# Patient Record
Sex: Male | Born: 1950 | Race: White | Hispanic: No | Marital: Married | Smoking: Never smoker
Health system: Southern US, Community
[De-identification: ages and names within clinical notes are randomized; demographics above are authoritative.]

## PROBLEM LIST (undated history)

## (undated) DIAGNOSIS — H04123 Dry eye syndrome of bilateral lacrimal glands: Secondary | ICD-10-CM

## (undated) DIAGNOSIS — I10 Essential (primary) hypertension: Secondary | ICD-10-CM

## (undated) DIAGNOSIS — K219 Gastro-esophageal reflux disease without esophagitis: Secondary | ICD-10-CM

## (undated) DIAGNOSIS — N189 Chronic kidney disease, unspecified: Secondary | ICD-10-CM

## (undated) DIAGNOSIS — E785 Hyperlipidemia, unspecified: Secondary | ICD-10-CM

## (undated) DIAGNOSIS — E559 Vitamin D deficiency, unspecified: Secondary | ICD-10-CM

## (undated) DIAGNOSIS — M1990 Unspecified osteoarthritis, unspecified site: Secondary | ICD-10-CM

## (undated) HISTORY — DX: Vitamin D deficiency, unspecified: E55.9

## (undated) HISTORY — PX: COLONOSCOPY W/ POLYPECTOMY: SHX1380

## (undated) HISTORY — DX: Hyperlipidemia, unspecified: E78.5

## (undated) HISTORY — DX: Essential (primary) hypertension: I10

## (undated) HISTORY — DX: Gastro-esophageal reflux disease without esophagitis: K21.9

---

## 1988-12-09 HISTORY — PX: ESOPHAGOGASTRODUODENOSCOPY: SHX1529

## 2003-01-05 ENCOUNTER — Ambulatory Visit (HOSPITAL_COMMUNITY): Admission: RE | Admit: 2003-01-05 | Discharge: 2003-01-05 | Payer: Self-pay | Admitting: Gastroenterology

## 2003-01-05 ENCOUNTER — Encounter (INDEPENDENT_AMBULATORY_CARE_PROVIDER_SITE_OTHER): Payer: Self-pay | Admitting: Specialist

## 2005-02-01 ENCOUNTER — Emergency Department (HOSPITAL_COMMUNITY): Admission: EM | Admit: 2005-02-01 | Discharge: 2005-02-01 | Payer: Self-pay | Admitting: Emergency Medicine

## 2005-03-08 ENCOUNTER — Ambulatory Visit (HOSPITAL_COMMUNITY): Admission: RE | Admit: 2005-03-08 | Discharge: 2005-03-08 | Payer: Self-pay | Admitting: Family Medicine

## 2008-02-24 ENCOUNTER — Ambulatory Visit (HOSPITAL_COMMUNITY): Admission: RE | Admit: 2008-02-24 | Discharge: 2008-02-24 | Payer: Self-pay | Admitting: Internal Medicine

## 2011-04-26 NOTE — Op Note (Signed)
   NAME:  [name] [name], [name] [name]                      ACCOUNT NO.:  [id number]   MEDICAL RECORD NO.:  [id number]                   PATIENT TYPE:  AMB   LOCATION:  ENDO                                 FACILITY:  [facility]   PHYSICIAN:  Danise Edge, M.D.                DATE OF BIRTH:  1951/09/28   DATE OF PROCEDURE:  01/05/2003  DATE OF DISCHARGE:                                 OPERATIVE REPORT   REFERRING PHYSICIAN:  Theressa Millard, M.D.   PROCEDURE:  Colonoscopy with polypectomy.   PROCEDURE INDICATION:  Mr. Jerrel Tiberio is a 60 year old male born  11/02/1951.  Mr. Portnoy is scheduled to undergo his first screening  colonoscopy with polypectomy to prevent colon cancer.  I discussed with Mr.  Fieldhouse the complications associated with colonoscopy and polypectomy,  including 15/1000 risk of bleeding and 1/000 risk of colon perforation.  Mr.  Landress has signed the operative permit.   ENDOSCOPIST:  Danise Edge, M.D.   PREMEDICATION:  Versed 7.5 mg, Demerol 50 mg.   ENDOSCOPE:  Olympus pediatric colonoscope.   DESCRIPTION OF PROCEDURE:  After obtaining informed consent, Mr. Renaldo  was placed in the left lateral decubitus position.  I administered  intravenous Demerol and intravenous Versed to achieve conscious sedation for  the procedure.  The patient's blood pressure, oxygen saturation, and cardiac  rhythm were monitored throughout the procedure and documented in the medical  record.   Anal inspection was normal, digital rectal exam revealed a slightly enlarged  but non-nodular prostate.  The Olympus pediatric video colonoscope was  introduced into the rectum and advanced to the cecum.  The colonic  preparation for the exam today was excellent.   Rectum normal.   Sigmoid colon and descending colon normal.   Splenic flexure normal.   Transverse colon normal.   Hepatic flexure:  From the hepatic flexure a 3-mm sessile polyp was lifted  by submucosal saline injection and  removed with both the electrocautery  snare and hot biopsy forceps.   Ascending colon normal.   Cecum and ileocecal valve normal.   ASSESSMENT:  A 3-mm sessile polyp was removed from the hepatic flexure;  otherwise normal proctocolonoscopy to the cecum.   RECOMMENDATION:  If polyp returns neoplastic, Mr. Cherne should undergo a  repeat colonoscopy in 2-5 years.                                                Danise Edge, M.D.    MJ/MEDQ  D:  01/05/2003  T:  01/05/2003  Job:  [id number]   cc:   Theressa Millard, M.D.  [address] [place]  [place] [address]  Fax: [phone]

## 2014-02-28 ENCOUNTER — Other Ambulatory Visit: Payer: Self-pay | Admitting: *Deleted

## 2014-02-28 MED ORDER — FINASTERIDE 5 MG PO TABS: 5.0000 mg | ORAL_TABLET | Freq: Every day | ORAL | Status: AC

## 2014-02-28 MED ORDER — ATENOLOL 100 MG PO TABS: 100.0000 mg | ORAL_TABLET | Freq: Every day | ORAL | Status: DC

## 2014-02-28 MED ORDER — LISINOPRIL-HYDROCHLOROTHIAZIDE 20-25 MG PO TABS: 1.0000 | ORAL_TABLET | Freq: Every day | ORAL | Status: DC

## 2014-03-22 ENCOUNTER — Encounter: Payer: Self-pay | Admitting: Physician Assistant

## 2014-03-22 ENCOUNTER — Ambulatory Visit (INDEPENDENT_AMBULATORY_CARE_PROVIDER_SITE_OTHER): Payer: BC Managed Care – PPO | Admitting: Physician Assistant

## 2014-03-22 VITALS — BP 120/80 | HR 64 | Temp 97.9°F | Resp 16 | Ht 67.0 in | Wt 159.0 lb

## 2014-03-22 DIAGNOSIS — L309 Dermatitis, unspecified: Secondary | ICD-10-CM

## 2014-03-22 DIAGNOSIS — E559 Vitamin D deficiency, unspecified: Secondary | ICD-10-CM | POA: Insufficient documentation

## 2014-03-22 DIAGNOSIS — L259 Unspecified contact dermatitis, unspecified cause: Secondary | ICD-10-CM

## 2014-03-22 DIAGNOSIS — I10 Essential (primary) hypertension: Secondary | ICD-10-CM | POA: Insufficient documentation

## 2014-03-22 DIAGNOSIS — E785 Hyperlipidemia, unspecified: Secondary | ICD-10-CM | POA: Insufficient documentation

## 2014-03-22 DIAGNOSIS — K219 Gastro-esophageal reflux disease without esophagitis: Secondary | ICD-10-CM | POA: Insufficient documentation

## 2014-03-22 MED ORDER — PREDNISONE 20 MG PO TABS
ORAL_TABLET | ORAL | Status: DC
Start: 2014-03-22 — End: 2014-03-22

## 2014-03-22 MED ORDER — TRIAMCINOLONE ACETONIDE 0.1 % EX CREA: 1.0000 "application " | TOPICAL_CREAM | Freq: Two times a day (BID) | CUTANEOUS | Status: DC

## 2014-03-22 MED ORDER — DEXAMETHASONE SODIUM PHOSPHATE 10 MG/ML IJ SOLN
10.0000 mg | Freq: Once | INTRAMUSCULAR | Status: AC
  Administered 2014-03-22: 10 mg via INTRAMUSCULAR

## 2014-03-22 MED ORDER — PREDNISONE 20 MG PO TABS: ORAL_TABLET | ORAL | Status: DC

## 2014-03-22 NOTE — Patient Instructions (Signed)
Eczema Eczema, also called atopic dermatitis, is a skin disorder that causes inflammation of the skin. It causes a red rash and dry, scaly skin. The skin becomes very itchy. Eczema is generally worse during the cooler winter months and often improves with the warmth of summer. Eczema usually starts showing signs in infancy. Some children outgrow eczema, but it may last through adulthood.  CAUSES  The exact cause of eczema is not known, but it appears to run in families. People with eczema often have a family history of eczema, allergies, asthma, or hay fever. Eczema is not contagious. Flare-ups of the condition may be caused by:   Contact with something you are sensitive or allergic to.   Stress. SIGNS AND SYMPTOMS  Dry, scaly skin.   Red, itchy rash.   Itchiness. This may occur before the skin rash and may be very intense.  DIAGNOSIS  The diagnosis of eczema is usually made based on symptoms and medical history. TREATMENT  Eczema cannot be cured, but symptoms usually can be controlled with treatment and other strategies. A treatment plan might include:  Controlling the itching and scratching.   Use over-the-counter antihistamines as directed for itching. This is especially useful at night when the itching tends to be worse.   Use over-the-counter steroid creams as directed for itching.   Avoid scratching. Scratching makes the rash and itching worse. It may also result in a skin infection (impetigo) due to a break in the skin caused by scratching.   Keeping the skin well moisturized with creams every day. This will seal in moisture and help prevent dryness. Lotions that contain alcohol and water should be avoided because they can dry the skin.   Limiting exposure to things that you are sensitive or allergic to (allergens).   Recognizing situations that cause stress.   Developing a plan to manage stress.  HOME CARE INSTRUCTIONS   Only take over-the-counter or  prescription medicines as directed by your health care provider.   Do not use anything on the skin without checking with your health care provider.   Keep baths or showers short (5 minutes) in warm (not hot) water. Use mild cleansers for bathing. These should be unscented. You may add nonperfumed bath oil to the bath water. It is best to avoid soap and bubble bath.   Immediately after a bath or shower, when the skin is still damp, apply a moisturizing ointment to the entire body. This ointment should be a petroleum ointment. This will seal in moisture and help prevent dryness. The thicker the ointment, the better. These should be unscented.   Keep fingernails cut short. Children with eczema may need to wear soft gloves or mittens at night after applying an ointment.   Dress in clothes made of cotton or cotton blends. Dress lightly, because heat increases itching.   A child with eczema should stay away from anyone with fever blisters or cold sores. The virus that causes fever blisters (herpes simplex) can cause a serious skin infection in children with eczema. SEEK MEDICAL CARE IF:   Your itching interferes with sleep.   Your rash gets worse or is not better within 1 week after starting treatment.   You see pus or soft yellow scabs in the rash area.   You have a fever.   You have a rash flare-up after contact with someone who has fever blisters.  Document Released: 11/22/2000 Document Revised: 09/15/2013 Document Reviewed: 06/28/2013 [employer] Patient Information 2014 [employer].  

## 2014-03-22 NOTE — Progress Notes (Signed)
   Subjective:    Patient ID: [name] [name], male    DOB: 10/14/1951, 63 y.o.   MRN: [id number]  Rash   63 y.o. normally gets dry skin in the winter but for the last 2-3 months he has had a rash. Started on bilateral elbows, right anterior shin and right posterior thigh. Very puritic, worse mild of the night.  History of joint pain. He has also has some fatigue recently due to allergies and some URI. Denies new soaps, being in the woods, history of rashes, new medications.    Review of Systems  Constitutional: Negative.   HENT: Negative.   Eyes: Negative.   Respiratory: Negative.   Cardiovascular: Negative.   Gastrointestinal: Negative.   Genitourinary: Negative.   Musculoskeletal: Positive for arthralgias. Negative for back pain, gait problem, joint swelling, myalgias, neck pain and neck stiffness.  Skin: Positive for rash.  Allergic/Immunologic: Positive for environmental allergies. Negative for food allergies and immunocompromised state.  Neurological: Negative.   Hematological: Negative.        Objective:   Physical Exam  Constitutional: He is oriented to person, place, and time. He appears well-developed and well-nourished.  HENT:  Head: Normocephalic and atraumatic.  Right Ear: External ear normal.  Left Ear: External ear normal.  Mouth/Throat: Oropharynx is clear and moist.  Eyes: Conjunctivae and EOM are normal. Pupils are equal, round, and reactive to light.  Neck: Normal range of motion. Neck supple.  Cardiovascular: Normal rate, regular rhythm and normal heart sounds.   Pulmonary/Chest: Effort normal and breath sounds normal.  Abdominal: Soft. Bowel sounds are normal.  Musculoskeletal: Normal range of motion.  Neurological: He is alert and oriented to person, place, and time. No cranial nerve deficit.  Skin: Skin is warm and dry.  On bilateral elbows, right anterior shin, and right posterior thigh erythematous plaques with silvery scales.   Psychiatric: He has  a normal mood and affect. His behavior is normal.       Assessment & Plan:  Dermatitis-? Eczema with recent allergies- prednisone shot and pills, triamcinolone topical, if it returns we will get a shave biopsy to evaluate for psoriasis and send to derm.

## 2014-03-27 ENCOUNTER — Encounter: Payer: Self-pay | Admitting: Internal Medicine

## 2014-03-27 DIAGNOSIS — R7309 Other abnormal glucose: Secondary | ICD-10-CM | POA: Insufficient documentation

## 2014-03-27 NOTE — Progress Notes (Signed)
Patient ID: [name] [name], male   DOB: 02/14/1951, 63 y.o.   MRN: [id number]    This very nice 63 y.o. DWM presents for 3 month follow up with Hypertension, Hyperlipidemia, Pre-Diabetes and Vitamin D Deficiency.    HTN predates since   . BP has been controlled at home. Today's BP: 106/72 mmHg . Patient denies any cardiac type chest pain, palpitations, dyspnea/orthopnea/PND, dizziness, claudication, or dependent edema.   Hyperlipidemia is controlled with diet. Last Cholesterol was 183, Triglycerides were 308, HDL 54 and LDL 67 in Oct 2014. Patient denies myalgias or other med SE's.    Also, the patient is screened for PreDiabetes/insulin resistance with last A1c of 5.1% and elevated insulin of 47 in Oct 2014 suggesting insulin resistance. Patient denies any symptoms of reactive hypoglycemia, diabetic polys, paresthesias or visual blurring.   Further, Patient has history of Vitamin D Deficiency of 28 in 2008 with last vitamin D of 81 in Oct 2014. Patient supplements vitamin D without any suspected side-effects.  Current Outpatient Prescriptions on File Prior to Visit  Medication Sig Dispense Refill  . atenolol (TENORMIN) 100 MG tablet Take 1 tablet (100 mg total) by mouth daily.  90 tablet  1  . BABY ASPIRIN PO Take 81 mg by mouth daily.      . celecoxib (CELEBREX) 200 MG capsule Take 200 mg by mouth daily.      . Cholecalciferol (VITAMIN D PO) Take by mouth daily.      . CycloSPORINE (RESTASIS OP) Apply to eye daily.      Marland Kitchen. esomeprazole (NEXIUM) 40 MG capsule Take 40 mg by mouth daily at 12 noon.      . finasteride (PROSCAR) 5 MG tablet Take 1 tablet (5 mg total) by mouth daily.  90 tablet  1  . lisinopril-hydrochlorothiazide (PRINZIDE,ZESTORETIC) 20-25 MG per tablet Take 1 tablet by mouth daily.  90 tablet  1  . Omega-3 Fatty Acids (FISH OIL PO) Take by mouth daily.      . pravastatin (PRAVACHOL) 40 MG tablet Take 40 mg by mouth daily.      . predniSONE (DELTASONE) 20 MG tablet Take one  pill two times daily for 3 days, take one pill daily for 4 days.  10 tablet  0  . triamcinolone cream (KENALOG) 0.1 % Apply 1 application topically 2 (two) times daily.  85.2 g  2  . valACYclovir (VALTREX) 500 MG tablet Take 500 mg by mouth 3 (three) times daily as needed.       No current facility-administered medications on file prior to visit.     No Known Allergies  PMHx:   Past Medical History  Diagnosis Date  . Hypertension   . Hyperlipidemia   . GERD (gastroesophageal reflux disease)   . Vitamin D deficiency     FHx:    Reviewed / unchanged  SHx:    Reviewed / unchanged   Systems Review: Constitutional: Denies fever, chills, wt changes, headaches, insomnia, fatigue, night sweats, change in appetite. Eyes: Denies redness, blurred vision, diplopia, discharge, itchy, watery eyes.  ENT: Denies discharge, congestion, post nasal drip, epistaxis, sore throat, earache, hearing loss, dental pain, tinnitus, vertigo, sinus pain, snoring.  CV: Denies chest pain, palpitations, irregular heartbeat, syncope, dyspnea, diaphoresis, orthopnea, PND, claudication, edema. Respiratory: denies cough, dyspnea, DOE, pleurisy, hoarseness, laryngitis, wheezing.  Gastrointestinal: Denies dysphagia, odynophagia, heartburn, reflux, water brash, abdominal pain or cramps, nausea, vomiting, bloating, diarrhea, constipation, hematemesis, melena, hematochezia,  or hemorrhoids. Genitourinary: Denies dysuria, frequency, urgency,  nocturia, hesitancy, discharge, hematuria, flank pain. Musculoskeletal: Denies arthralgias, myalgias, stiffness, jt. swelling, pain, limp, strain/sprain.  Skin: Denies pruritus, rash, hives, warts, acne, eczema, change in skin lesion(s). Neuro: No weakness, tremor, incoordination, spasms, paresthesia, or pain. Psychiatric: Denies confusion, memory loss, or sensory loss. Endo: Denies change in weight, skin, hair change.  Heme/Lymph: No excessive bleeding, bruising, orenlarged lymph  nodes.   Exam:  BP 106/72  Pulse 60  Temp(Src) 98.2 F (36.8 C) (Temporal)  Resp 16  Ht 5\' 7"  (1.702 m)  Wt 156 lb 6.4 oz (70.943 kg)  BMI 24.49 kg/m2  Appears well nourished - in no distress. Eyes: PERRLA, EOMs, conjunctiva no swelling or erythema. Sinuses: No frontal/maxillary tenderness ENT/Mouth: EAC's clear, TM's nl w/o erythema, bulging. Nares clear w/o erythema, swelling, exudates. Oropharynx clear without erythema or exudates. Oral hygiene is good. Tongue normal, non obstructing. Hearing intact.  Neck: Supple. Thyroid nl. Car 2+/2+ without bruits, nodes or JVD. Chest: Respirations nl with BS clear & equal w/o rales, rhonchi, wheezing or stridor.  Cor: Heart sounds normal w/ regular rate and rhythm without sig. murmurs, gallops, clicks, or rubs. Peripheral pulses normal and equal  without edema.  Abdomen: Soft & bowel sounds normal. Non-tender w/o guarding, rebound, hernias, masses, or organomegaly.  Lymphatics: Unremarkable.  Musculoskeletal: Full ROM all peripheral extremities, joint stability, 5/5 strength, and normal gait.  Skin: Warm, dry without exposed rashes, lesions, ecchymosis apparent.  Neuro: Cranial nerves intact, reflexes equal bilaterally. Sensory-motor testing grossly intact. Tendon reflexes grossly intact.  Pysch: Alert & oriented x 3. Insight and judgement nl & appropriate. No ideations.  Assessment and Plan:  1. Hypertension - Continue monitor blood pressure at home. Continue diet/meds same.  2. Hyperlipidemia - Continue diet/meds, exercise,& lifestyle modifications. Continue monitor periodic cholesterol/liver & renal functions   3. Pre-diabetes/Insulin Resistance - Continue diet, exercise, lifestyle modifications. Monitor appropriate labs.  4. Vitamin D Deficiency - Continue supplementation.  Recommended regular exercise, BP monitoring, weight control, and discussed med and SE's. Recommended labs to assess and monitor clinical status. Further  disposition pending results of labs.

## 2014-03-27 NOTE — Patient Instructions (Addendum)
Hypertension As your heart beats, it forces blood through your arteries. This force is your blood pressure. If the pressure is too high, it is called hypertension (HTN) or high blood pressure. HTN is dangerous because you may have it and not know it. High blood pressure may mean that your heart has to work harder to pump blood. Your arteries may be narrow or stiff. The extra work puts you at risk for heart disease, stroke, and other problems.  Blood pressure consists of two numbers, a higher number over a lower, 110/72, for example. It is stated as "110 over 72." The ideal is below 120 for the top number (systolic) and under 80 for the bottom (diastolic). Write down your blood pressure today. You should pay close attention to your blood pressure if you have certain conditions such as:  Heart failure.  Prior heart attack.  Diabetes  Chronic kidney disease.  Prior stroke.  Multiple risk factors for heart disease. To see if you have HTN, your blood pressure should be measured while you are seated with your arm held at the level of the heart. It should be measured at least twice. A one-time elevated blood pressure reading (especially in the Emergency Department) does not mean that you need treatment. There may be conditions in which the blood pressure is different between your right and left arms. It is important to see your caregiver soon for a recheck. Most people have essential hypertension which means that there is not a specific cause. This type of high blood pressure may be lowered by changing lifestyle factors such as:  Stress.  Smoking.  Lack of exercise.  Excessive weight.  Drug/tobacco/alcohol use.  Eating less salt. Most people do not have symptoms from high blood pressure until it has caused damage to the body. Effective treatment can often prevent, delay or reduce that damage. TREATMENT  When a cause has been identified, treatment for high blood pressure is directed at  the cause. There are a large number of medications to treat HTN. These fall into several categories, and your caregiver will help you select the medicines that are best for you. Medications may have side effects. You should review side effects with your caregiver. If your blood pressure stays high after you have made lifestyle changes or started on medicines,   Your medication(s) may need to be changed.  Other problems may need to be addressed.  Be certain you understand your prescriptions, and know how and when to take your medicine.  Be sure to follow up with your caregiver within the time frame advised (usually within two weeks) to have your blood pressure rechecked and to review your medications.  If you are taking more than one medicine to lower your blood pressure, make sure you know how and at what times they should be taken. Taking two medicines at the same time can result in blood pressure that is too low. SEEK IMMEDIATE MEDICAL CARE IF:  You develop a severe headache, blurred or changing vision, or confusion.  You have unusual weakness or numbness, or a faint feeling.  You have severe chest or abdominal pain, vomiting, or breathing problems. MAKE SURE YOU:   Understand these instructions.  Will watch your condition.  Will get help right away if you are not doing well or get worse.   Cholesterol Cholesterol is a white, waxy, fat-like protein needed by your body in small amounts. The liver makes all the cholesterol you need. It is carried from the  liver by the blood through the blood vessels. Deposits (plaque) may build up on blood vessel walls. This makes the arteries narrower and stiffer. Plaque increases the risk for heart attack and stroke. You cannot feel your cholesterol level even if it is very high. The only way to know is by a blood test to check your lipid (fats) levels. Once you know your cholesterol levels, you should keep a record of the test results. Work with your  caregiver to to keep your levels in the desired range. WHAT THE RESULTS MEAN:  Total cholesterol is a rough measure of all the cholesterol in your blood.  LDL is the so-called bad cholesterol. This is the type that deposits cholesterol in the walls of the arteries. You want this level to be low.  HDL is the good cholesterol because it cleans the arteries and carries the LDL away. You want this level to be high.  Triglycerides are fat that the body can either burn for energy or store. High levels are closely linked to heart disease. DESIRED LEVELS:  Total cholesterol below 200.  LDL below 100 for people at risk, below 70 for very high risk.  HDL above 50 is good, above 60 is best.  Triglycerides below 150. HOW TO LOWER YOUR CHOLESTEROL:  Diet.  Choose fish or white meat chicken and [place], roasted or baked. Limit fatty cuts of red meat, fried foods, and processed meats, such as sausage and lunch meat.  Eat lots of fresh fruits and vegetables. Choose whole grains, beans, pasta, potatoes and cereals.  Use only small amounts of olive, corn or canola oils. Avoid butter, mayonnaise, shortening or palm kernel oils. Avoid foods with trans-fats.  Use skim/nonfat milk and low-fat/nonfat yogurt and cheeses. Avoid whole milk, cream, ice cream, egg yolks and cheeses. Healthy desserts include angel food cake, ginger snaps, animal crackers, hard candy, popsicles, and low-fat/nonfat frozen yogurt. Avoid pastries, cakes, pies and cookies.  Exercise.  A regular program helps decrease LDL and raises HDL.  Helps with weight control.  Do things that increase your activity level like gardening, walking, or taking the stairs.  Medication.  May be prescribed by your caregiver to help lowering cholesterol and the risk for heart disease.  You may need medicine even if your levels are normal if you have several risk factors. HOME CARE INSTRUCTIONS   Follow your diet and exercise programs as  suggested by your caregiver.  Take medications as directed.  Have blood work done when your caregiver feels it is necessary. MAKE SURE YOU:   Understand these instructions.  Will watch your condition.  Will get help right away if you are not doing well or get worse.      Vitamin D Deficiency Vitamin D is an important vitamin that your body needs. Having too little of it in your body is called a deficiency. A very bad deficiency can make your bones soft and can cause a condition called rickets.  Vitamin D is important to your body for different reasons, such as:   It helps your body absorb 2 minerals called calcium and phosphorus.  It helps make your bones healthy.  It may prevent some diseases, such as diabetes and multiple sclerosis.  It helps your muscles and heart. You can get vitamin D in several ways. It is a natural part of some foods. The vitamin is also added to some dairy products and cereals. Some people take vitamin D supplements. Also, your body makes vitamin D when  you are in the sun. It changes the sun's rays into a form of the vitamin that your body can use. CAUSES   Not eating enough foods that contain vitamin D.  Not getting enough sunlight.  Having certain digestive system diseases that make it hard to absorb vitamin D. These diseases include Crohn's disease, chronic pancreatitis, and cystic fibrosis.  Having a surgery in which part of the stomach or small intestine is removed.  Being obese. Fat cells pull vitamin D out of your blood. That means that obese people may not have enough vitamin D left in their blood and in other body tissues.  Having chronic kidney or liver disease. RISK FACTORS Risk factors are things that make you more likely to develop a vitamin D deficiency. They include:  Being older.  Not being able to get outside very much.  Living in a nursing home.  Having had broken bones.  Having weak or thin bones  (osteoporosis).  Having a disease or condition that changes how your body absorbs vitamin D.  Having dark skin.  Some medicines such as seizure medicines or steroids.  Being overweight or obese. SYMPTOMS Mild cases of vitamin D deficiency may not have any symptoms. If you have a very bad case, symptoms may include:  Bone pain.  Muscle pain.  Falling often.  Broken bones caused by a minor injury, due to osteoporosis. DIAGNOSIS A blood test is the best way to tell if you have a vitamin D deficiency. TREATMENT Vitamin D deficiency can be treated in different ways. Treatment for vitamin D deficiency depends on what is causing it. Options include:  Taking vitamin D supplements.  Taking a calcium supplement. Your caregiver will suggest what dose is best for you. HOME CARE INSTRUCTIONS  Take any supplements that your caregiver prescribes. Follow the directions carefully. Take only the suggested amount.  Have your blood tested 2 months after you start taking supplements.  Eat foods that contain vitamin D. Healthy choices include:  Fortified dairy products, cereals, or juices. Fortified means vitamin D has been added to the food. Check the label on the package to be sure.  Fatty fish like salmon or trout.  Eggs.  Oysters.  Do not use a tanning bed.  Keep your weight at a healthy level. Lose weight if you need to.  Keep all follow-up appointments. Your caregiver will need to perform blood tests to make sure your vitamin D deficiency is going away. SEEK MEDICAL CARE IF:  You have any questions about your treatment.  You continue to have symptoms of vitamin D deficiency.  You have nausea or vomiting.  You are constipated.  You feel confused.  You have severe abdominal or back pain. MAKE SURE YOU:  Understand these instructions.  Will watch your condition.  Will get help right away if you are not doing well or get worse.     Insulin Resistance Blood sugar  (glucose) levels are controlled by a hormone called insulin. Insulin is made by your pancreas. When your blood glucose goes up, insulin is released into your blood. Insulin is required for your body to function normally. However, your body can become resistant to your own insulin or to insulin given to treat diabetes. In either case, insulin resistance can lead to serious problems. These problems include:  Type 2 diabetes.  Heart disease.  High blood pressure.  Stroke.  Polycystic ovary syndrome.  Fatty liver. CAUSES  Insulin resistance can develop for many different reasons. It is  more likely to happen in people with these conditions or characteristics:  Obesity.  Inactivity.  Pregnancy.  High blood pressure.  Stress.  Steroid use.  Infection or severe illness.  Increased levels of cholesterol and triglycerides. SYMPTOMS  There are no symptoms. You may have symptoms related to the various complications of insulin resistance.  DIAGNOSIS  Several different things can make your caregiver suspect you have insulin resistance. These include:  High blood glucose (hyperglycemia).  Abnormal cholesterol levels.  High uric acid levels.  Changes related to blood pressure.  Changes related to inflammation. Insulin resistance can be determined with blood tests. An elevated insulin level when you have not eaten might suggest resistance. Other more complicated tests are sometimes necessary. TREATMENT  Lifestyle changes are the most important treatment for insulin resistance.   If you are overweight and you have insulin resistance, you can improve your insulin sensitivity by losing weight.  Moderate exercise for 30 40 minutes, 4 days a week, can improve insulin sensitivity. Some medicines can also help improve your insulin sensitivity. Your caregiver can discuss these with you if they are appropriate.  HOME CARE INSTRUCTIONS   Do not smoke.  Keep your weight at a healthy  level.  Get exercise.  If you have diabetes, follow your caregiver's directions.  If you have high blood pressure, follow your caregiver's directions.  Only take prescription medicines for pain, fever, or discomfort as directed by your caregiver. SEEK MEDICAL CARE IF:   You are diabetic and you are having problems keeping your blood glucose levels at target range.  You are having episodes of low blood glucose (hypoglycemia).  You feel you might be having side effects from your medicines.  You have symptoms of an illness that is not improving after 3 4 days.  You have a sore or wound that is not healing.  You notice a change in vision or a new problem with your vision. SEEK IMMEDIATE MEDICAL CARE IF:   Your blood glucose goes below 70, especially if you have confusion, lightheadedness, or other symptoms with it.  Your blood glucose is very high (as advised by your caregiver) twice in a row.  You pass out.  You have chest pain or trouble breathing.  You have a sudden, severe headache.  You have sudden weakness in one arm or one leg.  You have sudden difficulty speaking or swallowing.  You develop vomiting or diarrhea that is getting worse or not improving after 1 day. Document Released: 01/14/2006 Document Revised: 05/26/2012 Document Reviewed: 05/06/2013 [facility]Exit[facility] Patient Information 2014 [place]Care, [place].

## 2014-03-28 ENCOUNTER — Other Ambulatory Visit: Payer: Self-pay | Admitting: Internal Medicine

## 2014-03-28 ENCOUNTER — Ambulatory Visit (INDEPENDENT_AMBULATORY_CARE_PROVIDER_SITE_OTHER): Payer: BC Managed Care – PPO | Admitting: Internal Medicine

## 2014-03-28 ENCOUNTER — Encounter: Payer: Self-pay | Admitting: Internal Medicine

## 2014-03-28 VITALS — BP 106/72 | HR 60 | Temp 98.2°F | Resp 16 | Ht 67.0 in | Wt 156.4 lb

## 2014-03-28 DIAGNOSIS — R7309 Other abnormal glucose: Secondary | ICD-10-CM

## 2014-03-28 DIAGNOSIS — E559 Vitamin D deficiency, unspecified: Secondary | ICD-10-CM

## 2014-03-28 DIAGNOSIS — I10 Essential (primary) hypertension: Secondary | ICD-10-CM

## 2014-03-28 DIAGNOSIS — E782 Mixed hyperlipidemia: Secondary | ICD-10-CM

## 2014-03-28 DIAGNOSIS — Z79899 Other long term (current) drug therapy: Secondary | ICD-10-CM

## 2014-03-28 DIAGNOSIS — E785 Hyperlipidemia, unspecified: Secondary | ICD-10-CM

## 2014-04-01 ENCOUNTER — Other Ambulatory Visit: Payer: BC Managed Care – PPO

## 2014-04-01 DIAGNOSIS — Z79899 Other long term (current) drug therapy: Secondary | ICD-10-CM

## 2014-04-01 DIAGNOSIS — E782 Mixed hyperlipidemia: Secondary | ICD-10-CM

## 2014-04-01 DIAGNOSIS — R7309 Other abnormal glucose: Secondary | ICD-10-CM

## 2014-04-01 DIAGNOSIS — E559 Vitamin D deficiency, unspecified: Secondary | ICD-10-CM

## 2014-04-01 DIAGNOSIS — I10 Essential (primary) hypertension: Secondary | ICD-10-CM

## 2014-04-01 LAB — HEPATIC FUNCTION PANEL
ALT: 31 U/L (ref 0–53)
AST: 22 U/L (ref 0–37)
Albumin: 4.1 g/dL (ref 3.5–5.2)
Alkaline Phosphatase: 45 U/L (ref 39–117)
Bilirubin, Direct: 0.1 mg/dL (ref 0.0–0.3)
Indirect Bilirubin: 0.6 mg/dL (ref 0.2–1.2)
Total Bilirubin: 0.7 mg/dL (ref 0.2–1.2)
Total Protein: 6.6 g/dL (ref 6.0–8.3)

## 2014-04-01 LAB — CBC WITH DIFFERENTIAL/PLATELET
Basophils Absolute: 0.1 10*3/uL (ref 0.0–0.1)
Basophils Relative: 1 % (ref 0–1)
Eosinophils Absolute: 0.2 10*3/uL (ref 0.0–0.7)
Eosinophils Relative: 2 % (ref 0–5)
HCT: 46.8 % (ref 39.0–52.0)
Hemoglobin: 16.7 g/dL (ref 13.0–17.0)
Lymphocytes Relative: 26 % (ref 12–46)
Lymphs Abs: 2.8 10*3/uL (ref 0.7–4.0)
MCH: 33.3 pg (ref 26.0–34.0)
MCHC: 35.7 g/dL (ref 30.0–36.0)
MCV: 93.2 fL (ref 78.0–100.0)
Monocytes Absolute: 1.3 10*3/uL — ABNORMAL HIGH (ref 0.1–1.0)
Monocytes Relative: 12 % (ref 3–12)
Neutro Abs: 6.3 10*3/uL (ref 1.7–7.7)
Neutrophils Relative %: 59 % (ref 43–77)
Platelets: 279 10*3/uL (ref 150–400)
RBC: 5.02 MIL/uL (ref 4.22–5.81)
RDW: 12.7 % (ref 11.5–15.5)
WBC: 10.6 10*3/uL — ABNORMAL HIGH (ref 4.0–10.5)

## 2014-04-01 LAB — LIPID PANEL
Cholesterol: 190 mg/dL (ref 0–200)
HDL: 49 mg/dL (ref >39)
LDL Cholesterol: 88 mg/dL (ref 0–99)
Total CHOL/HDL Ratio: 3.9 Ratio
Triglycerides: 267 mg/dL — ABNORMAL HIGH (ref <150)
VLDL: 53 mg/dL — ABNORMAL HIGH (ref 0–40)

## 2014-04-01 LAB — TSH: TSH: 1.871 u[IU]/mL (ref 0.350–4.500)

## 2014-04-01 LAB — BASIC METABOLIC PANEL WITH GFR
BUN: 27 mg/dL — ABNORMAL HIGH (ref 6–23)
CO2: 30 mEq/L (ref 19–32)
Calcium: 9.5 mg/dL (ref 8.4–10.5)
Chloride: 96 mEq/L (ref 96–112)
Creat: 1.22 mg/dL (ref 0.50–1.35)
GFR, Est African American: 74 mL/min
GFR, Est Non African American: 64 mL/min
Glucose, Bld: 82 mg/dL (ref 70–99)
Potassium: 4.4 mEq/L (ref 3.5–5.3)
Sodium: 135 mEq/L (ref 135–145)

## 2014-04-01 LAB — MAGNESIUM: Magnesium: 2.2 mg/dL (ref 1.5–2.5)

## 2014-04-02 LAB — VITAMIN D 25 HYDROXY (VIT D DEFICIENCY, FRACTURES): Vit D, 25-Hydroxy: 79 ng/mL (ref 30–89)

## 2014-04-02 LAB — HEMOGLOBIN A1C
Hgb A1c MFr Bld: 5.4 % (ref <5.7)
Mean Plasma Glucose: 108 mg/dL (ref <117)

## 2014-04-02 LAB — INSULIN, FASTING: Insulin fasting, serum: 9 u[IU]/mL (ref 3–28)

## 2014-04-04 ENCOUNTER — Other Ambulatory Visit: Payer: Self-pay | Admitting: *Deleted

## 2014-04-04 MED ORDER — ESOMEPRAZOLE MAGNESIUM 40 MG PO CPDR: 40.0000 mg | DELAYED_RELEASE_CAPSULE | Freq: Every day | ORAL | Status: DC

## 2014-05-19 ENCOUNTER — Ambulatory Visit (INDEPENDENT_AMBULATORY_CARE_PROVIDER_SITE_OTHER): Payer: BC Managed Care – PPO | Admitting: Internal Medicine

## 2014-05-19 ENCOUNTER — Encounter: Payer: Self-pay | Admitting: Internal Medicine

## 2014-05-19 VITALS — BP 106/68 | HR 60 | Temp 97.9°F | Resp 16 | Ht 67.0 in | Wt 159.4 lb

## 2014-05-19 DIAGNOSIS — B354 Tinea corporis: Secondary | ICD-10-CM

## 2014-05-19 MED ORDER — KETOCONAZOLE 2 % EX CREA: TOPICAL_CREAM | CUTANEOUS | Status: DC

## 2014-05-19 NOTE — Patient Instructions (Signed)
Body Ringworm °Ringworm (tinea corporis) is a fungal infection of the skin on the body. This infection is not caused by worms, but is actually caused by a fungus. Fungus normally lives on the top of your skin and can be useful. However, in the case of ringworms, the fungus grows out of control and causes a skin infection. It can involve any area of skin on the body and can spread easily from one person to another (contagious). Ringworm is a common problem for children, but it can affect adults as well. Ringworm is also often found in athletes, especially wrestlers who share equipment and mats.  °CAUSES  °Ringworm of the body is caused by a fungus called dermatophyte. It can spread by: °· Touching other people who are infected. °· Touching infected pets. °· Touching or sharing objects that have been in contact with the infected person or pet (hats, combs, towels, clothing, sports equipment). °SYMPTOMS  °· Itchy, raised red spots and bumps on the skin. °· Ring-shaped rash. °· Redness near the border of the rash with a clear center. °· Dry and scaly skin on or around the rash. °Not every person develops a ring-shaped rash. Some develop only the red, scaly patches. °DIAGNOSIS  °Most often, ringworm can be diagnosed by performing a skin exam. Your caregiver may choose to take a skin scraping from the affected area. The sample will be examined under the microscope to see if the fungus is present.  °TREATMENT  °Body ringworm may be treated with a topical antifungal cream or ointment. Sometimes, an antifungal shampoo that can be used on your body is prescribed. You may be prescribed antifungal medicines to take by mouth if your ringworm is severe, keeps coming back, or lasts a long time.  °HOME CARE INSTRUCTIONS  °· Only take over-the-counter or prescription medicines as directed by your caregiver. °· Wash the infected area and dry it completely before applying your cream or ointment. °· When using antifungal shampoo to  treat the ringworm, leave the shampoo on the body for 3 5 minutes before rinsing.    °· Wear loose clothing to stop clothes from rubbing and irritating the rash. °· Wash or change your bed sheets every night while you have the rash. °· Have your pet treated by your veterinarian if it has the same infection. °To prevent ringworm:  °· Practice good hygiene. °· Wear sandals or shoes in public places and showers. °· Do not share personal items with others. °· Avoid touching red patches of skin on other people. °· Avoid touching pets that have bald spots or wash your hands after doing so. °SEEK MEDICAL CARE IF:  °· Your rash continues to spread after 7 days of treatment. °· Your rash is not gone in 4 weeks. °· The area around your rash becomes red, warm, tender, and swollen. °Document Released: 11/22/2000 Document Revised: 08/19/2012 Document Reviewed: 06/08/2012 °[employer]® Patient Information ©2014 [employer]. ° °

## 2014-05-19 NOTE — Progress Notes (Signed)
Subjective:     Patient ID: [name] [name], male   DOB: [birth year], 63 y.o.   MRN: [id number]  HPI Presents with c/o rash on elbows which improved with a steroid cream, but persists with a circumscribed pruritic erythematous rash of the upper Rt chest.    Medication List   atenolol 100 MG tablet  Commonly known as:  TENORMIN  Take 1 tablet (100 mg total) by mouth daily.     BABY ASPIRIN PO  Take 81 mg by mouth daily.     celecoxib 200 MG capsule  Commonly known as:  CELEBREX  Take 200 mg by mouth daily.     esomeprazole 40 MG capsule  Commonly known as:  NEXIUM  Take 1 capsule (40 mg total) by mouth daily at 12 noon.     finasteride 5 MG tablet  Commonly known as:  PROSCAR  Take 1 tablet (5 mg total) by mouth daily.     FISH OIL PO  Take 1,200 mg by mouth daily.     ketoconazole 2 % cream  Commonly known as:  NIZORAL  Apply sparingly to rash 2-3 x daily     lisinopril-hydrochlorothiazide 20-25 MG per tablet  Commonly known as:  PRINZIDE,ZESTORETIC  Take 1 tablet by mouth daily.     pravastatin 40 MG tablet  Commonly known as:  PRAVACHOL  Take 40 mg by mouth daily.     RESTASIS OP  Apply to eye daily.     triamcinolone cream 0.1 %  Commonly known as:  KENALOG  Apply 1 application topically 2 (two) times daily.     valACYclovir 500 MG tablet  Commonly known as:  VALTREX  Take 500 mg by mouth 3 (three) times daily as needed.     VITAMIN D PO  Take 5,000 Units by mouth daily.       No Known Allergies Past Medical History  Diagnosis Date  . Hypertension   . Hyperlipidemia   . GERD (gastroesophageal reflux disease)   . Vitamin D deficiency    Review of Systems neg to above  Objective:   Physical Exam  Exam focused on the skin finds minimal dr rash of elbows. There is an erythematous 1.5 x 2.0 " rash of the upper Rt anterior chest suspect for a tinea type rash. Assessment:     1. Dermatophytosis of the body  Plan:     Rx: Nizoral 2% Crm x 30 gm x  3 rf - apply sparingly 2-3 x day

## 2014-05-19 NOTE — Progress Notes (Deleted)
Patient ID: [name] [name], male   DOB: 01-30-1951, 63 y.o.   MRN: [id number]

## 2014-05-28 ENCOUNTER — Other Ambulatory Visit: Payer: Self-pay | Admitting: Internal Medicine

## 2014-05-30 ENCOUNTER — Other Ambulatory Visit: Payer: Self-pay

## 2014-05-30 MED ORDER — CELECOXIB 200 MG PO CAPS: 200.0000 mg | ORAL_CAPSULE | Freq: Every day | ORAL | Status: DC

## 2014-06-21 ENCOUNTER — Other Ambulatory Visit: Payer: Self-pay | Admitting: *Deleted

## 2014-06-21 MED ORDER — ZOLPIDEM TARTRATE 5 MG PO TABS: 5.0000 mg | ORAL_TABLET | Freq: Every evening | ORAL | Status: DC | PRN

## 2014-08-02 ENCOUNTER — Other Ambulatory Visit: Payer: Self-pay | Admitting: Internal Medicine

## 2014-08-02 ENCOUNTER — Other Ambulatory Visit: Payer: Self-pay | Admitting: *Deleted

## 2014-08-02 ENCOUNTER — Other Ambulatory Visit: Payer: Self-pay | Admitting: Physician Assistant

## 2014-08-02 MED ORDER — PREDNISONE 20 MG PO TABS
ORAL_TABLET | ORAL | Status: DC
Start: 2014-08-02 — End: 2014-08-17

## 2014-08-17 ENCOUNTER — Ambulatory Visit (HOSPITAL_COMMUNITY)
Admission: RE | Admit: 2014-08-17 | Discharge: 2014-08-17 | Disposition: A | Discharge status: Home / Self Care | Payer: BC Managed Care – PPO | Source: Ambulatory Visit | Attending: Internal Medicine | Admitting: Internal Medicine

## 2014-08-17 ENCOUNTER — Encounter: Payer: Self-pay | Admitting: Internal Medicine

## 2014-08-17 ENCOUNTER — Ambulatory Visit (INDEPENDENT_AMBULATORY_CARE_PROVIDER_SITE_OTHER): Payer: BC Managed Care – PPO | Admitting: Internal Medicine

## 2014-08-17 VITALS — BP 116/84 | HR 60 | Temp 97.7°F | Resp 16 | Ht 67.0 in | Wt 157.0 lb

## 2014-08-17 DIAGNOSIS — M25559 Pain in unspecified hip: Secondary | ICD-10-CM | POA: Diagnosis present

## 2014-08-17 DIAGNOSIS — M545 Low back pain, unspecified: Secondary | ICD-10-CM

## 2014-08-17 DIAGNOSIS — M5432 Sciatica, left side: Secondary | ICD-10-CM

## 2014-08-17 DIAGNOSIS — M5430 Sciatica, unspecified side: Secondary | ICD-10-CM

## 2014-08-17 MED ORDER — HYDROCODONE-ACETAMINOPHEN 5-325 MG PO TABS: ORAL_TABLET | ORAL | Status: DC

## 2014-08-17 MED ORDER — PREDNISONE 20 MG PO TABS: ORAL_TABLET | ORAL | Status: DC

## 2014-08-17 NOTE — Patient Instructions (Signed)
Radicular Pain  Radicular pain in either the arm or leg is usually from a bulging or herniated disk in the spine. A piece of the herniated disk may press against the nerves as the nerves exit the spine. This causes pain which is felt at the tips of the nerves down the arm or leg. Other causes of radicular pain may include:   Fractures.   Heart disease.   Cancer.   An abnormal and usually degenerative state of the nervous system or nerves (neuropathy).  Diagnosis may require CT or MRI scanning to determine the primary cause.   Nerves that start at the neck (nerve roots) may cause radicular pain in the outer shoulder and arm. It can spread down to the thumb and fingers. The symptoms vary depending on which nerve root has been affected. In most cases radicular pain improves with conservative treatment. Neck problems may require physical therapy, a neck collar, or cervical traction. Treatment may take many weeks, and surgery may be considered if the symptoms do not improve.   Conservative treatment is also recommended for sciatica. Sciatica causes pain to radiate from the lower back or buttock area down the leg into the foot. Often there is a history of back problems. Most patients with sciatica are better after 2 to 4 weeks of rest and other supportive care. Short term bed rest can reduce the disk pressure considerably. Sitting, however, is not a good position since this increases the pressure on the disk. You should avoid bending, lifting, and all other activities which make the problem worse. Traction can be used in severe cases. Surgery is usually reserved for patients who do not improve within the first months of treatment.  Only take over-the-counter or prescription medicines for pain, discomfort, or fever as directed by your caregiver. Narcotics and muscle relaxants may help by relieving more severe pain and spasm and by providing mild sedation. Cold or massage can give significant relief. Spinal manipulation  is not recommended. It can increase the degree of disc protrusion. Epidural steroid injections are often effective treatment for radicular pain. These injections deliver medicine to the spinal nerve in the space between the protective covering of the spinal cord and back bones (vertebrae). Your caregiver can give you more information about steroid injections. These injections are most effective when given within two weeks of the onset of pain.   You should see your caregiver for follow up care as recommended. A program for neck and back injury rehabilitation with stretching and strengthening exercises is an important part of management.   SEEK IMMEDIATE MEDICAL CARE IF:   You develop increased pain, weakness, or numbness in your arm or leg.   You develop difficulty with bladder or bowel control.   You develop abdominal pain.  Document Released: 01/02/2005 Document Revised: 02/17/2012 Document Reviewed: 03/20/2009  [employer] Patient Information 2015 [employer]. This information is not intended to replace advice given to you by your health care provider. Make sure you discuss any questions you have with your health care provider.    Sciatica  Sciatica is pain, weakness, numbness, or tingling along the path of the sciatic nerve. The nerve starts in the lower back and runs down the back of each leg. The nerve controls the muscles in the lower leg and in the back of the knee, while also providing sensation to the back of the thigh, lower leg, and the sole of your foot. Sciatica is a symptom of another medical condition. For instance, nerve   damage or certain conditions, such as a herniated disk or bone spur on the spine, pinch or put pressure on the sciatic nerve. This causes the pain, weakness, or other sensations normally associated with sciatica. Generally, sciatica only affects one side of the body.  CAUSES    Herniated or slipped disc.   Degenerative disk disease.   A pain disorder involving the narrow  muscle in the buttocks (piriformis syndrome).   Pelvic injury or fracture.   Pregnancy.   Tumor (rare).  SYMPTOMS   Symptoms can vary from mild to very severe. The symptoms usually travel from the low back to the buttocks and down the back of the leg. Symptoms can include:   Mild tingling or dull aches in the lower back, leg, or hip.   Numbness in the back of the calf or sole of the foot.   Burning sensations in the lower back, leg, or hip.   Sharp pains in the lower back, leg, or hip.   Leg weakness.   Severe back pain inhibiting movement.  These symptoms may get worse with coughing, sneezing, laughing, or prolonged sitting or standing. Also, being overweight may worsen symptoms.  DIAGNOSIS   Your caregiver will perform a physical exam to look for common symptoms of sciatica. He or she may ask you to do certain movements or activities that would trigger sciatic nerve pain. Other tests may be performed to find the cause of the sciatica. These may include:   Blood tests.   X-rays.   Imaging tests, such as an MRI or CT scan.  TREATMENT   Treatment is directed at the cause of the sciatic pain. Sometimes, treatment is not necessary and the pain and discomfort goes away on its own. If treatment is needed, your caregiver may suggest:   Over-the-counter medicines to relieve pain.   Prescription medicines, such as anti-inflammatory medicine, muscle relaxants, or narcotics.   Applying heat or ice to the painful area.   Steroid injections to lessen pain, irritation, and inflammation around the nerve.   Reducing activity during periods of pain.   Exercising and stretching to strengthen your abdomen and improve flexibility of your spine. Your caregiver may suggest losing weight if the extra weight makes the back pain worse.   Physical therapy.   Surgery to eliminate what is pressing or pinching the nerve, such as a bone spur or part of a herniated disk.  HOME CARE INSTRUCTIONS    Only take over-the-counter  or prescription medicines for pain or discomfort as directed by your caregiver.   Apply ice to the affected area for 20 minutes, 3-4 times a day for the first 48-72 hours. Then try heat in the same way.   Exercise, stretch, or perform your usual activities if these do not aggravate your pain.   Attend physical therapy sessions as directed by your caregiver.   Keep all follow-up appointments as directed by your caregiver.   Do not wear high heels or shoes that do not provide proper support.   Check your mattress to see if it is too soft. A firm mattress may lessen your pain and discomfort.  SEEK IMMEDIATE MEDICAL CARE IF:    You lose control of your bowel or bladder (incontinence).   You have increasing weakness in the lower back, pelvis, buttocks, or legs.   You have redness or swelling of your back.   You have a burning sensation when you urinate.   You have pain that gets worse when you   lie down or awakens you at night.   Your pain is worse than you have experienced in the past.   Your pain is lasting longer than 4 weeks.   You are suddenly losing weight without reason.  MAKE SURE YOU:   Understand these instructions.   Will watch your condition.   Will get help right away if you are not doing well or get worse.  Document Released: 11/19/2001 Document Revised: 05/26/2012 Document Reviewed: 04/05/2012  [employer] Patient Information 2015 [employer]. This information is not intended to replace advice given to you by your health care provider. Make sure you discuss any questions you have with your health care provider.

## 2014-08-17 NOTE — Progress Notes (Signed)
Subjective:    Patient ID: [name] [name], male    DOB: 1951-07-02, 63 y.o.   MRN: [id number]  HPI Patient completed an 11 day taper of prednisone yesterday for LBB with Lt sciatica which he describes circumferentially around the proximal to mid thigh and in consistently worsening with activity. He feels significantly improved, but not resolved.  He is scheduled for a 1 week business trip to [place] in about a week and a 1 week vacation in [place] to follow. He is concerned about an exacerbation of Sx's during his trip.    Medication List   ALREX 0.2 % Susp  Generic drug:  loteprednol     atenolol 100 MG tablet  Commonly known as:  TENORMIN  Take 1 tablet (100 mg total) by mouth daily.     augmented betamethasone dipropionate 0.05 % cream  Commonly known as:  DIPROLENE-AF  PRN     BABY ASPIRIN PO  Take 81 mg by mouth daily.     celecoxib 200 MG capsule  Commonly known as:  CELEBREX  Take 1 capsule (200 mg total) by mouth daily.     esomeprazole 40 MG capsule  Commonly known as:  NEXIUM  Take 1 capsule (40 mg total) by mouth daily at 12 noon.     finasteride 5 MG tablet  Commonly known as:  PROSCAR  Take 1 tablet (5 mg total) by mouth daily.     FISH OIL PO  Take 1,200 mg by mouth daily.     ketoconazole 2 % cream  Commonly known as:  NIZORAL  daily as needed. Apply sparingly to rash 2-3 x daily     lisinopril-hydrochlorothiazide 20-25 MG per tablet  Commonly known as:  PRINZIDE,ZESTORETIC  Take 1 tablet by mouth daily.     pravastatin 40 MG tablet  Commonly known as:  PRAVACHOL  TAKE 1 BY MOUTH AT BEDTIME FOR CHOLESTEROL     RESTASIS OP  Apply to eye daily.     triamcinolone cream 0.1 %  Commonly known as:  KENALOG  Apply 1 application topically 2 (two) times daily. prn     valACYclovir 500 MG tablet  Commonly known as:  VALTREX  Take 500 mg by mouth 3 (three) times daily as needed.     VITAMIN D PO  Take 5,000 Units by mouth daily.     zolpidem 5 MG  tablet  Commonly known as:  AMBIEN  Take 1 tablet (5 mg total) by mouth at bedtime as needed for sleep.     No Known Allergies  Past Medical History  Diagnosis Date  . Hypertension   . Hyperlipidemia   . GERD (gastroesophageal reflux disease)   . Vitamin D deficiency    Review of Systems Non contributory to above.     Objective:   Physical Exam  BP 116/84  Pulse 60  Temp(Src) 97.7 F (36.5 C) (Temporal)  Resp 16  Ht  (1.702 m)  Wt 157 lb (71.215 kg)  BMI 24.58 kg/m2  Exam shows normal gait. Bilat hip ROM is nl w/o pain. SLR is negative bilat. No appreciable Paralumbar spasm at present.   Assessment & Plan:   1. Left low back pain, with sciatica  - DG Hip Complete Right; Standing - DG Hip Complete Right - DG Hip Complete Left; Future  2. Sciatica, left   Patient given Rx asbelow to take on his trip and discussed proper biomechanic precautions for LBP.   - predniSONE (DELTASONE) 20  MG tablet; 1 tab 3 x day for 3 days, then 1 tab 2 x day for 3 days, then 1 tab 1 x day for 5 days  Dispense: 20 tablet; Refill: 0 - HYDROcodone-acetaminophen (NORCO) 5-325 MG per tablet; Take 1/2 to 1 or 2 tabs every 4 hours if needed  for pain  Dispense: 50 tablet; Refill: 0 - Discussed Med effect/SEs.

## 2014-09-08 ENCOUNTER — Other Ambulatory Visit: Payer: Self-pay

## 2014-09-08 ENCOUNTER — Ambulatory Visit: Payer: Self-pay | Admitting: Internal Medicine

## 2014-09-08 DIAGNOSIS — M5442 Lumbago with sciatica, left side: Secondary | ICD-10-CM

## 2014-09-08 DIAGNOSIS — M5126 Other intervertebral disc displacement, lumbar region: Secondary | ICD-10-CM

## 2014-09-12 ENCOUNTER — Ambulatory Visit (INDEPENDENT_AMBULATORY_CARE_PROVIDER_SITE_OTHER): Payer: BC Managed Care – PPO | Admitting: Internal Medicine

## 2014-09-12 ENCOUNTER — Encounter: Payer: Self-pay | Admitting: Internal Medicine

## 2014-09-12 ENCOUNTER — Ambulatory Visit
Admission: RE | Admit: 2014-09-12 | Discharge: 2014-09-12 | Disposition: A | Discharge status: Home / Self Care | Payer: BC Managed Care – PPO | Source: Ambulatory Visit | Attending: Internal Medicine | Admitting: Internal Medicine

## 2014-09-12 VITALS — BP 92/60 | HR 72 | Temp 97.3°F | Resp 16 | Ht 67.0 in | Wt 153.6 lb

## 2014-09-12 DIAGNOSIS — Z79899 Other long term (current) drug therapy: Secondary | ICD-10-CM | POA: Insufficient documentation

## 2014-09-12 DIAGNOSIS — K529 Noninfective gastroenteritis and colitis, unspecified: Secondary | ICD-10-CM

## 2014-09-12 DIAGNOSIS — M5432 Sciatica, left side: Secondary | ICD-10-CM

## 2014-09-12 DIAGNOSIS — M5442 Lumbago with sciatica, left side: Secondary | ICD-10-CM

## 2014-09-12 DIAGNOSIS — M5126 Other intervertebral disc displacement, lumbar region: Secondary | ICD-10-CM

## 2014-09-12 MED ORDER — CIPROFLOXACIN HCL 500 MG PO TABS: 500.0000 mg | ORAL_TABLET | Freq: Two times a day (BID) | ORAL | Status: DC

## 2014-09-12 MED ORDER — METRONIDAZOLE 500 MG PO TABS: 500.0000 mg | ORAL_TABLET | Freq: Three times a day (TID) | ORAL | Status: DC

## 2014-09-12 MED ORDER — HYOSCYAMINE SULFATE 0.125 MG PO TABS: ORAL_TABLET | ORAL | Status: DC

## 2014-09-12 NOTE — Patient Instructions (Signed)
Gastroenteritis  Gastroenteritis occurs when certain bacteria infect the intestines. People usually begin to feel ill within 72 hours after the infection occurs. The illness can last from 2 days to 2 weeks. Elderly and immunocompromised people are at the greatest risk of this infection. Most people recover completely. However, bacteria can spread from the intestines to the blood and other parts of the body. In rare cases, a person may develop reactive arthritis with pain in the joints, irritation of the eyes, and painful urination. CAUSES  Gastroenteritis usually occurs after eating food or drinking liquids that are contaminated with salmonella bacteria. Common causes of this contamination include:  Poor personal hygiene.  Poor kitchen hygiene.  Drinking polluted, standing water.  Contact with carriers of the bacteria. Reptiles are strongly associated with the bacteria, but other animals may carry the bacteria as well. SIGNS AND SYMPTOMS   Nausea.  Vomiting.  Abdominal pain or cramps.  Diarrhea, which may be bloody.  Fever.  Headache. DIAGNOSIS  Your health care provider will take your medical history and perform a physical exam. A blood or stool sample may also be taken and tested for the presence of salmonella bacteria. TREATMENT  Often, no treatment is needed. However, you will need to drink plenty of fluids to prevent dehydration. In severe cases, antibiotic medicines may be given to help shorten the illness. HOME CARE INSTRUCTIONS  Drink enough fluids to keep your urine clear or pale yellow. Until your diarrhea, nausea, or vomiting is under control, you should only drink clear liquids. Clear liquids are anything you can see through, such as water, broth, or non-caffeinated tea. Avoid:  Milk.  Fruit juice.  Alcohol.  Extremely hot or cold fluids.  If you do not have an appetite, do not force yourself to eat. However, you must continue to drink fluids.  If you have an  appetite, eat a normal diet unless your health care provider tells you differently.  Eat a variety of complex carbohydrates (rice, wheat, potatoes, bread), lean meats, yogurt, fruits, and vegetables.  Avoid high-fat foods because they are more difficult to digest.  If you are dehydrated, ask your health care provider for specific rehydration instructions. Signs of dehydration may include:  Severe thirst.  Dry lips and mouth.  Dizziness.  Dark urine.  Decreasing urine frequency and amount.  Confusion.  Rapid breathing or pulse.  If you were prescribed an antibiotic medicine, finish it all even if you start to feel better.  Take medicines only as directed by your health care provider. Antidiarrheal medicines are not recommended.  Keep all follow-up visits as directed by your health care provider. PREVENTION  To prevent future salmonella infections:  Handle meat, eggs, seafood, and poultry properly.  Wash your hands and counters thoroughly after handling or preparing meat, eggs, seafood, and poultry.  Always cook meat, eggs, seafood, and poultry thoroughly.  Wash your hands thoroughly after handling animals. SEEK IMMEDIATE MEDICAL CARE IF:   You are unable to keep fluids down.  You have persistent vomiting or diarrhea.  You have abdominal pain that increases or is concentrated in one small area (localized).  Your diarrhea contains increased blood or mucus.  You feel very weak, dizzy, thirsty, or you faint.  You lose a significant amount of weight. Your health care provider can tell you how much weight loss should concern you.  You have a fever. MAKE SURE YOU:   Understand these instructions.  Will watch your condition.  Will get help right away if  you are not doing well or get worse.

## 2014-09-12 NOTE — Progress Notes (Signed)
Subjective:    Patient ID: [name] [name], male    DOB: 1951/04/02, 63 y.o.   MRN: [id number]  HPI Patient presents with a 4-5 da hx of Eg discomfort and diarrhea. Patient returned from a trip to [place] ~ 2 weeks ago. He denies rashes, fever chill, nite sweat, blood in BM's. Has tried Imodium Liq. w/o success.   Also, patient recently treated x 2 with Steroid pulse and altho improved still apparently has considerable pain and Lt sciatica. Patient completed Lumbar MRI this am which showed bulging disks and possible nerve impingement.    Medication List   atenolol 100 MG tablet  Commonly known as:  TENORMIN  Take 1 tablet (100 mg total) by mouth daily.     augmented betamethasone dipropionate 0.05 % cream  Commonly known as:  DIPROLENE-AF  PRN     BABY ASPIRIN PO  Take 81 mg by mouth daily.     celecoxib 200 MG capsule  Commonly known as:  CELEBREX  Take 1 capsule (200 mg total) by mouth daily.     esomeprazole 40 MG capsule  Commonly known as:  NEXIUM  Take 1 capsule (40 mg total) by mouth daily at 12 noon.     finasteride 5 MG tablet  Commonly known as:  PROSCAR  Take 1 tablet (5 mg total) by mouth daily.     FISH OIL PO  Take 1,200 mg by mouth daily.     HYDROcodone-acetaminophen 5-325 MG per tablet  Commonly known as:  NORCO  Take 1/2 to 1 or 2 tabs every 4 hours if needed for pain     ketoconazole 2 % cream  Commonly known as:  NIZORAL  daily as needed. Apply sparingly to rash 2-3 x daily     lisinopril-hydrochlorothiazide 20-25 MG per tablet  Commonly known as:  PRINZIDE,ZESTORETIC  Take 1 tablet by mouth daily.     pravastatin 40 MG tablet  Commonly known as:  PRAVACHOL  TAKE 1 BY MOUTH AT BEDTIME FOR CHOLESTEROL     RESTASIS OP  Apply to eye daily.     triamcinolone cream 0.1 %  Commonly known as:  KENALOG  Apply 1 application topically 2 (two) times daily. prn     valACYclovir 500 MG tablet  Commonly known as:  VALTREX  Take 500 mg by mouth 3  (three) times daily as needed.     VITAMIN D PO  Take 5,000 Units by mouth daily.     zolpidem 5 MG tablet  Commonly known as:  AMBIEN  Take 1 tablet (5 mg total) by mouth at bedtime as needed for sleep.     No Known Allergies  Past Medical History  Diagnosis Date  . Hypertension   . Hyperlipidemia   . GERD (gastroesophageal reflux disease)   . Vitamin D deficiency    Review of Systems Non contributory to above  Objective:   Physical Exam  BP 92/60  Pulse 72  Temp(Src) 97.3 F (36.3 C) (Temporal)  Resp 16  Ht 5\' 7"  (1.702 m)  Wt 153 lb 9.6 oz (69.673 kg)  BMI 24.05 kg/m2  HEENT - Eac's patent. TM's Nl. EOM's full. PERRLA. NasoOroPharynx clear. Neck - supple. Nl Thyroid. Carotids 2+ & No bruits, nodes, JVD Chest - Clear equal BS w/o Rales, rhonchi, wheezes. Cor - Nl HS. RRR w/o sig MGR. PP 1(+). No edema. Abd - No palpable organomegaly, masses or tenderness. BS nl. MS- FROM w/o deformities. Muscle power, tone and bulk  Nl. Gait Nl. Neuro - No obvious Cr N abnormalities. Sensory, motor and Cerebellar functions appear Nl w/o focal abnormalities.  Assessment & Plan:   1. Gastroenteritis presumed infectious  - Rx Flagyl, Cipro & Hyoscyamine  2. Left sided sciatica  - Ambulatory referral to Orthopedic Surgery  3. Medication management

## 2014-09-13 ENCOUNTER — Ambulatory Visit (INDEPENDENT_AMBULATORY_CARE_PROVIDER_SITE_OTHER): Payer: BC Managed Care – PPO | Admitting: Internal Medicine

## 2014-09-13 ENCOUNTER — Encounter: Payer: Self-pay | Admitting: Internal Medicine

## 2014-09-13 VITALS — BP 90/56 | HR 76 | Temp 97.7°F | Resp 16 | Ht 67.75 in | Wt 153.6 lb

## 2014-09-13 DIAGNOSIS — Z111 Encounter for screening for respiratory tuberculosis: Secondary | ICD-10-CM

## 2014-09-13 DIAGNOSIS — R7309 Other abnormal glucose: Secondary | ICD-10-CM

## 2014-09-13 DIAGNOSIS — E88819 Insulin resistance, unspecified: Secondary | ICD-10-CM

## 2014-09-13 DIAGNOSIS — E8881 Metabolic syndrome: Secondary | ICD-10-CM

## 2014-09-13 DIAGNOSIS — E559 Vitamin D deficiency, unspecified: Secondary | ICD-10-CM

## 2014-09-13 DIAGNOSIS — Z1212 Encounter for screening for malignant neoplasm of rectum: Secondary | ICD-10-CM

## 2014-09-13 DIAGNOSIS — I10 Essential (primary) hypertension: Secondary | ICD-10-CM

## 2014-09-13 DIAGNOSIS — K219 Gastro-esophageal reflux disease without esophagitis: Secondary | ICD-10-CM

## 2014-09-13 DIAGNOSIS — E785 Hyperlipidemia, unspecified: Secondary | ICD-10-CM

## 2014-09-13 DIAGNOSIS — Z79899 Other long term (current) drug therapy: Secondary | ICD-10-CM

## 2014-09-13 DIAGNOSIS — Z125 Encounter for screening for malignant neoplasm of prostate: Secondary | ICD-10-CM

## 2014-09-13 DIAGNOSIS — Z0001 Encounter for general adult medical examination with abnormal findings: Secondary | ICD-10-CM

## 2014-09-13 DIAGNOSIS — R6889 Other general symptoms and signs: Secondary | ICD-10-CM

## 2014-09-13 NOTE — Patient Instructions (Signed)
Recommend the book "The END of DIETING" by Dr Baker Janus   and the book "The END of DIABETES " by Dr Excell Seltzer  At [email or web address] - get book & Audio CD's      Being diabetic has a  300% increased risk for heart attack, stroke, cancer, and alzheimer- type vascular dementia. It is very important that you work harder with diet by avoiding all foods that are white except chicken & fish. Avoid white rice (brown & wild rice is OK), white potatoes (sweetpotatoes in moderation is OK), White bread or wheat bread or anything made out of white flour like bagels, donuts, rolls, buns, biscuits, cakes, pastries, cookies, pizza crust, and pasta (made from white flour & egg whites) - vegetarian pasta or spinach or wheat pasta is OK. Multigrain breads like Arnold's or [employer], or multigrain sandwich thins or flatbreads.  Diet, exercise and weight loss can reverse and cure diabetes in the early stages.  Diet, exercise and weight loss is very important in the control and prevention of complications of diabetes which affects every system in your body, ie. Brain - dementia/stroke, eyes - glaucoma/blindness, heart - heart attack/heart failure, kidneys - dialysis, stomach - gastric paralysis, intestines - malabsorption, nerves - severe painful neuritis, circulation - gangrene & loss of a leg(s), and finally cancer and Alzheimers.    I recommend avoid fried & greasy foods,  sweets/candy, white rice (brown or wild rice or Quinoa is OK), white potatoes (sweet potatoes are OK) - anything made from white flour - bagels, doughnuts, rolls, buns, biscuits,white and wheat breads, pizza crust and traditional pasta made of white flour & egg white(vegetarian pasta or spinach or wheat pasta is OK).  Multi-grain bread is OK - like multi-grain flat bread or sandwich thins. Avoid alcohol in excess. Exercise is also important.    Eat all the vegetables you want - avoid meat, especially red meat and dairy - especially cheese.  Cheese  is the most concentrated form of trans-fats which is the worst thing to clog up our arteries. Veggie cheese is OK which can be found in the fresh produce section at [employer] or [employer] or [employer]  Preventive Care for Adults A healthy lifestyle and preventive care can promote health and wellness. Preventive health guidelines for men include the following key practices:  A routine yearly physical is a good way to check with your health care provider about your health and preventative screening. It is a chance to share any concerns and updates on your health and to receive a thorough exam.  Visit your dentist for a routine exam and preventative care every 6 months. Brush your teeth twice a day and floss once a day. Good oral hygiene prevents tooth decay and gum disease.  The frequency of eye exams is based on your age, health, family medical history, use of contact lenses, and other factors. Follow your health care provider's recommendations for frequency of eye exams.  Eat a healthy diet. Foods such as vegetables, fruits, whole grains, low-fat dairy products, and lean protein foods contain the nutrients you need without too many calories. Decrease your intake of foods high in solid fats, added sugars, and salt. Eat the right amount of calories for you.Get information about a proper diet from your health care provider, if necessary.  Regular physical exercise is one of the most important things you can do for your health. Most adults should get at least 150 minutes of moderate-intensity exercise (any activity that  increases your heart rate and causes you to sweat) each week. In addition, most adults need muscle-strengthening exercises on 2 or more days a week.  Maintain a healthy weight. The body mass index (BMI) is a screening tool to identify possible weight problems. It provides an estimate of body fat based on height and weight. Your health care provider can find your BMI and can help you  achieve or maintain a healthy weight.For adults 20 years and older:  A BMI below 18.5 is considered underweight.  A BMI of 18.5 to 24.9 is normal.  A BMI of 25 to 29.9 is considered overweight.  A BMI of 30 and above is considered obese.  Maintain normal blood lipids and cholesterol levels by exercising and minimizing your intake of saturated fat. Eat a balanced diet with plenty of fruit and vegetables. Blood tests for lipids and cholesterol should begin at age 20 and be repeated every 5 years. If your lipid or cholesterol levels are high, you are over 50, or you are at high risk for heart disease, you may need your cholesterol levels checked more frequently.Ongoing high lipid and cholesterol levels should be treated with medicines if diet and exercise are not working.  If you smoke, find out from your health care provider how to quit. If you do not use tobacco, do not start.  Lung cancer screening is recommended for adults aged 72-80 years who are at high risk for developing lung cancer because of a history of smoking. A yearly low-dose CT scan of the lungs is recommended for people who have at least a 30-pack-year history of smoking and are a current smoker or have quit within the past 15 years. A pack year of smoking is smoking an average of 1 pack of cigarettes a day for 1 year (for example: 1 pack a day for 30 years or 2 packs a day for 15 years). Yearly screening should continue until the smoker has stopped smoking for at least 15 years. Yearly screening should be stopped for people who develop a health problem that would prevent them from having lung cancer treatment.  If you choose to drink alcohol, do not have more than 2 drinks per day. One drink is considered to be 12 ounces (355 mL) of beer, 5 ounces (148 mL) of wine, or 1.5 ounces (44 mL) of liquor.  Avoid use of street drugs. Do not share needles with anyone. Ask for help if you need support or instructions about stopping the use of  drugs.  High blood pressure causes heart disease and increases the risk of stroke. Your blood pressure should be checked at least every 1-2 years. Ongoing high blood pressure should be treated with medicines, if weight loss and exercise are not effective.  If you are 28-64 years old, ask your health care provider if you should take aspirin to prevent heart disease.  Diabetes screening involves taking a blood sample to check your fasting blood sugar level. This should be done once every 3 years, after age 13, if you are within normal weight and without risk factors for diabetes. Testing should be considered at a younger age or be carried out more frequently if you are overweight and have at least 1 risk factor for diabetes.  Colorectal cancer can be detected and often prevented. Most routine colorectal cancer screening begins at the age of 78 and continues through age 56. However, your health care provider may recommend screening at an earlier age if you have risk  factors for colon cancer. On a yearly basis, your health care provider may provide home test kits to check for hidden blood in the stool. Use of a small camera at the end of a tube to directly examine the colon (sigmoidoscopy or colonoscopy) can detect the earliest forms of colorectal cancer. Talk to your health care provider about this at age 57, when routine screening begins. Direct exam of the colon should be repeated every 5-10 years through age 74, unless early forms of precancerous polyps or small growths are found.  PHepatitis C blood testing is recommended for all people born from [birth year] through 1965 and any individual with known risks for hepatitis C.  Practice safe sex. Use condoms and avoid high-risk sexual practices to reduce the spread of sexually transmitted infections (STIs). STIs include gonorrhea, chlamydia, syphilis, trichomonas, herpes, HPV, and human immunodeficiency virus (HIV). Herpes, HIV, and HPV are viral illnesses that  have no cure. They can result in disability, cancer, and death.  A  screening for abdominal aortic aneurysm (AAA) and surgical repair of large AAAs by ultrasound are recommended for men with history of elevated  BP or  who are current or former smokers.  Talk with your health care provider about prostate cancer screening.  Testicular cancer screening includes self-exam, a health care provider exam, and other screening tests. Consult with your health care provider about any symptoms you have or any concerns you have about testicular cancer.  Use sunscreen. Apply sunscreen liberally and repeatedly throughout the day. You should seek shade when your shadow is shorter than you. Protect yourself by wearing long sleeves, pants, a wide-brimmed hat, and sunglasses year round, whenever you are outdoors.  Once a month, do a whole-body skin exam, using a mirror to look at the skin on your back. Tell your health care provider about new moles, moles that have irregular borders, moles that are larger than a pencil eraser, or moles that have changed in shape or color.  Stay current with required vaccines (immunizations).  Influenza vaccine. All adults should be immunized every year.  Tetanus, diphtheria, and acellular pertussis (Td, Tdap) vaccine. An adult who has not previously received Tdap or who does not know his vaccine status should receive 1 dose of Tdap. This initial dose should be followed by tetanus and diphtheria toxoids (Td) booster doses every 10 years. Adults with an unknown or incomplete history of completing a 3-dose immunization series with Td-containing vaccines should begin or complete a primary immunization series including a Tdap dose. Adults should receive a Td booster every 10 years.    Zoster vaccine. One dose is recommended for adults aged 29 years or older unless certain conditions are present.    Pneumococcal 13-valent conjugate (PCV13) vaccine. When indicated, a person who is  uncertain of his immunization history and has no record of immunization should receive the PCV13 vaccine. An adult aged 83 years or older who has certain medical conditions and has not been previously immunized should receive 1 dose of PCV13 vaccine. This PCV13 should be followed with a dose of pneumococcal polysaccharide (PPSV23) vaccine.     The PPSV23 vaccine dose should be obtained at least 8 weeks after the dose of PCV13 vaccine. An adult aged 74 years or older who has certain medical conditions and previously received 1 or more doses of PPSV23 vaccine should receive 1 dose of PCV13. The PCV13 vaccine dose should be obtained 1 or more years after the last PPSV23 vaccine dose.  Pneumococcal polysaccharide (  PPSV23) vaccine. When PCV13 is also indicated, PCV13 should be obtained first. All adults aged 22 years and older should be immunized. An adult younger than age 65 years who has certain medical conditions should be immunized. Any person who resides in a nursing home or long-term care facility should be immunized. An adult smoker should be immunized. People with an immunocompromised condition and certain other conditions should receive both PCV13 and PPSV23 vaccines. People with human immunodeficiency virus (HIV) infection should be immunized as soon as possible after diagnosis. Immunization during chemotherapy or radiation therapy should be avoided. Routine use of PPSV23 vaccine is not recommended for American Indians, [place] Natives, or people younger than 65 years unless there are medical conditions that require PPSV23 vaccine. When indicated, people who have unknown immunization and have no record of immunization should receive PPSV23 vaccine. One-time revaccination 5 years after the first dose of PPSV23 is recommended for people aged 19-64 years who have chronic kidney failure, nephrotic syndrome, asplenia, or immunocompromised conditions. People who received 1-2 doses of PPSV23 before age 66 years  should receive another dose of PPSV23 vaccine at age 63 years or later if at least 5 years have passed since the previous dose. Doses of PPSV23 are not needed for people immunized with PPSV23 at or after age 59 years.    Hepatitis A vaccine. Adults who wish to be protected from this disease, have certain high-risk conditions, work with hepatitis A-infected animals, work in hepatitis A research labs, or travel to or work in countries with a high rate of hepatitis A should be immunized. Adults who were previously unvaccinated and who anticipate close contact with an international adoptee during the first 60 days after arrival in the [place] from a country with a high rate of hepatitis A should be immunized.  Hepatitis B vaccine. Adults should be immunized if they wish to be protected from this disease, have certain high-risk conditions, may be exposed to blood or other infectious body fluids,   are household contacts or sex partners of hepatitis B positive people, are clients or workers in certain care facilities, or travel to or work in countries with a high rate of hepatitis B.                               Preventive Service / Frequency   Blood pressure check.\  Lipid and cholesterol check.\  Lung cancer screening. / Every year if you are aged 84-80 years and have a 30-pack-year history of smoking and currently smoke or have quit within the past 15 years. Yearly screening is stopped once you have quit smoking for at least 15 years or develop a health problem that would prevent you from having lung cancer treatment.  Fecal occult blood test (FOBT) of stool. / Every year beginning at age 49 and continuing until age 61. You may not have to do this test if you get a colonoscopy every 10 years.  Flexible sigmoidoscopy** or colonoscopy.** / Every 5 years for a flexible sigmoidoscopy or every 10 years for a colonoscopy beginning at age 63 and continuing until age 57.  Hepatitis C blood test.**  / For all people born from [birth year] through 1965 and any individual with known risks for hepatitis C.  Skin self-exam. / Monthly.  Influenza vaccine. / Every year.  Tetanus, diphtheria, and acellular pertussis (Tdap/Td) vaccine.** / Consult your health care provider. 1 dose of Td every 10 years.  Varicella vaccine.** / Consult your health care provider.  Zoster vaccine.** / 1 dose for adults aged 51 years or older.  Measles, mumps, rubella (MMR) vaccine.** / You need at least 1 dose of MMR if you were born in 1957 or later. You may also need a second dose.  Pneumococcal 13-valent conjugate (PCV13) vaccine.** / Consult your health care provider.  Pneumococcal polysaccharide (PPSV23) vaccine.** / 1 to 2 doses if you smoke cigarettes or if you have certain conditions.  Meningococcal vaccine.** / Consult your health care provider.  Hepatitis A vaccine.** / Consult your health care provider.  Hepatitis B vaccine.** / Consult your health care provider.  Haemophilus influenzae type b (Hib) vaccine.** / Consult your health care provider.

## 2014-09-13 NOTE — Progress Notes (Signed)
Patient ID: [name] [name], male   DOB: [birth year], 63 y.o.   MRN: [id number]  Annual Screening Comprehensive Examination  This very nice 63 y.o.male presents for complete physical.  Patient has been followed for HTN, T2_NIDDM  Prediabetes, Hyperlipidemia, and Vitamin D Deficiency. Patient was seen yesterday for an intercurrent Gastroentgeritisof 3-4 days duration & started on Cipro/Flagyl.   Patient has hx/o HTN  since 1988. Patient's BP has been controlled at home.Today's BP was  90/56 - rechecked at 114/60. Patient denies any cardiac symptoms as chest pain, palpitations, shortness of breath, dizziness or ankle swelling.   Patient's hyperlipidemia is controlled with diet and medications. Patient denies myalgias or other medication SE's. Last lipids were 04/01/2014: Cholesterol, Total 190; HDL Cholesterol by NMR 49; LDL (calc) 88; Triglycerides 267*   Patient has is monitored for prediabetes and patient denies reactive hypoglycemic symptoms, visual blurring, diabetic polys or paresthesias. Last A1c was 5.4% on 04/01/2014.   Finally, patient has history of Vitamin D Deficiency of 28 in 2008  and last vitamin D was  79 on 04/01/2014.  Medication Sig  . atenolol  100 MG tablet Take 1 tablet (100 mg total) by mouth daily.  Marland Kitchen DIPROLENE-AF) 0.05 % crm PRN  . BABY ASPIRIN PO Take 81 mg by mouth daily.  . celecoxib 200 MG capsule Take 1 capsule (200 mg total) by mouth daily.  Marland Kitchen VITAMIN D  Take 5,000 Units by mouth daily.   . Ciprofloxacin 500 MG tablet Take 1 tablet (500 mg total) by mouth 2 (two) times daily.  . RESTASIS  Apply to eye daily.  Marland Kitchen esomeprazole  40 MG capsule Take 1 capsule (40 mg total) by mouth daily at 12 noon.  . finasteride  5 MG tablet Take 1 tablet (5 mg total) by mouth daily.  .  NORCO 5-325 MG per tablet Take 1/2 to 1 or 2 tabs every 4 hours if needed for pain  . hyoscyamine 0.125 MG tablet Take 1-2 tablets 4 x day or every 4 hours if needed   . ketoconazole 2 % cream daily as  needed. Apply sparingly to rash 2-3 x daily  . lisinopril-hctz 20-25 MG per tablet Take 1 tablet by mouth daily.  . metroNIDAZOLE  500 MG tablet Take 1 tablet (500 mg total) by mouth 3 (three) times daily.  Marland Kitchen FISH OIL Take 1,200 mg by mouth daily.   . pravastatin  40 MG tablet TAKE 1 BY MOUTH AT BEDTIME FOR CHOLESTEROL  . triamcinolone cream  0.1 % Apply 1 application topically 2 (two) times daily. prn  . valACYclovir  500 MG tablet Take 500 mg by mouth 3 (three) times daily as needed.  . zolpidem  5 MG tablet Take 1 tablet (5 mg total) by mouth at bedtime as needed for sleep.   No current facility-administered medications on file prior to visit.   No Known Allergies  Past Medical History  Diagnosis Date  . Hypertension   . Hyperlipidemia   . GERD (gastroesophageal reflux disease)   . Vitamin D deficiency    Past Surgical History  Procedure Laterality Date  . Esophagogastroduodenoscopy  1990   Family History  Problem Relation Age of Onset  . Osteoporosis Mother   . Schizophrenia Mother   . Heart disease Father   . COPD Father   . Diabetes Brother     History   Social History  . Marital Status: Divorced    Spouse Name: N/A    Number of Children:  N/A  . Years of Education: N/A   Occupational History  . Not on file.   Social History Main Topics  . Smoking status: Never Smoker   . Smokeless tobacco: Not on file  . Alcohol Use: Yes     Comment: social  . Drug Use: Not on file  . Sexual Activity: Not on file   Other Topics Concern  . Not on file   Social History Narrative  . No narrative on file    ROS Constitutional: Denies fever, chills, weight loss/gain, headaches, insomnia, fatigue, night sweats or change in appetite. Eyes: Denies redness, blurred vision, diplopia, discharge, itchy or watery eyes.  ENT: Denies discharge, congestion, post nasal drip, epistaxis, sore throat, earache, hearing loss, dental pain, Tinnitus, Vertigo, Sinus pain or snoring.   Cardio: Denies chest pain, palpitations, irregular heartbeat, syncope, dyspnea, diaphoresis, orthopnea, PND, claudication or edema Respiratory: denies cough, dyspnea, DOE, pleurisy, hoarseness, laryngitis or wheezing.  Gastrointestinal: Denies dysphagia, heartburn, reflux, water brash, pain, cramps, nausea, vomiting, bloating, diarrhea, constipation, hematemesis, melena, hematochezia, jaundice or hemorrhoids Genitourinary: Denies dysuria, frequency, urgency, nocturia, hesitancy, discharge, hematuria or flank pain Musculoskeletal: Denies arthralgia, myalgia, stiffness, Jt. Swelling, pain, limp or strain/sprain. Denies Falls. Skin: Denies puritis, rash, hives, warts, acne, eczema or change in skin lesion Neuro: No weakness, tremor, incoordination, spasms, paresthesia or pain Psychiatric: Denies confusion, memory loss or sensory loss. Denies Depression. Endocrine: Denies change in weight, skin, hair change, nocturia, and paresthesia, diabetic polys, visual blurring or hyper / hypo glycemic episodes.  Heme/Lymph: No excessive bleeding, bruising or enlarged lymph nodes.  Physical Exam  BP 90/56  Pulse 76  Temp(Src) 97.7 F (36.5 C) (Temporal)  Resp 16  Ht 5' 7.75" (1.721 m)  Wt 153 lb 9.6 oz (69.673 kg)  BMI 23.52 kg/m2  General Appearance: Well nourished, in no apparent distress. Eyes: PERRLA, EOMs, conjunctiva no swelling or erythema, normal fundi and vessels. Sinuses: No frontal/maxillary tenderness ENT/Mouth: EACs patent / TMs  nl. Nares clear without erythema, swelling, mucoid exudates. Oral hygiene is good. No erythema, swelling, or exudate. Tongue normal, non-obstructing. Tonsils not swollen or erythematous. Hearing normal.  Neck: Supple, thyroid normal. No bruits, nodes or JVD. Respiratory: Respiratory effort normal.  BS equal and clear bilateral without rales, rhonci, wheezing or stridor. Cardio: Heart sounds are normal with regular rate and rhythm and no murmurs, rubs or gallops.  Peripheral pulses are normal and equal bilaterally without edema. No aortic or femoral bruits. Chest: symmetric with normal excursions and percussion.  Abdomen: Flat, soft, with bowl sounds. Nontender, no guarding, rebound, hernias, masses, or organomegaly.  Lymphatics: Non tender without lymphadenopathy.  Genitourinary: Deferred due to diarrhea. Musculoskeletal: Full ROM all peripheral extremities, joint stability, 5/5 strength, and normal gait. Skin: Warm and dry without rashes, lesions, cyanosis, clubbing or  ecchymosis.  Neuro: Cranial nerves intact, reflexes equal bilaterally. Normal muscle tone, no cerebellar symptoms. Sensation intact.  Pysch: Awake and oriented X 3with normal affect, insight and judgment appropriate.   Assessment and Plan  1. Annual Screening Examination 2. Hypertension  3. Hyperlipidemia 4. Pre Diabetes, screening 5. Vitamin D Deficiency   Continue prudent diet as discussed, weight control, BP monitoring, regular exercise, and medications as discussed.  Discussed med effects and SE's. Routine screening labs and tests as requested with regular follow-up as recommended.

## 2014-09-14 LAB — CBC WITH DIFFERENTIAL/PLATELET
Basophils Absolute: 0.1 10*3/uL (ref 0.0–0.1)
Basophils Relative: 1 % (ref 0–1)
Eosinophils Absolute: 0.1 10*3/uL (ref 0.0–0.7)
Eosinophils Relative: 1 % (ref 0–5)
HCT: 42.6 % (ref 39.0–52.0)
Hemoglobin: 15 g/dL (ref 13.0–17.0)
Lymphocytes Relative: 11 % — ABNORMAL LOW (ref 12–46)
Lymphs Abs: 0.9 10*3/uL (ref 0.7–4.0)
MCH: 33.2 pg (ref 26.0–34.0)
MCHC: 35.2 g/dL (ref 30.0–36.0)
MCV: 94.2 fL (ref 78.0–100.0)
Monocytes Absolute: 1.3 10*3/uL — ABNORMAL HIGH (ref 0.1–1.0)
Monocytes Relative: 16 % — ABNORMAL HIGH (ref 3–12)
Neutro Abs: 6 10*3/uL (ref 1.7–7.7)
Neutrophils Relative %: 71 % (ref 43–77)
Platelets: 264 10*3/uL (ref 150–400)
RBC: 4.52 MIL/uL (ref 4.22–5.81)
RDW: 13 % (ref 11.5–15.5)
WBC: 8.4 10*3/uL (ref 4.0–10.5)

## 2014-09-14 LAB — LIPID PANEL
Cholesterol: 138 mg/dL (ref 0–200)
HDL: 39 mg/dL — ABNORMAL LOW (ref >39)
LDL Cholesterol: 72 mg/dL (ref 0–99)
Total CHOL/HDL Ratio: 3.5 Ratio
Triglycerides: 135 mg/dL (ref <150)
VLDL: 27 mg/dL (ref 0–40)

## 2014-09-14 LAB — TSH: TSH: 1.409 u[IU]/mL (ref 0.350–4.500)

## 2014-09-14 LAB — BASIC METABOLIC PANEL WITH GFR
BUN: 23 mg/dL (ref 6–23)
CO2: 27 mEq/L (ref 19–32)
Calcium: 9.1 mg/dL (ref 8.4–10.5)
Chloride: 92 mEq/L — ABNORMAL LOW (ref 96–112)
Creat: 2.08 mg/dL — ABNORMAL HIGH (ref 0.50–1.35)
GFR, Est African American: 38 mL/min — ABNORMAL LOW
GFR, Est Non African American: 33 mL/min — ABNORMAL LOW
Glucose, Bld: 83 mg/dL (ref 70–99)
Potassium: 4.2 mEq/L (ref 3.5–5.3)
Sodium: 131 mEq/L — ABNORMAL LOW (ref 135–145)

## 2014-09-14 LAB — MAGNESIUM: Magnesium: 1.9 mg/dL (ref 1.5–2.5)

## 2014-09-14 LAB — VITAMIN B12: Vitamin B-12: 1187 pg/mL — ABNORMAL HIGH (ref 211–911)

## 2014-09-14 LAB — MICROALBUMIN / CREATININE URINE RATIO
Creatinine, Urine: 158.2 mg/dL
Microalb Creat Ratio: 63.2 mg/g — ABNORMAL HIGH (ref 0.0–30.0)
Microalb, Ur: 10 mg/dL — ABNORMAL HIGH (ref <2.0)

## 2014-09-14 LAB — IRON AND TIBC
%SAT: 18 % — ABNORMAL LOW (ref 20–55)
Iron: 41 ug/dL — ABNORMAL LOW (ref 42–165)
TIBC: 230 ug/dL (ref 215–435)
UIBC: 189 ug/dL (ref 125–400)

## 2014-09-14 LAB — HEPATIC FUNCTION PANEL
ALT: 32 U/L (ref 0–53)
AST: 36 U/L (ref 0–37)
Albumin: 3.4 g/dL — ABNORMAL LOW (ref 3.5–5.2)
Alkaline Phosphatase: 55 U/L (ref 39–117)
Bilirubin, Direct: 0.1 mg/dL (ref 0.0–0.3)
Indirect Bilirubin: 0.4 mg/dL (ref 0.2–1.2)
Total Bilirubin: 0.5 mg/dL (ref 0.2–1.2)
Total Protein: 6 g/dL (ref 6.0–8.3)

## 2014-09-14 LAB — URINALYSIS, MICROSCOPIC ONLY
Bacteria, UA: NONE SEEN
Casts: NONE SEEN
Crystals: NONE SEEN
Squamous Epithelial / LPF: NONE SEEN

## 2014-09-14 LAB — TESTOSTERONE: Testosterone: 307 ng/dL (ref 300–890)

## 2014-09-14 LAB — PSA: PSA: 1.16 ng/mL (ref <=4.00)

## 2014-09-14 LAB — INSULIN, FASTING: Insulin fasting, serum: 14.4 u[IU]/mL (ref 2.0–19.6)

## 2014-09-14 LAB — HEMOGLOBIN A1C
Hgb A1c MFr Bld: 5.3 % (ref <5.7)
Mean Plasma Glucose: 105 mg/dL (ref <117)

## 2014-09-14 LAB — VITAMIN D 25 HYDROXY (VIT D DEFICIENCY, FRACTURES): Vit D, 25-Hydroxy: 92 ng/mL — ABNORMAL HIGH (ref 30–89)

## 2014-09-16 LAB — TB SKIN TEST
Induration: 0 mm
TB Skin Test: NEGATIVE

## 2014-09-23 ENCOUNTER — Other Ambulatory Visit: Payer: Self-pay

## 2014-09-27 ENCOUNTER — Other Ambulatory Visit: Payer: Self-pay | Admitting: Internal Medicine

## 2014-09-27 ENCOUNTER — Telehealth: Payer: Self-pay | Admitting: *Deleted

## 2014-09-27 MED ORDER — HYDROCODONE-ACETAMINOPHEN 5-325 MG PO TABS: ORAL_TABLET | ORAL | Status: DC

## 2014-09-27 NOTE — Telephone Encounter (Signed)
Patient advised to pick up Hydrocodone RX.

## 2014-09-28 ENCOUNTER — Other Ambulatory Visit: Payer: Self-pay | Admitting: Internal Medicine

## 2014-10-10 ENCOUNTER — Ambulatory Visit: Payer: BC Managed Care – PPO | Admitting: Family Medicine

## 2014-10-11 ENCOUNTER — Encounter: Payer: Self-pay | Admitting: Family Medicine

## 2014-10-11 ENCOUNTER — Ambulatory Visit (INDEPENDENT_AMBULATORY_CARE_PROVIDER_SITE_OTHER): Payer: BC Managed Care – PPO | Admitting: Family Medicine

## 2014-10-11 VITALS — BP 120/72 | HR 53 | Ht 68.0 in | Wt 152.0 lb

## 2014-10-11 DIAGNOSIS — M25552 Pain in left hip: Secondary | ICD-10-CM

## 2014-10-11 NOTE — Patient Instructions (Addendum)
Nice to meet you Exercises 3 times a week.  Ice 20 minutes 2 times daily. Usually after activity and before bed. Turmeric 500mg  twice daily.  Tylenol 650 mg three times a day Ibuprofen 800mg  3 times a day for 3 days.  Fish oil 2 grams daily.  Get the MRI, I would look into the hip You know where I am if you need me. I am willing to help in anyway

## 2014-10-11 NOTE — Assessment & Plan Note (Signed)
I do believe the patient's symptomatology is an intra-articular hip followed G at this time. We discussed different treatment options as well as the different pathology that could be contributing. Patient has had pain for approximately 2 months now and if anything is worsening and patient is now ambulating with the aid of crutches. I do feel that further imaging is necessary. I would elect to have an MRI of the left hip as well as femur to rule out any occult fracture. Patient also had a flare of diverticulitis it appears and I do think that abscess formation could be possible especially of the hip flexor. Patient is also had a 10 pound weight loss. Patient does not have any fever or systemic findings other than the weight loss at this time. Patient states that he does have a good relationship with the orthopedic surgeon and will call their office to have the MRI scheduled. If he has any difficulty he will call me. We discussed over-the-counter medications or could be beneficial and help as well. Differential also includes an avascular necrosis and we discussed the possibility of repeat x-rays but MRI would show any other advance findings. Discussed icing protocol. Patient then can follow-up with me on an as-needed basis.

## 2014-10-11 NOTE — Progress Notes (Signed)
Tawana ScaleZach Smith D.O. [facility] [address] [place], [place] [address] Phone: [phone] Subjective:    I'm seeing this patient by the request  of: MCKEOWN,WILLIAM DAVID, MD    CC: left sided leg and back pain.   IHK:[id number]:Subjective [name] GuyRandell Z [name] is a 63 y.o. male coming in with complaint of left-sided back and hip pain.patient was seen previously by primary care provider back in the early part of September. Patient was diagnosed with a left low back pain with sciatica. Patient was given a prednisone taper without any significant improvement. Patient attempted to have another prednisone taper without any significant improvement as well. Patient did have to go out of the [place] on vacationand when he returned he did have a bout of diverticulitis. Patient states during this time he not did not make any significant improvement in the pain and if anything it seems to be getting worse. Patient states that the pain is localized mostly over the left side of the back and radiated down his leg. Patient states that there is an associated groin pain with it. Patient was seen by an orthopedic surgeon who did get an MRI of his low back pain. Lumbar MRI was reviewed by me and does show an L3 nerve root impingement. Patient did have an epidural steroid injection of the L3 nerve root with no significant improvement. Patient continued to have pain that is now radiating down his leg and describes it as a dull nagging pain that has made it difficult to even weight bear. Patient was seen yesterday by the orthopedic surgeon and was placed on crutches. Patient is also taking hydrocodone fairly regularly. The pain does wake him up at night. Has noticed weakness and has had a 10 pound weight loss.patient rates the pain as 10 out of 10 and seems to be worsening daily.  Patient did have x-rays of his hips bilaterally that showed mild symmetric degenerative changes but no severe breakdown. These were greater than  2 months ago.  Patient was also given an intra-articular injection in the hip that did give him minutes of release and unfortunately seemed to come back.     Past medical history, social, surgical and family history all reviewed in electronic medical record.   Review of Systems: No headache, visual changes, nausea, vomiting, diarrhea, constipation, dizziness, abdominal pain, skin rash, fevers, chills, night sweats, weight loss, swollen lymph nodes, body aches, joint swelling, muscle aches, chest pain, shortness of breath, mood changes.   Objective Blood pressure 120/72, pulse 53, height 5\' 8"  (1.727 m), weight 152 lb (68.947 kg), SpO2 97 %.  General: No apparent distress alert and oriented x3 mood and affect normal, dressed appropriately.  HEENT: Pupils equal, extraocular movements intact  Respiratory: Patient's speak in full sentences and does not appear short of breath  Cardiovascular: No lower extremity edema, non tender, no erythema  Skin: Warm dry intact with no signs of infection or rash on extremities or on axial skeleton.  Abdomen: Soft nontender  Neuro: Cranial nerves II through XII are intact, neurovascularly intact in all extremities with 2+ DTRs and 2+ pulses.  Lymph: No lymphadenopathy of posterior or anterior cervical chain or axillae bilaterally.  Gait significant antalgic gait with patient not willing to weight-bear on left leg MSK:  Non tender with full range of motion and good stability and symmetric strength and tone of shoulders, elbows, wrist, hip, knee and ankles bilaterally.  Back Exam:  Inspection: Unremarkable  Motion: Flexion 45 deg, Extension  45 deg, Side Bending to 45 deg bilaterally,  Rotation to 45 deg bilaterally  SLR laying: Negative  XSLR laying: Negative  Palpable tenderness: minimal over the paraspinal musculature of the lumbar spine FABER: negative. Severe pain with FADIR Sensory change: Gross sensation intact to all lumbar and sacral dermatomes.    Reflexes: 2+ at both patellar tendons, 2+ at achilles tendons, Babinski's downgoing.  Strength at foot  Plantar-flexion: 5/5 Dorsi-flexion: 5/5 Eversion: 5/5 Inversion: 5/5  Leg strength  Patient does have significant weakness of the left hip flexor compared to the contralateral side with 3 out of 5 strength. Looking at patient's legs patient has lost 2 cm in diameter of the quadriceps and hamstring compared to the contralateral leg. Patient is also having weakness of the calf itself. Range of motion the hip on the left hip shows patient has internal range of motion of 0 compared to 20 on the contralateral side. Internal right rotation gives him severe pain.   Impression and Recommendations:     This case required medical decision making of moderate complexity.

## 2014-10-13 ENCOUNTER — Other Ambulatory Visit: Payer: Self-pay | Admitting: Orthopaedic Surgery

## 2014-10-13 DIAGNOSIS — M25552 Pain in left hip: Secondary | ICD-10-CM

## 2014-10-17 ENCOUNTER — Ambulatory Visit
Admission: RE | Admit: 2014-10-17 | Discharge: 2014-10-17 | Disposition: A | Discharge status: Home / Self Care | Payer: BC Managed Care – PPO | Source: Ambulatory Visit | Attending: Orthopaedic Surgery | Admitting: Orthopaedic Surgery

## 2014-10-17 DIAGNOSIS — M25552 Pain in left hip: Secondary | ICD-10-CM

## 2014-10-19 ENCOUNTER — Other Ambulatory Visit (HOSPITAL_COMMUNITY): Payer: Self-pay | Admitting: Orthopaedic Surgery

## 2014-10-21 ENCOUNTER — Encounter (HOSPITAL_COMMUNITY): Payer: Self-pay

## 2014-10-21 ENCOUNTER — Encounter (HOSPITAL_COMMUNITY)
Admission: RE | Admit: 2014-10-21 | Discharge: 2014-10-21 | Disposition: A | Discharge status: Home / Self Care | Payer: BC Managed Care – PPO | Source: Ambulatory Visit | Attending: Orthopaedic Surgery | Admitting: Orthopaedic Surgery

## 2014-10-21 DIAGNOSIS — Z01818 Encounter for other preprocedural examination: Secondary | ICD-10-CM | POA: Diagnosis present

## 2014-10-21 DIAGNOSIS — M87052 Idiopathic aseptic necrosis of left femur: Secondary | ICD-10-CM

## 2014-10-21 HISTORY — DX: Unspecified osteoarthritis, unspecified site: M19.90

## 2014-10-21 HISTORY — DX: Dry eye syndrome of bilateral lacrimal glands: H04.123

## 2014-10-21 LAB — SURGICAL PCR SCREEN
MRSA, PCR: NEGATIVE
Staphylococcus aureus: NEGATIVE

## 2014-10-21 LAB — COMPREHENSIVE METABOLIC PANEL
ALT: 27 U/L (ref 0–53)
AST: 26 U/L (ref 0–37)
Albumin: 3.8 g/dL (ref 3.5–5.2)
Alkaline Phosphatase: 60 U/L (ref 39–117)
Anion gap: 14 (ref 5–15)
BUN: 27 mg/dL — ABNORMAL HIGH (ref 6–23)
CO2: 28 mEq/L (ref 19–32)
Calcium: 9.7 mg/dL (ref 8.4–10.5)
Chloride: 97 mEq/L (ref 96–112)
Creatinine, Ser: 1.32 mg/dL (ref 0.50–1.35)
GFR calc Af Amer: 65 mL/min — ABNORMAL LOW (ref >90)
GFR calc non Af Amer: 56 mL/min — ABNORMAL LOW (ref >90)
Glucose, Bld: 93 mg/dL (ref 70–99)
Potassium: 4.2 mEq/L (ref 3.7–5.3)
Sodium: 139 mEq/L (ref 137–147)
Total Bilirubin: 0.5 mg/dL (ref 0.3–1.2)
Total Protein: 7.1 g/dL (ref 6.0–8.3)

## 2014-10-21 LAB — URINALYSIS, ROUTINE W REFLEX MICROSCOPIC
Bilirubin Urine: NEGATIVE
Glucose, UA: NEGATIVE mg/dL
Hgb urine dipstick: NEGATIVE
Ketones, ur: NEGATIVE mg/dL
Leukocytes, UA: NEGATIVE
Nitrite: NEGATIVE
Protein, ur: NEGATIVE mg/dL
Specific Gravity, Urine: 1.022 (ref 1.005–1.030)
Urobilinogen, UA: 1 mg/dL (ref 0.0–1.0)
pH: 5.5 (ref 5.0–8.0)

## 2014-10-21 LAB — PROTIME-INR
INR: 1.04 (ref 0.00–1.49)
Prothrombin Time: 13.8 seconds (ref 11.6–15.2)

## 2014-10-21 LAB — CBC
HCT: 41.9 % (ref 39.0–52.0)
Hemoglobin: 14.7 g/dL (ref 13.0–17.0)
MCH: 33.6 pg (ref 26.0–34.0)
MCHC: 35.1 g/dL (ref 30.0–36.0)
MCV: 95.9 fL (ref 78.0–100.0)
Platelets: 210 10*3/uL (ref 150–400)
RBC: 4.37 MIL/uL (ref 4.22–5.81)
RDW: 12.1 % (ref 11.5–15.5)
WBC: 11.3 10*3/uL — ABNORMAL HIGH (ref 4.0–10.5)

## 2014-10-21 NOTE — Pre-Procedure Instructions (Signed)
[name] [name]  10/21/2014   Your procedure is scheduled on:  Friday October 28, 2014 at 10:25 AM.  [facility] at 8:25 AM.  Call this number if you have problems the morning of surgery: [phone]   Call this number if you have any questions prior to surgery: [phone]   Remember:   Do not eat food or drink liquids after midnight.   Take these medicines the morning of surgery with A SIP OF WATER:   Esomeprazole (Nexium), Finasteride (Proscar), Hydrocodone if needed, and Restasis eye drops,    Please stop taking any herbals 7 days before your surgery (Ex. Fish oil, Tumeric, Celebrex)   Do not wear jewelry.  Do not wear lotions, powders, or cologne.   Men may shave face and neck.  Do not bring valuables to the hospital.  [facility] is not responsible for any belongings or valuables.               Contacts, dentures or bridgework may not be worn into surgery.  Leave suitcase in the car. After surgery it may be brought to your room.  For patients admitted to the hospital, discharge time is determined by your treatment team.               Patients discharged the day of surgery will not be allowed to drive home.  Name and phone number of your driver:   Special Instructions: Shower using CHG soap the night before and the morning of your surgery   Please read over the following fact sheets that you were given: Pain Booklet, Coughing and Deep Breathing, Total Joint Packet, MRSA Information and Surgical Site Infection Prevention

## 2014-10-21 NOTE — Progress Notes (Signed)
Patient denied having any cardiac or pulmonary issues. Patient voiced concerned about consent and stated that Dr. Ophelia CharterYates explained to him that the "ball would be replaced and that he was not having a total hip replacement." Nurse then called Elnita Maxwellheryl at Dr. Ophelia CharterYates office and patient requested to speak to her. Elnita MaxwellCheryl stated she would have Dr. Ophelia CharterYates call patient. Therefore consent form will be signed DOS.

## 2014-10-24 ENCOUNTER — Other Ambulatory Visit (HOSPITAL_COMMUNITY): Payer: BC Managed Care – PPO

## 2014-10-26 ENCOUNTER — Encounter: Payer: Self-pay | Admitting: Internal Medicine

## 2014-10-26 NOTE — H&P (Signed)
TOTAL HIP ADMISSION H&P  Patient is admitted for left total hip arthroplasty.  Subjective:  Chief Complaint: left hip pain  HPI: [name] [name], 63 y.o. male, has a history of pain and functional disability in the left hip(s) due to arthritis and patient has failed non-surgical conservative treatments for greater than 12 weeks to include NSAID's and/or analgesics, use of assistive devices and activity modification.  Onset of symptoms was gradual starting 2 years ago with gradually worsening course since that time.The patient noted no past surgery on the left hip(s).  Patient currently rates pain in the left hip at 9 out of 10 with activity. Patient has night pain, worsening of pain with activity and weight bearing, trendelenberg gait and pain that interfers with activities of daily living.    Plain radiographs showed some joint space narrowing previously.  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  His opposite right hip is normal.   This condition presents safety issues increasing the risk of falls.   There is no current active infection.  Patient Active Problem List   Diagnosis Date Noted  . Left hip pain 10/11/2014  . Insulin resistance 09/13/2014  . Medication management 09/12/2014  . Encounter for long-term (current) use of other medications 03/27/2014  . Blood glucose abnormal 03/27/2014  . Hypertension   . Hyperlipidemia   . GERD (gastroesophageal reflux disease)   . Vitamin D deficiency    Past Medical History  Diagnosis Date  . Hypertension   . Hyperlipidemia   . GERD (gastroesophageal reflux disease)   . Vitamin D deficiency   . Arthritis     right thumb  . Dry eyes     Takes Restasis    Past Surgical History  Procedure Laterality Date  . Esophagogastroduodenoscopy  1990  . Colonoscopy w/ polypectomy      No prescriptions  prior to admission   No Known Allergies  History  Substance Use Topics  . Smoking status: Never Smoker   . Smokeless tobacco: Not on file  . Alcohol Use: Yes     Comment: social    Family History  Problem Relation Age of Onset  . Osteoporosis Mother   . Schizophrenia Mother   . Heart disease Father   . COPD Father   . Diabetes Brother      Review of Systems  Musculoskeletal: Positive for joint pain.       Left  Hip pain.  All other systems reviewed and are negative.   Objective:  Physical Exam  Constitutional: He is oriented to person, place, and time. He appears well-developed and well-nourished.  HENT:  Head: Normocephalic and atraumatic.  Eyes: EOM are normal. Pupils are equal, round, and reactive to light.  Neck: Normal range of motion.  Cardiovascular: Normal rate.   Respiratory: Effort normal.  GI: Soft.  Musculoskeletal:  severe pain with 20 degrees of internal rotation left hip, pain with figure of four.  Distal pulses are 2+.  Negative popliteal compression test.  Reflexes are 2+ and symmetrical.  Neurological: He is alert and oriented to person, place, and time.  Skin: Skin is warm and dry.  Psychiatric: He has a normal mood and affect.    Vital signs in last 24 hours:    Labs:   Estimated body mass index is 23.57 kg/(m^2) as calculated from the following:   Height  as of 10/11/14: 5\' 8"  (1.727 m).   Weight as of 10/17/14: 70.308 kg (155 lb).   Imaging Review Plain radiographs demonstrate severe degenerative joint disease of the left hip(s). The bone quality appears to be adequate for age and reported activity level.  Assessment/Plan:  End stage arthritis, left hip(s)  The patient history, physical examination, clinical judgement of the provider and imaging studies are consistent with end stage degenerative joint disease of the left hip(s) and total hip arthroplasty is deemed medically necessary. The treatment options including medical management,  injection therapy, arthroscopy and arthroplasty were discussed at length. The risks and benefits of total hip arthroplasty were presented and reviewed. The risks due to aseptic loosening, infection, stiffness, dislocation/subluxation,  thromboembolic complications and other imponderables were discussed.  The patient acknowledged the explanation, agreed to proceed with the plan and consent was signed. Patient is being admitted for inpatient treatment for surgery, pain control, PT, OT, prophylactic antibiotics, VTE prophylaxis, progressive ambulation and ADL's and discharge planning.The patient is planning to be discharged home with home health services

## 2014-10-27 MED ORDER — CEFAZOLIN SODIUM-DEXTROSE 2-3 GM-% IV SOLR
2.0000 g | INTRAVENOUS | Status: AC
Start: 2014-10-28 — End: 2014-10-28
  Administered 2014-10-28: 2 g via INTRAVENOUS
  Filled 2014-10-27: qty 50

## 2014-10-27 MED ORDER — CHLORHEXIDINE GLUCONATE 4 % EX LIQD
60.0000 mL | Freq: Once | CUTANEOUS | Status: DC
  Filled 2014-10-27: qty 60

## 2014-10-28 ENCOUNTER — Inpatient Hospital Stay (HOSPITAL_COMMUNITY): Payer: BC Managed Care – PPO | Admitting: Anesthesiology

## 2014-10-28 ENCOUNTER — Inpatient Hospital Stay (HOSPITAL_COMMUNITY)
Admission: RE | Admit: 2014-10-28 | Discharge: 2014-10-29 | DRG: 470 | Disposition: A | Discharge status: Home Health | Payer: BC Managed Care – PPO | Source: Ambulatory Visit | Attending: Orthopaedic Surgery | Admitting: Orthopaedic Surgery

## 2014-10-28 ENCOUNTER — Encounter (HOSPITAL_COMMUNITY): Payer: Self-pay | Admitting: *Deleted

## 2014-10-28 ENCOUNTER — Inpatient Hospital Stay (HOSPITAL_COMMUNITY): Payer: BC Managed Care – PPO

## 2014-10-28 ENCOUNTER — Encounter (HOSPITAL_COMMUNITY)
Admission: RE | Disposition: A | Discharge status: Home Health | Payer: Self-pay | Source: Ambulatory Visit | Attending: Orthopaedic Surgery

## 2014-10-28 DIAGNOSIS — M1612 Unilateral primary osteoarthritis, left hip: Secondary | ICD-10-CM | POA: Diagnosis present

## 2014-10-28 DIAGNOSIS — K219 Gastro-esophageal reflux disease without esophagitis: Secondary | ICD-10-CM | POA: Diagnosis present

## 2014-10-28 DIAGNOSIS — Z7982 Long term (current) use of aspirin: Secondary | ICD-10-CM

## 2014-10-28 DIAGNOSIS — I10 Essential (primary) hypertension: Secondary | ICD-10-CM | POA: Diagnosis present

## 2014-10-28 DIAGNOSIS — M87852 Other osteonecrosis, left femur: Secondary | ICD-10-CM | POA: Diagnosis present

## 2014-10-28 DIAGNOSIS — D62 Acute posthemorrhagic anemia: Secondary | ICD-10-CM | POA: Diagnosis not present

## 2014-10-28 DIAGNOSIS — M87052 Idiopathic aseptic necrosis of left femur: Secondary | ICD-10-CM

## 2014-10-28 DIAGNOSIS — Z79899 Other long term (current) drug therapy: Secondary | ICD-10-CM

## 2014-10-28 DIAGNOSIS — Z419 Encounter for procedure for purposes other than remedying health state, unspecified: Secondary | ICD-10-CM

## 2014-10-28 DIAGNOSIS — E785 Hyperlipidemia, unspecified: Secondary | ICD-10-CM | POA: Diagnosis present

## 2014-10-28 DIAGNOSIS — M25552 Pain in left hip: Secondary | ICD-10-CM | POA: Diagnosis present

## 2014-10-28 HISTORY — PX: TOTAL HIP ARTHROPLASTY: SHX124

## 2014-10-28 HISTORY — DX: Idiopathic aseptic necrosis of left femur: M87.052

## 2014-10-28 SURGERY — ARTHROPLASTY, HIP, TOTAL, ANTERIOR APPROACH
Anesthesia: General | Site: Hip | Laterality: Left

## 2014-10-28 MED ORDER — OXYCODONE HCL 5 MG PO TABS
5.0000 mg | ORAL_TABLET | Freq: Once | ORAL | Status: AC | PRN
  Administered 2014-10-28: 5 mg via ORAL

## 2014-10-28 MED ORDER — FINASTERIDE 5 MG PO TABS
5.0000 mg | ORAL_TABLET | Freq: Every day | ORAL | Status: DC
  Filled 2014-10-28: qty 1

## 2014-10-28 MED ORDER — ATENOLOL 100 MG PO TABS
100.0000 mg | ORAL_TABLET | ORAL | Status: DC
  Administered 2014-10-28: 100 mg via ORAL
  Filled 2014-10-28 (×2): qty 1

## 2014-10-28 MED ORDER — SENNOSIDES-DOCUSATE SODIUM 8.6-50 MG PO TABS: 1.0000 | ORAL_TABLET | Freq: Every evening | ORAL | Status: DC | PRN

## 2014-10-28 MED ORDER — ZOLPIDEM TARTRATE 5 MG PO TABS
5.0000 mg | ORAL_TABLET | Freq: Every evening | ORAL | Status: DC | PRN
  Administered 2014-10-28: 5 mg via ORAL
  Filled 2014-10-28: qty 1

## 2014-10-28 MED ORDER — KCL IN DEXTROSE-NACL 20-5-0.45 MEQ/L-%-% IV SOLN
INTRAVENOUS | Status: DC
  Administered 2014-10-28 – 2014-10-29 (×2): via INTRAVENOUS
  Filled 2014-10-28 (×3): qty 1000

## 2014-10-28 MED ORDER — FLEET ENEMA 7-19 GM/118ML RE ENEM: 1.0000 | ENEMA | Freq: Once | RECTAL | Status: AC | PRN

## 2014-10-28 MED ORDER — LISINOPRIL 20 MG PO TABS
20.0000 mg | ORAL_TABLET | Freq: Every day | ORAL | Status: DC
  Filled 2014-10-28: qty 1

## 2014-10-28 MED ORDER — PROPOFOL 10 MG/ML IV BOLUS
INTRAVENOUS | Status: DC | PRN
  Administered 2014-10-28: 150 mg via INTRAVENOUS

## 2014-10-28 MED ORDER — ASPIRIN EC 325 MG PO TBEC
325.0000 mg | DELAYED_RELEASE_TABLET | Freq: Every day | ORAL | Status: DC
  Administered 2014-10-29: 325 mg via ORAL
  Filled 2014-10-28 (×2): qty 1

## 2014-10-28 MED ORDER — CELECOXIB 200 MG PO CAPS
200.0000 mg | ORAL_CAPSULE | Freq: Every day | ORAL | Status: DC
Start: 2014-10-28 — End: 2014-10-29
  Administered 2014-10-28 – 2014-10-29 (×2): 200 mg via ORAL
  Filled 2014-10-28 (×2): qty 1

## 2014-10-28 MED ORDER — LIDOCAINE HCL (CARDIAC) 20 MG/ML IV SOLN
INTRAVENOUS | Status: DC | PRN
  Administered 2014-10-28: 70 mg via INTRAVENOUS

## 2014-10-28 MED ORDER — 0.9 % SODIUM CHLORIDE (POUR BTL) OPTIME
TOPICAL | Status: DC | PRN
  Administered 2014-10-28: 1000 mL

## 2014-10-28 MED ORDER — PROPOFOL 10 MG/ML IV BOLUS
INTRAVENOUS | Status: AC
  Filled 2014-10-28: qty 20

## 2014-10-28 MED ORDER — METHOCARBAMOL 1000 MG/10ML IJ SOLN
500.0000 mg | INTRAVENOUS | Status: AC
  Administered 2014-10-28: 500 mg via INTRAVENOUS
  Filled 2014-10-28: qty 5

## 2014-10-28 MED ORDER — ACETAMINOPHEN 325 MG PO TABS: 650.0000 mg | ORAL_TABLET | Freq: Four times a day (QID) | ORAL | Status: DC | PRN

## 2014-10-28 MED ORDER — NEOSTIGMINE METHYLSULFATE 10 MG/10ML IV SOLN
INTRAVENOUS | Status: AC
  Filled 2014-10-28: qty 1

## 2014-10-28 MED ORDER — CEFAZOLIN SODIUM-DEXTROSE 2-3 GM-% IV SOLR
INTRAVENOUS | Status: AC
  Filled 2014-10-28: qty 50

## 2014-10-28 MED ORDER — ROCURONIUM BROMIDE 100 MG/10ML IV SOLN
INTRAVENOUS | Status: DC | PRN
  Administered 2014-10-28: 50 mg via INTRAVENOUS
  Administered 2014-10-28: 10 mg via INTRAVENOUS
  Administered 2014-10-28: 5 mg via INTRAVENOUS

## 2014-10-28 MED ORDER — PHENYLEPHRINE HCL 10 MG/ML IJ SOLN
INTRAMUSCULAR | Status: DC | PRN
  Administered 2014-10-28 (×6): 80 ug via INTRAVENOUS

## 2014-10-28 MED ORDER — HYDROMORPHONE HCL 1 MG/ML IJ SOLN
INTRAMUSCULAR | Status: AC
  Filled 2014-10-28: qty 1

## 2014-10-28 MED ORDER — PHENYLEPHRINE 40 MCG/ML (10ML) SYRINGE FOR IV PUSH (FOR BLOOD PRESSURE SUPPORT)
PREFILLED_SYRINGE | INTRAVENOUS | Status: AC
  Filled 2014-10-28: qty 10

## 2014-10-28 MED ORDER — OXYCODONE HCL 5 MG/5ML PO SOLN: 5.0000 mg | Freq: Once | ORAL | Status: AC | PRN

## 2014-10-28 MED ORDER — METOCLOPRAMIDE HCL 10 MG PO TABS: 5.0000 mg | ORAL_TABLET | Freq: Three times a day (TID) | ORAL | Status: DC | PRN

## 2014-10-28 MED ORDER — LISINOPRIL-HYDROCHLOROTHIAZIDE 20-25 MG PO TABS: 1.0000 | ORAL_TABLET | Freq: Every day | ORAL | Status: DC

## 2014-10-28 MED ORDER — NEOSTIGMINE METHYLSULFATE 10 MG/10ML IV SOLN
INTRAVENOUS | Status: DC | PRN
  Administered 2014-10-28: 3 mg via INTRAVENOUS

## 2014-10-28 MED ORDER — LACTATED RINGERS IV SOLN
INTRAVENOUS | Status: DC | PRN
  Administered 2014-10-28 (×2): via INTRAVENOUS

## 2014-10-28 MED ORDER — HYDROMORPHONE HCL 1 MG/ML IJ SOLN
INTRAMUSCULAR | Status: AC
  Administered 2014-10-28: 0.5 mg via INTRAVENOUS
  Filled 2014-10-28: qty 1

## 2014-10-28 MED ORDER — ONDANSETRON HCL 4 MG/2ML IJ SOLN
INTRAMUSCULAR | Status: AC
  Filled 2014-10-28: qty 2

## 2014-10-28 MED ORDER — FENTANYL CITRATE 0.05 MG/ML IJ SOLN
INTRAMUSCULAR | Status: DC | PRN
  Administered 2014-10-28 (×2): 50 ug via INTRAVENOUS
  Administered 2014-10-28: 100 ug via INTRAVENOUS
  Administered 2014-10-28: 50 ug via INTRAVENOUS

## 2014-10-28 MED ORDER — ACETAMINOPHEN 650 MG RE SUPP: 650.0000 mg | Freq: Four times a day (QID) | RECTAL | Status: DC | PRN

## 2014-10-28 MED ORDER — ASPIRIN EC 325 MG PO TBEC: 325.0000 mg | DELAYED_RELEASE_TABLET | Freq: Every day | ORAL | Status: DC

## 2014-10-28 MED ORDER — ALBUMIN HUMAN 5 % IV SOLN
INTRAVENOUS | Status: DC | PRN
  Administered 2014-10-28: 10:00:00 via INTRAVENOUS

## 2014-10-28 MED ORDER — MIDAZOLAM HCL 2 MG/2ML IJ SOLN
INTRAMUSCULAR | Status: AC
  Filled 2014-10-28: qty 2

## 2014-10-28 MED ORDER — FENTANYL CITRATE 0.05 MG/ML IJ SOLN
INTRAMUSCULAR | Status: AC
  Filled 2014-10-28: qty 5

## 2014-10-28 MED ORDER — DEXAMETHASONE SODIUM PHOSPHATE 4 MG/ML IJ SOLN
INTRAMUSCULAR | Status: DC | PRN
  Administered 2014-10-28: 4 mg via INTRAVENOUS

## 2014-10-28 MED ORDER — GLYCOPYRROLATE 0.2 MG/ML IJ SOLN
INTRAMUSCULAR | Status: AC
  Filled 2014-10-28: qty 2

## 2014-10-28 MED ORDER — KETOROLAC TROMETHAMINE 30 MG/ML IJ SOLN
30.0000 mg | Freq: Once | INTRAMUSCULAR | Status: AC
  Administered 2014-10-28: 30 mg via INTRAVENOUS

## 2014-10-28 MED ORDER — MORPHINE SULFATE 2 MG/ML IJ SOLN
2.0000 mg | INTRAMUSCULAR | Status: DC | PRN
  Administered 2014-10-28 – 2014-10-29 (×2): 2 mg via INTRAVENOUS
  Filled 2014-10-28 (×2): qty 1

## 2014-10-28 MED ORDER — DEXTROSE 5 % IV SOLN
500.0000 mg | Freq: Four times a day (QID) | INTRAVENOUS | Status: DC | PRN
  Filled 2014-10-28: qty 5

## 2014-10-28 MED ORDER — METHOCARBAMOL 500 MG PO TABS
500.0000 mg | ORAL_TABLET | Freq: Four times a day (QID) | ORAL | Status: DC | PRN
  Administered 2014-10-28 – 2014-10-29 (×3): 500 mg via ORAL
  Filled 2014-10-28 (×3): qty 1

## 2014-10-28 MED ORDER — DOCUSATE SODIUM 100 MG PO CAPS
100.0000 mg | ORAL_CAPSULE | Freq: Two times a day (BID) | ORAL | Status: DC
  Administered 2014-10-28 (×2): 100 mg via ORAL
  Filled 2014-10-28 (×4): qty 1

## 2014-10-28 MED ORDER — OXYCODONE HCL 5 MG PO TABS
ORAL_TABLET | ORAL | Status: AC
  Administered 2014-10-28: 10 mg
  Filled 2014-10-28: qty 1

## 2014-10-28 MED ORDER — OXYCODONE-ACETAMINOPHEN 5-325 MG PO TABS: 1.0000 | ORAL_TABLET | ORAL | Status: DC | PRN

## 2014-10-28 MED ORDER — VALACYCLOVIR HCL 500 MG PO TABS
500.0000 mg | ORAL_TABLET | Freq: Two times a day (BID) | ORAL | Status: DC
  Administered 2014-10-28 – 2014-10-29 (×2): 500 mg via ORAL
  Filled 2014-10-28 (×3): qty 1

## 2014-10-28 MED ORDER — METHOCARBAMOL 500 MG PO TABS: 500.0000 mg | ORAL_TABLET | Freq: Four times a day (QID) | ORAL | Status: DC | PRN

## 2014-10-28 MED ORDER — PHENOL 1.4 % MT LIQD: 1.0000 | OROMUCOSAL | Status: DC | PRN

## 2014-10-28 MED ORDER — HYDROMORPHONE HCL 1 MG/ML IJ SOLN
0.2500 mg | INTRAMUSCULAR | Status: DC | PRN
  Administered 2014-10-28 (×7): 0.5 mg via INTRAVENOUS

## 2014-10-28 MED ORDER — LACTATED RINGERS IV SOLN
INTRAVENOUS | Status: DC
  Administered 2014-10-28: 08:00:00 via INTRAVENOUS

## 2014-10-28 MED ORDER — PANTOPRAZOLE SODIUM 40 MG PO TBEC
80.0000 mg | DELAYED_RELEASE_TABLET | Freq: Every day | ORAL | Status: DC
  Administered 2014-10-28 – 2014-10-29 (×2): 80 mg via ORAL
  Filled 2014-10-28 (×2): qty 2

## 2014-10-28 MED ORDER — DIPHENHYDRAMINE HCL 12.5 MG/5ML PO ELIX: 12.5000 mg | ORAL_SOLUTION | ORAL | Status: DC | PRN

## 2014-10-28 MED ORDER — BISACODYL 10 MG RE SUPP: 10.0000 mg | Freq: Every day | RECTAL | Status: DC | PRN

## 2014-10-28 MED ORDER — ONDANSETRON HCL 4 MG/2ML IJ SOLN: 4.0000 mg | Freq: Four times a day (QID) | INTRAMUSCULAR | Status: DC | PRN

## 2014-10-28 MED ORDER — GLYCOPYRROLATE 0.2 MG/ML IJ SOLN
INTRAMUSCULAR | Status: DC | PRN
  Administered 2014-10-28: 0.4 mg via INTRAVENOUS
  Administered 2014-10-28: 0.2 mg via INTRAVENOUS

## 2014-10-28 MED ORDER — MENTHOL 3 MG MT LOZG: 1.0000 | LOZENGE | OROMUCOSAL | Status: DC | PRN

## 2014-10-28 MED ORDER — MIDAZOLAM HCL 2 MG/2ML IJ SOLN
2.0000 mg | Freq: Once | INTRAMUSCULAR | Status: AC
  Administered 2014-10-28: 2 mg via INTRAVENOUS

## 2014-10-28 MED ORDER — ARTIFICIAL TEARS OP OINT
TOPICAL_OINTMENT | OPHTHALMIC | Status: AC
  Filled 2014-10-28: qty 3.5

## 2014-10-28 MED ORDER — METOCLOPRAMIDE HCL 5 MG/ML IJ SOLN: 5.0000 mg | Freq: Three times a day (TID) | INTRAMUSCULAR | Status: DC | PRN

## 2014-10-28 MED ORDER — ROCURONIUM BROMIDE 50 MG/5ML IV SOLN
INTRAVENOUS | Status: AC
  Filled 2014-10-28: qty 1

## 2014-10-28 MED ORDER — OXYCODONE HCL 5 MG PO TABS
5.0000 mg | ORAL_TABLET | ORAL | Status: DC | PRN
  Administered 2014-10-28 – 2014-10-29 (×5): 10 mg via ORAL
  Filled 2014-10-28 (×6): qty 2

## 2014-10-28 MED ORDER — PRAVASTATIN SODIUM 40 MG PO TABS
40.0000 mg | ORAL_TABLET | Freq: Every day | ORAL | Status: DC
  Administered 2014-10-28: 40 mg via ORAL
  Filled 2014-10-28 (×2): qty 1

## 2014-10-28 MED ORDER — MIDAZOLAM HCL 5 MG/5ML IJ SOLN
INTRAMUSCULAR | Status: DC | PRN
  Administered 2014-10-28: 1 mg via INTRAVENOUS

## 2014-10-28 MED ORDER — CYCLOSPORINE 0.05 % OP EMUL
1.0000 [drp] | Freq: Two times a day (BID) | OPHTHALMIC | Status: DC
  Filled 2014-10-28 (×3): qty 1

## 2014-10-28 MED ORDER — ONDANSETRON HCL 4 MG PO TABS: 4.0000 mg | ORAL_TABLET | Freq: Four times a day (QID) | ORAL | Status: DC | PRN

## 2014-10-28 MED ORDER — TRANEXAMIC ACID 100 MG/ML IV SOLN
1000.0000 mg | INTRAVENOUS | Status: AC
  Administered 2014-10-28: 1000 mg via INTRAVENOUS
  Filled 2014-10-28: qty 10

## 2014-10-28 MED ORDER — KETOROLAC TROMETHAMINE 30 MG/ML IJ SOLN
INTRAMUSCULAR | Status: AC
  Filled 2014-10-28: qty 1

## 2014-10-28 MED ORDER — HYDROCHLOROTHIAZIDE 25 MG PO TABS
25.0000 mg | ORAL_TABLET | Freq: Every day | ORAL | Status: DC
  Filled 2014-10-28: qty 1

## 2014-10-28 MED ORDER — VITAMIN D3 25 MCG (1000 UNIT) PO TABS
5000.0000 [IU] | ORAL_TABLET | Freq: Every day | ORAL | Status: DC
  Administered 2014-10-29: 5000 [IU] via ORAL
  Filled 2014-10-28: qty 5

## 2014-10-28 MED ORDER — ONDANSETRON HCL 4 MG/2ML IJ SOLN
INTRAMUSCULAR | Status: DC | PRN
  Administered 2014-10-28: 4 mg via INTRAVENOUS

## 2014-10-28 SURGICAL SUPPLY — 52 items
ADH SKN CLS APL DERMABOND .7 (GAUZE/BANDAGES/DRESSINGS) ×1
BLADE SAW SGTL 18X1.27X75 (BLADE) ×2 IMPLANT
BLADE SURG ROTATE 9660 (MISCELLANEOUS) IMPLANT
CANISTER SUCTION WELLS/JOHNSON (MISCELLANEOUS) ×1 IMPLANT
CAPT HIP PF COP ×1 IMPLANT
CELLS DAT CNTRL 66122 CELL SVR (MISCELLANEOUS) ×1 IMPLANT
COVER SURGICAL LIGHT HANDLE (MISCELLANEOUS) ×2 IMPLANT
DERMABOND ADVANCED (GAUZE/BANDAGES/DRESSINGS) ×1
DERMABOND ADVANCED .7 DNX12 (GAUZE/BANDAGES/DRESSINGS) ×1 IMPLANT
DRAPE C-ARM 42X72 X-RAY (DRAPES) ×2 IMPLANT
DRAPE IMP U-DRAPE 54X76 (DRAPES) ×2 IMPLANT
DRAPE STERI IOBAN 125X83 (DRAPES) ×2 IMPLANT
DRAPE U-SHAPE 47X51 STRL (DRAPES) ×6 IMPLANT
DRSG MEPILEX BORDER 4X8 (GAUZE/BANDAGES/DRESSINGS) ×2 IMPLANT
DURAPREP 26ML APPLICATOR (WOUND CARE) ×2 IMPLANT
ELECT BLADE 4.0 EZ CLEAN MEGAD (MISCELLANEOUS) ×2
ELECT CAUTERY BLADE 6.4 (BLADE) ×1 IMPLANT
ELECT REM PT RETURN 9FT ADLT (ELECTROSURGICAL) ×2
ELECTRODE BLDE 4.0 EZ CLN MEGD (MISCELLANEOUS) IMPLANT
ELECTRODE REM PT RTRN 9FT ADLT (ELECTROSURGICAL) ×1 IMPLANT
FACESHIELD WRAPAROUND (MASK) IMPLANT
FACESHIELD WRAPAROUND OR TEAM (MASK) ×2 IMPLANT
GLOVE BIOGEL PI IND STRL 7.5 (GLOVE) ×1 IMPLANT
GLOVE BIOGEL PI IND STRL 8 (GLOVE) ×1 IMPLANT
GLOVE BIOGEL PI INDICATOR 7.5 (GLOVE) ×1
GLOVE BIOGEL PI INDICATOR 8 (GLOVE) ×1
GLOVE ECLIPSE 7.0 STRL STRAW (GLOVE) ×3 IMPLANT
GLOVE ORTHO TXT STRL SZ7.5 (GLOVE) ×4 IMPLANT
GOWN STRL REUS W/ TWL LRG LVL3 (GOWN DISPOSABLE) ×2 IMPLANT
GOWN STRL REUS W/ TWL XL LVL3 (GOWN DISPOSABLE) ×1 IMPLANT
GOWN STRL REUS W/TWL LRG LVL3 (GOWN DISPOSABLE) ×6
GOWN STRL REUS W/TWL XL LVL3 (GOWN DISPOSABLE) ×2
KIT BASIN OR (CUSTOM PROCEDURE TRAY) ×2 IMPLANT
KIT ROOM TURNOVER OR (KITS) ×2 IMPLANT
MANIFOLD NEPTUNE II (INSTRUMENTS) ×1 IMPLANT
NS IRRIG 1000ML POUR BTL (IV SOLUTION) ×2 IMPLANT
PACK TOTAL JOINT (CUSTOM PROCEDURE TRAY) ×2 IMPLANT
PACK UNIVERSAL I (CUSTOM PROCEDURE TRAY) ×2 IMPLANT
PAD ARMBOARD 7.5X6 YLW CONV (MISCELLANEOUS) ×4 IMPLANT
RETRACTOR WND ALEXIS 18 MED (MISCELLANEOUS) ×1 IMPLANT
RTRCTR WOUND ALEXIS 18CM MED (MISCELLANEOUS) ×2
SPECIMEN JAR MEDIUM (MISCELLANEOUS) ×1 IMPLANT
SUT ETHIBOND NAB CT1 #1 30IN (SUTURE) IMPLANT
SUT VIC AB 0 CT1 27 (SUTURE) ×2
SUT VIC AB 0 CT1 27XBRD ANBCTR (SUTURE) ×1 IMPLANT
SUT VIC AB 2-0 CT1 27 (SUTURE) ×2
SUT VIC AB 2-0 CT1 TAPERPNT 27 (SUTURE) ×1 IMPLANT
SUT VICRYL 4-0 PS2 18IN ABS (SUTURE) ×2 IMPLANT
SUT VLOC 180 0 24IN GS25 (SUTURE) ×2 IMPLANT
TOWEL OR 17X24 6PK STRL BLUE (TOWEL DISPOSABLE) ×2 IMPLANT
TOWEL OR 17X26 10 PK STRL BLUE (TOWEL DISPOSABLE) ×4 IMPLANT
TRAY FOLEY CATH 16FRSI W/METER (SET/KITS/TRAYS/PACK) IMPLANT

## 2014-10-28 NOTE — Anesthesia Postprocedure Evaluation (Signed)
Anesthesia Post Note  Patient: [name] [name]  Procedure(s) Performed: Procedure(s) (LRB): TOTAL HIP ARTHROPLASTY ANTERIOR APPROACH (Left)  Anesthesia type: General  Patient location: PACU  Post pain: Pain level controlled and Adequate analgesia  Post assessment: Post-op Vital signs reviewed, Patient's Cardiovascular Status Stable, Respiratory Function Stable, Patent Airway and Pain level controlled  Last Vitals:  Filed Vitals:   10/28/14 1420  BP: 90/52  Pulse: 73  Temp: 36.2 C  Resp: 14    Post vital signs: Reviewed and stable  Level of consciousness: awake, alert  and oriented  Complications: No apparent anesthesia complications

## 2014-10-28 NOTE — Op Note (Signed)
[name] [name]:  [name], [name] [name]            ACCOUNT NO.:  [id number]  MEDICAL RECORD NO.:  [id number]  LOCATION:  [facility]                         FACILITY:  [facility]  PHYSICIAN:  [name] [name], M.D.    DATE OF BIRTH:  1951-01-16  DATE OF PROCEDURE:  10/28/2014 DATE OF DISCHARGE:                              OPERATIVE REPORT   PREOPERATIVE DIAGNOSES:  Left hip osteoarthritis with avascular necrosis, femoral head.  POSTOPERATIVE DIAGNOSES:  Left hip osteoarthritis with avascular necrosis, femoral head.  PROCEDURE:  Left total hip arthroplasty.  SURGEON:  [name] [name], M.D.  ASSISTANT:  [name] DeedSheila Vernon, PA-C, medically necessary and present for the entire procedure.  EBL:  350 mL.  ANESTHESIA:  General plus Marcaine skin local.  COMPONENTS:  DePuy size 12 KA standard offset Corail system, 52-mm cup, +4 neutral poly liner, 36 mm ceramic ball, +1.5 mm neck length.  DESCRIPTION OF PROCEDURE:  After induction of general anesthesia, the patient was placed on the Hana table, supine position, standard prepping and draping was performed after C-arm was brought and confirmed good visualization and leg-length measurements.  The patient was measured and was short on the involved side.  Prepping and draping with DuraPrep. Preoperative Ancef prophylaxis.  Time-out procedure.  Large shower curtain Betadine, Steri-Drape was applied as well as the standard straight drapes.  Sterile skin marker on the outside of the Steri-Drape and incision was made after outlining trochanter and iliac crest for direct anterior approach starting 2 cm lateral, 2 cm distal to the ASIS extending down obliquely over the trochanter.  Subcu was elevated off the fascia.  Fascia was split and then the skin protector ring was inserted with typical difficulty.  Once it was in, blunt dissection underneath the fascia medially to the appropriate interval down to soft tissue overlying the femoral neck.  Coagulation of transverse  bleeders, opening of the next typical clear liquid fluid out of the hip joint. Dolphin __________ covers were placed inside the capsule around the neck and the saw was placed on the neck and checked under C-arm visualization and then cut appropriately.  It was finished laterally and proximal with the osteotome to protect the greater trochanter.  Leg __________ was removed, excess remaining pieces of bone removed.  Sequential reaming up to 51, 52 permanent press-fit cup was inserted.  Central __________ was filled once it was confirmed under C-arm as it was finally tapped down that was completely down, lateralized appropriately, good coverage, good cut flexion and abduction.  Posterior capsule was excised with the Bovie and then leg after placing the hydraulic curve was taken down and under __________ the femur prepared.  Sequential progression up to 11, it was difficult to get the 11 broach out, I was not sure I could get the 12 broach out since it was hanging up laterally in the trochanter, a 12 stem was selected, neck cut was 11 mm above the lesser trochanter appropriate and the 12 stem was impacted down, checked under C-arm, good position slightly, 2 mm proud, trials were tried, still little bit long, good stability and then the stem was packed down with difficulty until the collar was on the neck.  Final 2 mm switch took  the large mallet and considerable force to force the stem down tightly.  Calcar was intact. Stem was down, +1.5 ball on the 12 KA stem gave restoration of leg length, good stability, extension to 45, external rotation to 90 degrees, good stability __________.  Permanent ceramic +1.5 ball was then inserted after irrigation and cleaning of the trunnion.  Identical findings of stability, final spot pictures were taken for documentation and then standard closure with V-Loc in the fascia, 2-0 Vicryl subcutaneous tissue, subcuticular skin closure, Dermabond,  postop dressing, and transferred to the recovery room.  Instrument count and needle count was correct.     [name] [name], M.D.     MCY/MEDQ  D:  10/28/2014  T:  10/28/2014  Job:  [id number]

## 2014-10-28 NOTE — Brief Op Note (Cosign Needed)
10/28/2014  12:13 PM  PATIENT:  [name] [name]  63 y.o. male  PRE-OPERATIVE DIAGNOSIS:  Left Hip Avascular Necrosis  POST-OPERATIVE DIAGNOSIS:  Left Hip Avascular Necrosis  PROCEDURE:  Procedure(s): TOTAL HIP ARTHROPLASTY ANTERIOR APPROACH (Left)  SURGEON:  Surgeon(s) and Role:    * Eldred MangesMark C Yates, MD - Primary  PHYSICIAN ASSISTANT: Maud DeedSheila Elley Harp [facility]  ASSISTANTS: none   ANESTHESIA:   general  EBL:  Total I/O In: 1650 [I.V.:1400; IV Piggyback:250] Out: 350 [Blood:350]  BLOOD ADMINISTERED:none  DRAINS: none   LOCAL MEDICATIONS USED:  NONE  SPECIMEN:  No Specimen and Source of Specimen:  femoral head  DISPOSITION OF SPECIMEN:  PATHOLOGY  COUNTS:  YES  TOURNIQUET:  * No tourniquets in log *  DICTATION: .Note written in EPIC  PLAN OF CARE: Admit to inpatient   PATIENT DISPOSITION:  PACU - hemodynamically stable.   Delay start of Pharmacological VTE agent (>24hrs) due to surgical blood loss or risk of bleeding: no

## 2014-10-28 NOTE — Discharge Instructions (Signed)
Keep hip incision dry for 5 days post op then may wet while bathing. °Therapy daily . °Call if fever or chills or increased drainage. °Go to ER if acutely short of breath or call for ambulance. °Return for follow up in 2 weeks. °May full weight bear on the surgical leg unless told otherwise. °In house walking for first 2 weeks.  ° °

## 2014-10-28 NOTE — Transfer of Care (Signed)
Immediate Anesthesia Transfer of Care Note  Patient: [name] [name]  Procedure(s) Performed: Procedure(s): TOTAL HIP ARTHROPLASTY ANTERIOR APPROACH (Left)  Patient Location: PACU  Anesthesia Type:General  Level of Consciousness: awake, alert  and oriented  Airway & Oxygen Therapy: Patient Spontanous Breathing and Patient connected to face mask oxygen  Post-op Assessment: Report given to PACU RN  Post vital signs: Reviewed and stable  Complications: No apparent anesthesia complications

## 2014-10-28 NOTE — Plan of Care (Signed)
Problem: Consults Goal: Diagnosis- Total Joint Replacement Primary Total Hip Left     

## 2014-10-28 NOTE — Anesthesia Preprocedure Evaluation (Signed)
Anesthesia Evaluation  Patient identified by MRN, date of birth, ID band Patient awake    Reviewed: Allergy & Precautions, H&P , NPO status , Patient's Chart, lab work & pertinent test results  Airway Mallampati: II   Neck ROM: full    Dental   Pulmonary neg pulmonary ROS,          Cardiovascular hypertension,     Neuro/Psych    GI/Hepatic GERD-  ,  Endo/Other    Renal/GU      Musculoskeletal  (+) Arthritis -,   Abdominal   Peds  Hematology   Anesthesia Other Findings   Reproductive/Obstetrics                             Anesthesia Physical Anesthesia Plan  ASA: II  Anesthesia Plan: General   Post-op Pain Management:    Induction: Intravenous  Airway Management Planned: Oral ETT  Additional Equipment:   Intra-op Plan:   Post-operative Plan: Extubation in OR  Informed Consent: I have reviewed the patients History and Physical, chart, labs and discussed the procedure including the risks, benefits and alternatives for the proposed anesthesia with the patient or authorized representative who has indicated his/her understanding and acceptance.     Plan Discussed with: CRNA, Anesthesiologist and Surgeon  Anesthesia Plan Comments:         Anesthesia Quick Evaluation  

## 2014-10-28 NOTE — Interval H&P Note (Signed)
History and Physical Interval Note:  10/28/2014 7:24 AM  [name] [name]  has presented today for surgery, with the diagnosis of Left Hip Avascular Necrosis  The various methods of treatment have been discussed with the patient and family. After consideration of risks, benefits and other options for treatment, the patient has consented to  Procedure(s): TOTAL HIP ARTHROPLASTY ANTERIOR APPROACH (Left) as a surgical intervention .  The patient's history has been reviewed, patient examined, no change in status, stable for surgery.  I have reviewed the patient's chart and labs.  Questions were answered to the patient's satisfaction.     Ramal Eckhardt C

## 2014-10-28 NOTE — Progress Notes (Signed)
Pt continuing to feel intense 10/10, has rec'd 2 mg IV Dilaudid and reports no relief, Dr.Hodierne notified, orders rec'd to continue with another 2mg  IV Dialudid, 0.5mg  at a time, IV Robaxin initiated, will cont to assess pain closely.

## 2014-10-29 LAB — BASIC METABOLIC PANEL
Anion gap: 11 (ref 5–15)
BUN: 19 mg/dL (ref 6–23)
CO2: 28 mEq/L (ref 19–32)
Calcium: 8.5 mg/dL (ref 8.4–10.5)
Chloride: 99 mEq/L (ref 96–112)
Creatinine, Ser: 1.05 mg/dL (ref 0.50–1.35)
GFR calc Af Amer: 86 mL/min — ABNORMAL LOW (ref >90)
GFR calc non Af Amer: 74 mL/min — ABNORMAL LOW (ref >90)
Glucose, Bld: 112 mg/dL — ABNORMAL HIGH (ref 70–99)
Potassium: 4.1 mEq/L (ref 3.7–5.3)
Sodium: 138 mEq/L (ref 137–147)

## 2014-10-29 LAB — CBC
HCT: 29.8 % — ABNORMAL LOW (ref 39.0–52.0)
Hemoglobin: 10.4 g/dL — ABNORMAL LOW (ref 13.0–17.0)
MCH: 32.4 pg (ref 26.0–34.0)
MCHC: 34.9 g/dL (ref 30.0–36.0)
MCV: 92.8 fL (ref 78.0–100.0)
Platelets: 188 10*3/uL (ref 150–400)
RBC: 3.21 MIL/uL — ABNORMAL LOW (ref 4.22–5.81)
RDW: 12.1 % (ref 11.5–15.5)
WBC: 6 10*3/uL (ref 4.0–10.5)

## 2014-10-29 NOTE — Progress Notes (Signed)
   Subjective:  Patient reports pain as moderate.  No events.  Objective:   VITALS:   Filed Vitals:   10/28/14 1654 10/28/14 2034 10/29/14 0206 10/29/14 0623  BP: 82/50 101/49 90/58 106/41  Pulse: 70 71 57 56  Temp: 97.6 F (36.4 C) 98.1 F (36.7 C) 98.6 F (37 C) 98.4 F (36.9 C)  TempSrc: Oral Axillary Oral Oral  Resp: 16     Height:      Weight:      SpO2: 98% 98% 97% 96%    Neurologically intact Neurovascular intact Sensation intact distally Intact pulses distally Dorsiflexion/Plantar flexion intact Incision: dressing C/D/I and no drainage No cellulitis present Compartment soft   Lab Results  Component Value Date   WBC 6.0 10/29/2014   HGB 10.4* 10/29/2014   HCT 29.8* 10/29/2014   MCV 92.8 10/29/2014   PLT 188 10/29/2014     Assessment/Plan:  1 Day Post-Op   - Expected postop acute blood loss anemia - will monitor for symptoms - Up with PT/OT - DVT ppx - SCDs, ambulation, asa - WBAT left lower extremity - Pain control - Discharge planning  Cheral AlmasXu, Naiping Michael 10/29/2014, 8:15 AM [phone]

## 2014-10-29 NOTE — Care Management Note (Signed)
CARE MANAGEMENT NOTE 10/29/2014  Patient:  [name] [name],[name] [name]   Account Number:  [id number]  Date Initiated:  10/29/2014  Documentation initiated by:  Georgianne FickADEKUNLE,Arlee Bossard  Subjective/Objective Assessment:   A 63 yo male, admitted with Left Hip Avascular Necrosis, s/p Left Total Hip Arthroplasty.     Action/Plan:   Case Manager spoke with patient about choices of Home Health Needs.  [facility] [place].  Patient says he already has walker and support at home.   Anticipated DC Date:  10/30/2014   Anticipated DC Plan:  HOME W HOME HEALTH SERVICES  In-house referral  NA      DC Planning Services  CM consult      [facility]  [facility]   Choice offered to / List presented to:  C-1 Patient   DME arranged  NA        HH arranged  HH-2 PT      HH agency  [facility]   Status of service:  Completed, signed off Medicare Important Message given?   (If response is "NO", the following Medicare IM given date fields will be blank) Date Medicare IM given:   Medicare IM given by:   Date Additional Medicare IM given:   Additional Medicare IM given by:    Discharge Disposition:  HOME W HOME HEALTH SERVICES  Per UR Regulation:    If discussed at Long Length of Stay Meetings, dates discussed:    Comments:  10/29/14 1100 Georgianne Fickola Iram Lundberg, RN, BSN, CCM Someone from [facility] will contact you regarding your home health PT needs.

## 2014-10-29 NOTE — Progress Notes (Signed)
Physical Therapy Evaluation Patient Details Name: [name] [name] MRN: [id number] DOB: 1951/01/18 Today's Date: 10/29/2014   History of Present Illness  63 yo male post direct anterior L total hip replacement due to AVN and collapse of femoral head. Surgery 10/28/14.    Clinical Impression  Patient with acute pain and weakness resulting from surgical procedure on 10/28/14.  Education provided for Anterior hip precautions and WBAT.  Mild physical assist for bed exercises and bed mobility only, otherwise SBA for transfers and mobility including stairs (cues for stair technique with crutches).  Able to ambulate limited community distances with crutches or 2 wheeled walker (150 feet). Patient will remain on skilled PT services while in hospital and recommend Home Health follow up for transition back home when medically appropriate for discharge.    Follow Up Recommendations Home health PT    Equipment Recommendations  None recommended by PT (Has equipment already.)    Recommendations for Other Services       Precautions / Restrictions Precautions Precautions: Anterior Hip (Calling physician for clarification 10/29/14) Precaution Booklet Issued: Yes (comment) Restrictions Weight Bearing Restrictions: No      Mobility  Bed Mobility Overal bed mobility: Needs Assistance Bed Mobility: Supine to Sit;Sit to Supine     Supine to sit: Supervision Sit to supine: Min guard   General bed mobility comments: limited by pain  Transfers   Equipment used: Crutches;Rolling walker (2 wheeled) Transfers: Sit to/from Stand Sit to Stand: Supervision         General transfer comment: Decreased due to pain.  Ambulation/Gait Ambulation/Gait assistance: Supervision Ambulation Distance (Feet): 125 Feet Assistive device: Rolling walker (2 wheeled);Crutches Gait Pattern/deviations: Decreased stride length;Antalgic   Gait velocity interpretation: Below normal speed for age/gender     Stairs Stairs: Yes Stairs assistance: Min guard Stair Management: No rails;One rail Right;Step to pattern;With crutches Number of Stairs: 4 General stair comments: Verbal cues and demonstration for technique.  Wheelchair Mobility    Modified Rankin (Stroke Patients Only)       Balance Overall balance assessment: Modified Independent                                           Pertinent Vitals/Pain Pain Assessment: 0-10 Pain Score: 8  Pain Location: L hip Pain Descriptors / Indicators: Aching;Dull (With activity) Pain Intervention(s): Limited activity within patient's tolerance;Premedicated before session;Relaxation    Home Living Family/patient expects to be discharged to:: Private residence Living Arrangements: Alone Available Help at Discharge: Family;Friend(s);Available PRN/intermittently Type of Home: House Home Access: Stairs to enter Entrance Stairs-Rails: Right Entrance Stairs-Number of Steps: 1 Home Layout: One level Home Equipment: Walker - 2 wheels;Crutches      Prior Function Level of Independence: Independent         Comments: has been using walker and crutches x 2 months due to pain.     Hand Dominance        Extremity/Trunk Assessment   Upper Extremity Assessment: Overall WFL for tasks assessed           Lower Extremity Assessment: Overall WFL for tasks assessed      Cervical / Trunk Assessment: Normal  Communication   Communication: No difficulties  Cognition Arousal/Alertness: Awake/alert Behavior During Therapy: WFL for tasks assessed/performed Overall Cognitive Status: Within Functional Limits for tasks assessed  General Comments      Exercises Total Joint Exercises Ankle Circles/Pumps: AROM;Supine Heel Slides: AAROM;Both;10 reps;Supine Hip ABduction/ADduction: AAROM;Both;10 reps;Supine      Assessment/Plan    PT Assessment Patient needs continued PT services;All  further PT needs can be met in the next venue of care  PT Diagnosis Difficulty walking;Acute pain   PT Problem List Decreased strength;Decreased mobility;Pain  PT Treatment Interventions DME instruction;Gait training;Stair training;Functional mobility training;Therapeutic activities;Therapeutic exercise;Patient/family education   PT Goals (Current goals can be found in the Care Plan section) Acute Rehab PT Goals Patient Stated Goal: To go home, get back to work next week. PT Goal Formulation: With patient Time For Goal Achievement: 11/08/14 Potential to Achieve Goals: Good    Frequency 7X/week   Barriers to discharge Decreased caregiver support Lives alone, son to assist first few days.    Co-evaluation               End of Session Equipment Utilized During Treatment: Gait belt Activity Tolerance: Patient tolerated treatment well Patient left: in bed;with call bell/phone within reach Nurse Communication: Mobility status;Precautions         Time: 0910-1000 PT Time Calculation (min) (ACUTE ONLY): 50 min   Charges:   PT Evaluation $Initial PT Evaluation Tier I: 1 Procedure PT Treatments $Gait Training: 8-22 mins $Therapeutic Activity: 8-22 mins   PT G Codes:          Chauntay Paszkiewicz L 2014-11-28, 10:23 AM

## 2014-10-29 NOTE — Progress Notes (Signed)
PT addendum note:  Verbal clarification received from Dr. Roda ShuttersXu.  NO hip precautions. 10/29/2014

## 2014-10-30 NOTE — Discharge Summary (Signed)
Physician Discharge Summary      Patient ID: [name] [name] MRN: [id number] DOB/AGE: 62/03/1951 62 y.o.  Admit date: 10/28/2014 Discharge date: 10/30/2014  Admission Diagnoses:  <principal problem not specified>  Discharge Diagnoses:  Active Problems:   Avascular necrosis of left femoral head   Past Medical History  Diagnosis Date  . Hypertension   . Hyperlipidemia   . GERD (gastroesophageal reflux disease)   . Vitamin D deficiency   . Arthritis     right thumb  . Dry eyes     Takes Restasis    Surgeries: Procedure(s): TOTAL HIP ARTHROPLASTY ANTERIOR APPROACH on 10/28/2014   Consultants (if any):    Discharged Condition: Improved  Hospital Course: [name] [name] is an 63 y.o. male who was admitted 10/28/2014 with a diagnosis of <principal problem not specified> and went to the operating room on 10/28/2014 and underwent the above named procedures.    He was given perioperative antibiotics:      Anti-infectives    Start     Dose/Rate Route Frequency Ordered Stop   10/28/14 2200  valACYclovir (VALTREX) tablet 500 mg  Status:  Discontinued     500 mg Oral 2 times daily 10/28/14 1456 10/29/14 1625   10/28/14 0719  ceFAZolin (ANCEF) 2-3 GM-% IVPB SOLR    Comments:  Marrianne Moodltman, Deborah   : cabinet override      10/28/14 0719 10/28/14 1929   10/28/14 0600  ceFAZolin (ANCEF) IVPB 2 g/50 mL premix     2 g100 mL/hr over 30 Minutes Intravenous On call to O.R. 10/27/14 1417 10/28/14 0935    .  He was given sequential compression devices, early ambulation, and aspirin for DVT prophylaxis.  He benefited maximally from the hospital stay and there were no complications.    Recent vital signs:  Filed Vitals:   10/29/14 1100  BP: 109/70  Pulse: 63  Temp:   Resp:     Recent laboratory studies:  Lab Results  Component Value Date   HGB 10.4* 10/29/2014   HGB 14.7 10/21/2014   HGB 15.0 09/13/2014   Lab Results  Component Value Date   WBC 6.0 10/29/2014   PLT  188 10/29/2014   Lab Results  Component Value Date   INR 1.04 10/21/2014   Lab Results  Component Value Date   NA 138 10/29/2014   K 4.1 10/29/2014   CL 99 10/29/2014   CO2 28 10/29/2014   BUN 19 10/29/2014   CREATININE 1.05 10/29/2014   GLUCOSE 112* 10/29/2014    Discharge Medications:     Medication List    TAKE these medications        aspirin EC 325 MG tablet  Take 1 tablet (325 mg total) by mouth daily.     atenolol 100 MG tablet  Commonly known as:  TENORMIN  Take 100 mg by mouth daily.     atenolol 100 MG tablet  Commonly known as:  TENORMIN  TAKE 1 BY MOUTH DAILY     celecoxib 200 MG capsule  Commonly known as:  CELEBREX  Take 1 capsule (200 mg total) by mouth daily.     ciprofloxacin 500 MG tablet  Commonly known as:  CIPRO  Take 1 tablet (500 mg total) by mouth 2 (two) times daily.     esomeprazole 40 MG capsule  Commonly known as:  NEXIUM  Take 1 capsule (40 mg total) by mouth daily at 12 noon.     finasteride 5 MG tablet  Commonly known as:  PROSCAR  Take 1 tablet (5 mg total) by mouth daily.     FISH OIL PO  Take 1,200 mg by mouth daily.     HYDROcodone-acetaminophen 5-325 MG per tablet  Commonly known as:  NORCO  Take 1/2 to 1 or 2 tabs every 4 hours if needed for pain     HYDROcodone-acetaminophen 5-325 MG per tablet  Commonly known as:  NORCO  1/2 to 1 tablet every 3 to 4 hours as needed for pain     hyoscyamine 0.125 MG tablet  Commonly known as:  LEVSIN  Take 1-2 tablets 4 x day or every 4 hours if needed for nausea, cramping or diarrhea     lisinopril-hydrochlorothiazide 20-25 MG per tablet  Commonly known as:  PRINZIDE,ZESTORETIC  Take 1 tablet by mouth daily.     lisinopril-hydrochlorothiazide 20-25 MG per tablet  Commonly known as:  PRINZIDE,ZESTORETIC  TAKE 1 BY MOUTH DAILY     methocarbamol 500 MG tablet  Commonly known as:  ROBAXIN  Take 1 tablet (500 mg total) by mouth every 6 (six) hours as needed for muscle spasms  (spasm).     metroNIDAZOLE 500 MG tablet  Commonly known as:  FLAGYL  Take 1 tablet (500 mg total) by mouth 3 (three) times daily.     oxyCODONE-acetaminophen 5-325 MG per tablet  Commonly known as:  ROXICET  Take 1-2 tablets by mouth every 4 (four) hours as needed.     pravastatin 40 MG tablet  Commonly known as:  PRAVACHOL  TAKE 1 BY MOUTH AT BEDTIME FOR CHOLESTEROL     RESTASIS OP  Place 0.5 drops into both eyes 2 (two) times daily.     TURMERIC PO  Take 1,500 mg by mouth daily.     valACYclovir 500 MG tablet  Commonly known as:  VALTREX  Take 500 mg by mouth 2 (two) times daily.     VITAMIN D PO  Take 5,000 Units by mouth daily.     zolpidem 5 MG tablet  Commonly known as:  AMBIEN  Take 1 tablet (5 mg total) by mouth at bedtime as needed for sleep.        Diagnostic Studies: Dg Chest 2 View  10/21/2014   CLINICAL DATA:  Preoperative for total hip arthroplasty  EXAM: CHEST  2 VIEW  COMPARISON:  PA and lateral chest of February 24, 2008  FINDINGS: The lungs are adequately inflated and clear. The heart and pulmonary vascularity are normal. The mediastinum is normal in width. There is no pleural effusion. The bony thorax is unremarkable.  IMPRESSION: There is no active cardiopulmonary disease.   Electronically Signed   By: David  Swazil[place]   On: 10/21/2014 11:12   Dg Hip Operative Left  10/28/2014   CLINICAL DATA:  Left hip arthroplasty.  EXAM: OPERATIVE LEFT HIP  COMPARISON:  August 17, 2014.  FINDINGS: Two intraoperative fluoroscopic images of the left hip demonstrate the femoral and acetabular components to be well situated. No dislocation is noted.  IMPRESSION: Status post left total hip arthroplasty.   Electronically Signed   By: Roque LiasJames  Green M.D.   On: 10/28/2014 12:00   Mr Hip Left Wo Contrast  10/17/2014   CLINICAL DATA:  Progressive severe left hip pain for 2 months with limited weight-bearing. No acute injury or prior relevant surgery. Initial encounter.  EXAM: MR OF  THE LEFT HIP WITHOUT CONTRAST  TECHNIQUE: Multiplanar, multisequence MR imaging was performed. No intravenous contrast was administered.  COMPARISON:  Radiographs 08/17/2014.  FINDINGS: Bones: There is left femoral head avascular necrosis with mild subchondral collapse and marrow edema throughout the femoral head and neck. The right femoral head appears normal. There is no evidence of acute fracture. The visualized bony pelvis appears normal. The sacroiliac joints and symphysis pubis appear normal. Disc degeneration and facet hypertrophy are noted within the lower lumbar spine.  Articular cartilage and labrum  Articular cartilage: There are degenerative changes of both hips, worse on the left. Subchondral cyst formation is present in both acetabula.  Labrum: Both acetabular labra are moderately degenerated.  Joint or bursal effusion  Joint effusion: There is a moderate size left hip joint effusion. No significant joint effusion is present on the right.  Bursae: No focal periarticular fluid collection.  Muscles and tendons  Muscles and tendons: The gluteus, hamstring and iliopsoas tendons appear normal. There is minimal cyst formation within the left ischial tuberosity adjacent to the hamstring origin. The piriformis muscles appear symmetric.  Other findings  Miscellaneous: The visualized internal pelvic contents appear unremarkable.  IMPRESSION: 1. Left femoral head avascular necrosis with early subchondral collapse and asymmetric left hip osteoarthritis. There is an associated moderate-sized joint effusion and marrow edema within the left femoral head and neck. 2. Lesser degenerative changes of the right hip without underlying AVN. 3. Both acetabular labra are degenerated.   Electronically Signed   By: Roxy Horseman M.D.   On: 10/17/2014 14:36    Disposition: 01-Home or Self Care  Discharge Instructions    Anterior total hip precautions    Complete by:  As directed      Call MD / Call 911    Complete by:  As  directed   If you experience chest pain or shortness of breath, CALL 911 and be transported to the hospital emergency room.  If you develope a fever above 101.5 F, pus (white drainage) or increased drainage or redness at the wound, or calf pain, call your surgeon's office.     Constipation Prevention    Complete by:  As directed   Drink plenty of fluids.  Prune juice may be helpful.  You may use a stool softener, such as Colace (over the counter) 100 mg twice a day.  Use MiraLax (over the counter) for constipation as needed.     Diet - low sodium heart healthy    Complete by:  As directed      Diet general    Complete by:  As directed      Driving restrictions    Complete by:  As directed   No driving while taking narcotic pain meds.     Follow the hip precautions as taught in Physical Therapy    Complete by:  As directed      Full weight bearing    Complete by:  As directed   Laterality:  left  Extremity:  Lower     Increase activity slowly as tolerated    Complete by:  As directed      TED hose    Complete by:  As directed   Use stockings (TED hose) for 6 weeks on both leg(s).  You may remove them at night for sleeping.           Follow-up Information    Follow up with Eldred Manges, MD. Schedule an appointment as soon as possible for a visit in 2 weeks.   Specialty:  Orthopedic Surgery   Contact information:   [address] [place] [address] [phone]       Follow up with [facility].   Why:  Someone from [facility] will contact you with your home health PT needs.   Contact information:   [address] [place] [address] [phone]        Signed: Cheral Almas 10/30/2014, 12:06 PM

## 2014-11-01 ENCOUNTER — Encounter (HOSPITAL_COMMUNITY): Payer: Self-pay | Admitting: Orthopaedic Surgery

## 2014-12-12 ENCOUNTER — Encounter: Payer: Self-pay | Admitting: Internal Medicine

## 2014-12-12 ENCOUNTER — Ambulatory Visit (INDEPENDENT_AMBULATORY_CARE_PROVIDER_SITE_OTHER): Payer: BLUE CROSS/BLUE SHIELD | Admitting: Internal Medicine

## 2014-12-12 VITALS — BP 110/66 | HR 60 | Temp 97.9°F | Resp 16 | Ht 67.75 in | Wt 155.6 lb

## 2014-12-12 DIAGNOSIS — M7918 Myalgia, other site: Secondary | ICD-10-CM

## 2014-12-12 DIAGNOSIS — M791 Myalgia: Secondary | ICD-10-CM

## 2014-12-12 LAB — CK: Total CK: 24 U/L (ref 7–232)

## 2014-12-12 MED ORDER — PREDNISONE 20 MG PO TABS: ORAL_TABLET | ORAL | Status: DC

## 2014-12-12 MED ORDER — LORAZEPAM 1 MG PO TABS: ORAL_TABLET | ORAL | Status: DC

## 2014-12-12 NOTE — Progress Notes (Signed)
Subjective:    Patient ID: [name] [name], male    DOB: 26-Dec-1950, 64 y.o.   MRN: [id number]  HPI  Patient is recently s/p Lt Hip THR for aseptic necrosis by Dr Ophelia Charter on Oct 28, 2014 and now relates ~ 2 -3 week hx of am stiffness and aching in the shoulder/pelvic limb girdle musculature which improves throughout the day. Says Dr Ophelia Charter recently did labs to r/o "arthritis". States the muscular aching and stiffness is fairly limiting. Denies jt swelling, tenderness, rash or other constitutional sx's.  Medication Sig  . aspirin EC 325 MG tablet Take 1 tablet (325 mg total) by mouth daily.  Marland Kitchen atenolol (TENORMIN) 100 MG tablet TAKE 1 BY MOUTH DAILY  . atenolol (TENORMIN) 100 MG tablet Take 100 mg by mouth daily.  . celecoxib (CELEBREX) 200 MG capsule Take 1 capsule (200 mg total) by mouth daily.  . Cholecalciferol (VITAMIN D PO) Take 5,000 Units by mouth daily.   . CycloSPORINE (RESTASIS OP) Place 0.5 drops into both eyes 2 (two) times daily.   Marland Kitchen esomeprazole (NEXIUM) 40 MG capsule Take 1 capsule (40 mg total) by mouth daily at 12 noon. (Patient taking differently: Take 40 mg by mouth every other day. )  . finasteride (PROSCAR) 5 MG tablet Take 1 tablet (5 mg total) by mouth daily. (Patient taking differently: Take 1 mg by mouth daily. )  . lisinopril-hydrochlorothiazide (PRINZIDE,ZESTORETIC) 20-25 MG per tablet TAKE 1 BY MOUTH DAILY  . methocarbamol (ROBAXIN) 500 MG tablet Take 1 tablet (500 mg total) by mouth every 6 (six) hours as needed for muscle spasms (spasm).  . Omega-3 Fatty Acids (FISH OIL PO) Take 1,200 mg by mouth daily.   . pravastatin (PRAVACHOL) 40 MG tablet TAKE 1 BY MOUTH AT BEDTIME FOR CHOLESTEROL  . TURMERIC PO Take 1,500 mg by mouth daily.  . valACYclovir (VALTREX) 500 MG tablet Take 500 mg by mouth 2 (two) times daily.  Marland Kitchen zolpidem (AMBIEN) 5 MG tablet Take 1 tablet (5 mg total) by mouth at bedtime as needed for sleep.  . ciprofloxacin (CIPRO) 500 MG tablet Take 1 tablet  (500 mg total) by mouth 2 (two) times daily. (Patient not taking: Reported on 10/19/2014)  . HYDROcodone-acetaminophen (NORCO) 5-325 MG per tablet Take 1/2 to 1 or 2 tabs every 4 hours if needed for pain (Patient taking differently: Take 1 tablet by mouth every 8 (eight) hours as needed for moderate pain. )  . HYDROcodone-acetaminophen (NORCO) 5-325 MG per tablet 1/2 to 1 tablet every 3 to 4 hours as needed for pain (Patient not taking: Reported on 10/19/2014)  . hyoscyamine (LEVSIN) 0.125 MG tablet Take 1-2 tablets 4 x day or every 4 hours if needed for nausea, cramping or diarrhea (Patient not taking: Reported on 10/19/2014)  . lisinopril-hydrochlorothiazide (PRINZIDE,ZESTORETIC) 20-25 MG per tablet Take 1 tablet by mouth daily.  . metroNIDAZOLE (FLAGYL) 500 MG tablet Take 1 tablet (500 mg total) by mouth 3 (three) times daily. (Patient not taking: Reported on 10/19/2014)  . oxyCODONE-acetaminophen (ROXICET) 5-325 MG per tablet Take 1-2 tablets by mouth every 4 (four) hours as needed.   No facility-administered medications prior to visit.   No Known Allergies Past Medical History  Diagnosis Date  . Hypertension   . Hyperlipidemia   . GERD (gastroesophageal reflux disease)   . Vitamin D deficiency   . Arthritis     right thumb  . Dry eyes     Takes Restasis   Past Surgical History  Procedure Laterality Date  . Esophagogastroduodenoscopy  1990  . Colonoscopy w/ polypectomy    . Total hip arthroplasty Left 10/28/2014    Procedure: TOTAL HIP ARTHROPLASTY ANTERIOR APPROACH;  Surgeon: Eldred Manges, MD;  Location: [facility] OR;  Service: Orthopedics;  Laterality: Left;   Review of Systems  In addition to the HPI above,  No Fever-chills,  No Headache, No changes with Vision or hearing,  No problems swallowing food or Liquids,  No Chest pain or productive Cough or Shortness of Breath,  No Abdominal pain, No Nausea or Vomitting, Bowel movements are regular,  No Blood in stool or Urine,  No  dysuria,  No new skin rashes or bruises,  No new weakness, tingling, numbness in any extremity,  No recent weight loss,  No polyuria, polydypsia or polyphagia,  No significant Mental Stressors.  A full 10 point Review of Systems was done, except as stated above, all other Review of Systems were negative     Objective:   Physical Exam  BP 110/66 mmHg  Pulse 60  Temp(Src) 97.9 F (36.6 C)  Resp 16  Ht 5' 7.75" (1.721 m)  Wt 155 lb 9.6 oz (70.58 kg)  BMI 23.83 kg/m2  HEENT - Eac's patent. TM's Nl. EOM's full. PERRLA. NasoOroPharynx clear. Neck - supple. Nl Thyroid. Carotids 2+ & No bruits, nodes, JVD Chest - Clear equal BS w/o Rales, rhonchi, wheezes. Cor - Nl HS. RRR w/o sig MGR. PP 1(+). No edema. Abd - No palpable organomegaly, masses or tenderness. BS nl. MS- FROM w/o deformities. Muscle power, tone and bulk Nl. Gait Nl. Neuro - No obvious Cr N abnormalities. Sensory, motor and Cerebellar functions appear Nl w/o focal abnormalities. Psyche - Mental status normal & appropriate.  No delusions, ideations or obvious mood abnormalities.    Assessment & Plan:   1. Muscle ache of extremity  - Aldolase - CK - Sedimentation rate  - Rx prednisone pulse/taper as empiric & therapeutic trial pending labs.

## 2014-12-12 NOTE — Patient Instructions (Signed)
Stop Pravastatin  ++++++++++++++++++++++++++++++++++++++++   Muscle Pain Muscle pain (myalgia) may be caused by many things, including:  Overuse or muscle strain, especially if you are not in shape. This is the most common cause of muscle pain.  Injury.  Bruises.  Viruses, such as the flu.  Infectious diseases.  Fibromyalgia, which is a chronic condition that causes muscle tenderness, fatigue, and headache.  Autoimmune diseases, including lupus.  Certain drugs, including ACE inhibitors and statins. Muscle pain may be mild or severe. In most cases, the pain lasts only a short time and goes away without treatment. To diagnose the cause of your muscle pain, your health care provider will take your medical history. This means he or she will ask you when your muscle pain began and what has been happening. If you have not had muscle pain for very long, your health care provider may want to wait before doing much testing. If your muscle pain has lasted a long time, your health care provider may want to run tests right away. If your health care provider thinks your muscle pain may be caused by illness, you may need to have additional tests to rule out certain conditions.  Treatment for muscle pain depends on the cause. Home care is often enough to relieve muscle pain. Your health care provider may also prescribe anti-inflammatory medicine. HOME CARE INSTRUCTIONS Watch your condition for any changes. The following actions may help to lessen any discomfort you are feeling:  Only take over-the-counter or prescription medicines as directed by your health care provider.  Apply ice to the sore muscle:  Put ice in a plastic bag.  Place a towel between your skin and the bag.  Leave the ice on for 15-20 minutes, 3-4 times a day.  You may alternate applying hot and cold packs to the muscle as directed by your health care provider.  If overuse is causing your muscle pain, slow down your  activities until the pain goes away.  Remember that it is normal to feel some muscle pain after starting a workout program. Muscles that have not been used often will be sore at first.  Do regular, gentle exercises if you are not usually active.  Warm up before exercising to lower your risk of muscle pain.  Do not continue working out if the pain is very bad. Bad pain could mean you have injured a muscle. SEEK MEDICAL CARE IF:  Your muscle pain gets worse, and medicines do not help.  You have muscle pain that lasts longer than 3 days.  You have a rash or fever along with muscle pain.  You have muscle pain after a tick bite.  You have muscle pain while working out, even though you are in good physical condition.  You have redness, soreness, or swelling along with muscle pain.  You have muscle pain after starting a new medicine or changing the dose of a medicine. SEEK IMMEDIATE MEDICAL CARE IF:  You have trouble breathing.  You have trouble swallowing.  You have muscle pain along with a stiff neck, fever, and vomiting.  You have severe muscle weakness or cannot move part of your body. MAKE SURE YOU:   Understand these instructions.  Will watch your condition.  Will get help right away if you are not doing well or get worse. Document Released: 10/17/2006 Document Revised: 11/30/2013 Document Reviewed: 09/21/2013 [facility] Patient Information 2015 [place], [place]. This information is not intended to replace advice given to you by your health care  provider. Make sure you discuss any questions you have with your health care provider.

## 2014-12-13 LAB — SEDIMENTATION RATE: Sed Rate: 30 mm/hr — ABNORMAL HIGH (ref 0–16)

## 2014-12-14 LAB — ALDOLASE: Aldolase: 5 U/L (ref <=8.1)

## 2014-12-19 ENCOUNTER — Encounter: Payer: Self-pay | Admitting: Physician Assistant

## 2014-12-19 ENCOUNTER — Ambulatory Visit (INDEPENDENT_AMBULATORY_CARE_PROVIDER_SITE_OTHER): Payer: BLUE CROSS/BLUE SHIELD | Admitting: Physician Assistant

## 2014-12-19 VITALS — BP 118/60 | HR 62 | Temp 98.2°F | Resp 18 | Ht 67.5 in | Wt 157.0 lb

## 2014-12-19 DIAGNOSIS — E785 Hyperlipidemia, unspecified: Secondary | ICD-10-CM

## 2014-12-19 DIAGNOSIS — E559 Vitamin D deficiency, unspecified: Secondary | ICD-10-CM

## 2014-12-19 DIAGNOSIS — G4700 Insomnia, unspecified: Secondary | ICD-10-CM

## 2014-12-19 DIAGNOSIS — M791 Myalgia, unspecified site: Secondary | ICD-10-CM

## 2014-12-19 DIAGNOSIS — R7309 Other abnormal glucose: Secondary | ICD-10-CM

## 2014-12-19 DIAGNOSIS — I10 Essential (primary) hypertension: Secondary | ICD-10-CM

## 2014-12-19 DIAGNOSIS — Z79899 Other long term (current) drug therapy: Secondary | ICD-10-CM

## 2014-12-19 MED ORDER — ZALEPLON 10 MG PO CAPS: 10.0000 mg | ORAL_CAPSULE | Freq: Every evening | ORAL | Status: DC | PRN

## 2014-12-19 NOTE — Progress Notes (Signed)
Assessment and Plan:  1. Essential hypertension Continue Atenolol and Prinzide as prescribed.  Monitor blood pressure at home.  Reminder to go to the ER if any CP, SOB, nausea, dizziness, severe HA, changes vision/speech, left arm numbness and tingling, and jaw pain.  Check CBCw/Diff, BMP, HFP in February 2016.  2. Hyperlipidemia Continue Fish Oil as prescribed.  Please follow recommended diet and exercise.  Check cholesterol in February 2016.  3. Blood glucose abnormal Please follow recommended diet and exercise.  Check A1C and Insulin levels in February 2016.  4. Vitamin D deficiency Continue vitamin D as prescribed.  Check vitamin D level in February 2016.  5. Encounter for long-term (current) use of medications Will monitor kidney and liver function.    6. Insomnia Please start taking Sonata as prescribed.  Please go to bed at same time every night.  7. Myalgia- Shoulder pain Ordered Magnesium level.  Continue diet and meds as discussed. Further disposition pending results of labs. Discussed medication effects and SE's.  Pt agreed to treatment plan. Please follow up in 1 month for labs and medication recheck.  HPI A Caucasian 64 y.o. male presents for 3 month follow up with hypertension, hyperlipidemia, abnormal glucose and vitamin D.  Last visit was 09/13/14.  Patient refuses labs drawn today.  Since stopping the Pravastatin, he wants to get all his labs done in 1 month (February 2016) for fasting blood work.  He is scarred of needles and just had blood work done in November in hospital and more labs for shoulder pain in January 2016.    His blood pressure has been controlled at home, today their BP is BP: 118/60 mmHg.  Patient is on Atenolol  and Prinzide 20-25mg .  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 Ophelia Charter -November 2015). He denies chest pain, shortness of breath, dizziness.   He is on cholesterol medication (Fish  Oil) and denies myalgias.  Stopped taking Pravastatin due to shoulder pain.  His cholesterol is not at goal. The cholesterol last visit was:   Lab Results  Component Value Date   CHOL 138 09/13/2014   HDL 39* 09/13/2014   LDLCALC 72 09/13/2014   TRIG 135 09/13/2014   CHOLHDL 3.5 09/13/2014   He has been working on diet and exercise for family history of diabetes/abnormal glucose, and denies increased appetite, polydipsia and polyuria. Last A1C in the office was:  Lab Results  Component Value Date   HGBA1C 5.3 09/13/2014  Number of meals per day? 3  B- orange juice, cereal, yogurt, apple  L- left overs, sandwich, soup  D- chicken, rice  Snacks? Granola bar, hard candy  Beverages? Water and half coke zero.  1 cup of coffee in morning  Patient is on Vitamin D supplement. - 5,000 IU Daily  Lab Results  Component Value Date   VD25OH 58* 09/13/2014     Shoulder Pain- Patient states his shoulder are feeling better since stopping the Pravastatin.  He reports some mild achiness .  He thought is was from stopping the Oxycodone and having withdrawal sxs/arm pain.  He states he could not get out of bed in the beginning.  Insomnia- Patient states he does not go to bed at same time every day.  He states the Ativan does not help for sleep.  He states he took [place] before and that helped a lot.  Patient states he did not have trouble sleeping before the hip surgery.  It  all started after surgery.  He states he does not watch TV before bed and makes sure the room is dark to fall asleep.  He states he has difficulty falling and staying asleep right now.  Current Medications:  Current Outpatient Prescriptions on File Prior to Visit  Medication Sig Dispense Refill  . aspirin EC 325 MG tablet Take 1 tablet (325 mg total) by mouth daily. 30 tablet 0  . atenolol (TENORMIN) 100 MG tablet TAKE 1 BY MOUTH DAILY 90 tablet 0  . celecoxib (CELEBREX) 200 MG capsule Take 1 capsule (200 mg total) by mouth daily.  90 capsule 3  . Cholecalciferol (VITAMIN D PO) Take 5,000 Units by mouth daily.     . CycloSPORINE (RESTASIS OP) Place 0.5 drops into both eyes 2 (two) times daily.     Marland Kitchen. esomeprazole (NEXIUM) 40 MG capsule Take 1 capsule (40 mg total) by mouth daily at 12 noon. (Patient taking differently: Take 40 mg by mouth every other day. ) 90 capsule 3  . finasteride (PROSCAR) 5 MG tablet Take 1 tablet (5 mg total) by mouth daily. (Patient taking differently: Take 1 mg by mouth daily. ) 90 tablet 1  . hyoscyamine (LEVSIN SL) 0.125 MG SL tablet   0  . lisinopril-hydrochlorothiazide (PRINZIDE,ZESTORETIC) 20-25 MG per tablet TAKE 1 BY MOUTH DAILY 90 tablet 0  . LORazepam (ATIVAN) 1 MG tablet Take 1/2 to 1 or 2 tablets as needed for sleep 60 tablet 3  . methocarbamol (ROBAXIN) 500 MG tablet Take 1 tablet (500 mg total) by mouth every 6 (six) hours as needed for muscle spasms (spasm). 30 tablet 0  . Omega-3 Fatty Acids (FISH OIL PO) Take 1,200 mg by mouth daily.     . TURMERIC PO Take 1,500 mg by mouth daily.    . valACYclovir (VALTREX) 500 MG tablet Take 500 mg by mouth 2 (two) times daily.     No current facility-administered medications on file prior to visit.   Medical History:  Past Medical History  Diagnosis Date  . Hypertension   . Hyperlipidemia   . GERD (gastroesophageal reflux disease)   . Vitamin D deficiency   . Arthritis     right thumb  . Dry eyes     Takes Restasis   Allergies: No Known Allergies   ROS- Review of Systems  Constitutional: Negative.   HENT: Negative.   Eyes: Negative.   Respiratory: Negative.   Cardiovascular: Negative.   Gastrointestinal: Negative.   Genitourinary: Negative.   Musculoskeletal: Positive for joint pain.       Bilateral shoulder pain has improved, but still little achy.  Skin: Negative.   Neurological: Negative.   Psychiatric/Behavioral: Negative.    Family history- Review and unchanged Social history- Review and unchanged  Physical Exam: BP  118/60 mmHg  Pulse 62  Temp(Src) 98.2 F (36.8 C) (Temporal)  Resp 18  Ht 5' 7.5" (1.715 m)  Wt 157 lb (71.215 kg)  BMI 24.21 kg/m2  SpO2 98% Wt Readings from Last 3 Encounters:  12/19/14 157 lb (71.215 kg)  12/12/14 155 lb 9.6 oz (70.58 kg)  10/28/14 154 lb (69.854 kg)  Vitals Reviewed. General Appearance: Well nourished, in no apparent distress. Eyes: PERRLA, EOMIs, conjunctiva no swelling or erythema.  No scleral icterus. Sinuses: No Frontal/maxillary tenderness ENT/Mouth: External auditory canals clear, TMs without erythema, edema or bulging. No erythema, edema, or exudate on posterior pharynx.  Tonsils not swollen or erythematous. Hearing normal.  Neck: Supple, thyroid normal.  Respiratory:  Respiratory effort normal, CTAB.  No w/r/r or stridor.  Cardio: RRR.  m/r/g. S1S2nl. Brisk peripheral pulses without edema.  Abdomen: Soft, + normal BS.  Non tender, no guarding, rebound, hernias, masses. Lymphatics: Non tender without lymphadenopathy.  Musculoskeletal: Full ROM, 5/5 strength, normal gait.   Skin: Warm, dry intact without rashes, lesions, ecchymosis.  Neuro: Cranial nerves intact. No cerebellar symptoms. Sensation intact.  Psych: Awake and oriented X 3, normal affect, Insight and Judgment appropriate.    Gussie Towson, Lise Auer, PA-C 3:49 PM [facility]

## 2014-12-19 NOTE — Patient Instructions (Addendum)
-Continue medications as prescribed.  Please start taking Sonata at bedtime.  Please take first dose at home to see if side effects.  Please get labs done in late February and then schedule follow up 2 days later.  Insomnia Insomnia is frequent trouble falling and/or staying asleep. Insomnia can be a long term problem or a short term problem. Both are common. Insomnia can be a short term problem when the wakefulness is related to a certain stress or worry. Long term insomnia is often related to ongoing stress during waking hours and/or poor sleeping habits. Overtime, sleep deprivation itself can make the problem worse. Every little thing feels more severe because you are overtired and your ability to cope is decreased. CAUSES   Stress, anxiety, and depression.  Poor sleeping habits.  Distractions such as TV in the bedroom.  Naps close to bedtime.  Engaging in emotionally charged conversations before bed.  Technical reading before sleep.  Alcohol and other sedatives. They may make the problem worse. They can hurt normal sleep patterns and normal dream activity.  Stimulants such as caffeine for several hours prior to bedtime.  Pain syndromes and shortness of breath can cause insomnia.  Exercise late at night.  Changing time zones may cause sleeping problems (jet lag). It is sometimes helpful to have someone observe your sleeping patterns. They should look for periods of not breathing during the night (sleep apnea). They should also look to see how long those periods last. If you live alone or observers are uncertain, you can also be observed at a sleep clinic where your sleep patterns will be professionally monitored. Sleep apnea requires a checkup and treatment. Give your caregivers your medical history. Give your caregivers observations your family has made about your sleep.  SYMPTOMS   Not feeling rested in the morning.  Anxiety and restlessness at bedtime.  Difficulty falling  and staying asleep. TREATMENT   Your caregiver may prescribe treatment for an underlying medical disorders. Your caregiver can give advice or help if you are using alcohol or other drugs for self-medication. Treatment of underlying problems will usually eliminate insomnia problems.  Medications can be prescribed for short time use. They are generally not recommended for lengthy use.  Over-the-counter sleep medicines are not recommended for lengthy use. They can be habit forming.  You can promote easier sleeping by making lifestyle changes such as:  Using relaxation techniques that help with breathing and reduce muscle tension.  Exercising earlier in the day.  Changing your diet and the time of your last meal. No night time snacks.  Establish a regular time to go to bed.  Counseling can help with stressful problems and worry.  Soothing music and white noise may be helpful if there are background noises you cannot remove.  Stop tedious detailed work at least one hour before bedtime. HOME CARE INSTRUCTIONS   Keep a diary. Inform your caregiver about your progress. This includes any medication side effects. See your caregiver regularly. Take note of:  Times when you are asleep.  Times when you are awake during the night.  The quality of your sleep.  How you feel the next day. This information will help your caregiver care for you.  Get out of bed if you are still awake after 15 minutes. Read or do some quiet activity. Keep the lights down. Wait until you feel sleepy and go back to bed.  Keep regular sleeping and waking hours. Avoid naps.  Exercise regularly.  Avoid distractions at  bedtime. Distractions include watching television or engaging in any intense or detailed activity like attempting to balance the household checkbook.  Develop a bedtime ritual. Keep a familiar routine of bathing, brushing your teeth, climbing into bed at the same time each night, listening to  soothing music. Routines increase the success of falling to sleep faster.  Use relaxation techniques. This can be using breathing and muscle tension release routines. It can also include visualizing peaceful scenes. You can also help control troubling or intruding thoughts by keeping your mind occupied with boring or repetitive thoughts like the old concept of counting sheep. You can make it more creative like imagining planting one beautiful flower after another in your backyard garden.  During your day, work to eliminate stress. When this is not possible use some of the previous suggestions to help reduce the anxiety that accompanies stressful situations. MAKE SURE YOU:   Understand these instructions.  Will watch your condition.  Will get help right away if you are not doing well or get worse. Document Released: 11/22/2000 Document Revised: 02/17/2012 Document Reviewed: 12/23/2007 [facility] Patient Information 2015 [place], [place]. This information is not intended to replace advice given to you by your health care provider. Make sure you discuss any questions you have with your health care provider.

## 2015-01-20 ENCOUNTER — Other Ambulatory Visit: Payer: Self-pay | Admitting: Internal Medicine

## 2015-01-20 ENCOUNTER — Other Ambulatory Visit: Payer: BLUE CROSS/BLUE SHIELD

## 2015-01-20 DIAGNOSIS — E559 Vitamin D deficiency, unspecified: Secondary | ICD-10-CM

## 2015-01-20 DIAGNOSIS — I10 Essential (primary) hypertension: Secondary | ICD-10-CM

## 2015-01-20 DIAGNOSIS — R7309 Other abnormal glucose: Secondary | ICD-10-CM

## 2015-01-20 DIAGNOSIS — Z79899 Other long term (current) drug therapy: Secondary | ICD-10-CM

## 2015-01-20 DIAGNOSIS — E785 Hyperlipidemia, unspecified: Secondary | ICD-10-CM

## 2015-01-20 DIAGNOSIS — M791 Myalgia, unspecified site: Secondary | ICD-10-CM

## 2015-01-20 LAB — VITAMIN D 25 HYDROXY (VIT D DEFICIENCY, FRACTURES): Vit D, 25-Hydroxy: 70 ng/mL (ref 30–100)

## 2015-01-20 LAB — CBC WITH DIFFERENTIAL/PLATELET
Basophils Absolute: 0 10*3/uL (ref 0.0–0.1)
Basophils Relative: 0 % (ref 0–1)
Eosinophils Absolute: 0.2 10*3/uL (ref 0.0–0.7)
Eosinophils Relative: 2 % (ref 0–5)
HCT: 46.2 % (ref 39.0–52.0)
Hemoglobin: 15.9 g/dL (ref 13.0–17.0)
Lymphocytes Relative: 15 % (ref 12–46)
Lymphs Abs: 1.2 10*3/uL (ref 0.7–4.0)
MCH: 32.4 pg (ref 26.0–34.0)
MCHC: 34.4 g/dL (ref 30.0–36.0)
MCV: 94.3 fL (ref 78.0–100.0)
MPV: 9.6 fL (ref 8.6–12.4)
Monocytes Absolute: 0.5 10*3/uL (ref 0.1–1.0)
Monocytes Relative: 7 % (ref 3–12)
Neutro Abs: 5.9 10*3/uL (ref 1.7–7.7)
Neutrophils Relative %: 76 % (ref 43–77)
Platelets: 216 10*3/uL (ref 150–400)
RBC: 4.9 MIL/uL (ref 4.22–5.81)
RDW: 13 % (ref 11.5–15.5)
WBC: 7.8 10*3/uL (ref 4.0–10.5)

## 2015-01-20 LAB — BASIC METABOLIC PANEL WITH GFR
BUN: 22 mg/dL (ref 6–23)
CO2: 30 mEq/L (ref 19–32)
Calcium: 9.3 mg/dL (ref 8.4–10.5)
Chloride: 101 mEq/L (ref 96–112)
Creat: 1.04 mg/dL (ref 0.50–1.35)
GFR, Est African American: 89 mL/min
GFR, Est Non African American: 77 mL/min
Glucose, Bld: 108 mg/dL — ABNORMAL HIGH (ref 70–99)
Potassium: 4.3 mEq/L (ref 3.5–5.3)
Sodium: 137 mEq/L (ref 135–145)

## 2015-01-20 LAB — HEPATIC FUNCTION PANEL
ALT: 17 U/L (ref 0–53)
AST: 18 U/L (ref 0–37)
Albumin: 3.8 g/dL (ref 3.5–5.2)
Alkaline Phosphatase: 42 U/L (ref 39–117)
Bilirubin, Direct: 0.1 mg/dL (ref 0.0–0.3)
Indirect Bilirubin: 0.5 mg/dL (ref 0.2–1.2)
Total Bilirubin: 0.6 mg/dL (ref 0.2–1.2)
Total Protein: 6.3 g/dL (ref 6.0–8.3)

## 2015-01-20 LAB — LIPID PANEL
Cholesterol: 216 mg/dL — ABNORMAL HIGH (ref 0–200)
HDL: 55 mg/dL (ref >39)
LDL Cholesterol: 137 mg/dL — ABNORMAL HIGH (ref 0–99)
Total CHOL/HDL Ratio: 3.9 Ratio
Triglycerides: 119 mg/dL (ref <150)
VLDL: 24 mg/dL (ref 0–40)

## 2015-01-20 LAB — MAGNESIUM: Magnesium: 2.1 mg/dL (ref 1.5–2.5)

## 2015-01-21 LAB — HEMOGLOBIN A1C
Hgb A1c MFr Bld: 5.2 % (ref <5.7)
Mean Plasma Glucose: 103 mg/dL (ref <117)

## 2015-01-21 LAB — INSULIN, FASTING: Insulin fasting, serum: 22.1 u[IU]/mL — ABNORMAL HIGH (ref 2.0–19.6)

## 2015-01-25 ENCOUNTER — Encounter: Payer: Self-pay | Admitting: Physician Assistant

## 2015-01-25 ENCOUNTER — Ambulatory Visit (INDEPENDENT_AMBULATORY_CARE_PROVIDER_SITE_OTHER): Payer: BLUE CROSS/BLUE SHIELD | Admitting: Physician Assistant

## 2015-01-25 VITALS — BP 120/78 | HR 80 | Temp 97.9°F | Resp 16 | Ht 67.5 in | Wt 158.0 lb

## 2015-01-25 DIAGNOSIS — M545 Low back pain, unspecified: Secondary | ICD-10-CM

## 2015-01-25 DIAGNOSIS — M791 Myalgia, unspecified site: Secondary | ICD-10-CM

## 2015-01-25 DIAGNOSIS — L409 Psoriasis, unspecified: Secondary | ICD-10-CM

## 2015-01-25 DIAGNOSIS — M2550 Pain in unspecified joint: Secondary | ICD-10-CM

## 2015-01-25 NOTE — Patient Instructions (Addendum)
Benefiber is good for constipation/diarrhea/irritable bowel syndrome, it helps with weight loss and can help lower your bad cholesterol. Please do 1-2 TBSP in the morning in water, coffee, or tea. It can take up to a month before you can see a difference with your bowel movements. It is cheapest from [employer], [employer], [employer].   Your LDL is not in range, 01/20/2015: LDL (calc) 137*. Your LDL is the bad cholesterol that can lead to heart attack and stroke. To lower your number you can decrease your fatty foods, red meat, cheese, milk and increase fiber like whole grains and veggies. You can also add a fiber supplement like Metamucil or Benefiber.    Psoriasis Psoriasis is a common, long-lasting (chronic) inflammation of the skin. It affects both men and women equally, of all ages and all races. Psoriasis cannot be passed from person to person (not contagious). Psoriasis varies from mild to very severe. When severe, it can greatly affect your quality of life. Psoriasis is an inflammatory disorder affecting the skin as well as other organs including the joints (causing an arthritis). With psoriasis, the skin sheds its top layer of cells more rapidly than it does in someone without psoriasis. CAUSES  The cause of psoriasis is largely unknown. Genetics, your immune system, and the environment seem to play a role in causing psoriasis. Factors that can make psoriasis worse include:  Damage or trauma to the skin, such as cuts, scrapes, and sunburn. This damage often causes new areas of psoriasis (lesions).  Winter dryness and lack of sunlight.  Medicines such as lithium, beta-blockers, antimalarial drugs, ACE inhibitors, nonsteroidal anti-inflammatory drugs (ibuprofen, aspirin), and terbinafine. Let your caregiver know if you are taking any of these drugs.  Alcohol. Excessive alcohol use should be avoided if you have psoriasis. Drinking large amounts of alcohol can affect:  How well your psoriasis treatment  works.  How safe your psoriasis treatment is.  Smoking. If you smoke, ask your caregiver for help to quit.  Stress.  Bacterial or viral infections.  Arthritis. Arthritis associated with psoriasis (psoriatic arthritis) affects less than 10% of patients with psoriasis. The arthritic intensity does not always match the skin psoriasis intensity. It is important to let your caregiver know if your joints hurt or if they are stiff. SYMPTOMS  The most common form of psoriasis begins with little red bumps that gradually become larger. The bumps begin to form scales that flake off easily. The lower layers of scales stick together. When these scales are scratched or removed, the underlying skin is tender and bleeds easily. These areas then grow in size and may become large. Psoriasis often creates a rash that looks the same on both sides of the body (symmetrical). It often affects the elbows, knees, groin, genitals, arms, legs, scalp, and nails. Affected nails often have pitting, loosen, thicken, crumble, and are difficult to treat.  "Inverse psoriasis"occurs in the armpits, under breasts, in skin folds, and around the groin, buttocks, and genitals.  "Guttate psoriasis" generally occurs in children and young adults following a recent sore throat (strep throat). It begins with many small, red, scaly spots on the skin. It clears spontaneously in weeks or a few months without treatment. DIAGNOSIS  Psoriasis is diagnosed by physical exam. A tissue sample (biopsy) may also be taken. TREATMENT The treatment of psoriasis depends on your age, health, and living conditions.  Steroid (cortisone) creams, lotions, and ointments may be used. These treatments are associated with thinning of the skin, blood vessels  that get larger (dilated), loss of skin pigmentation, and easy bruising. It is important to use these steroids as directed by your caregiver. Only treat the affected areas and not the normal, unaffected  skin. People on long-term steroid treatment should wear a medical alert bracelet. Injections may be used in areas that are difficult to treat.  Scalp treatments are available as shampoos, solutions, sprays, foams, and oils. Avoid scratching the scalp and picking at the scales.  Anthralin medicine works well on areas that are difficult to treat. However, it stains clothes and skin and may cause temporary irritation.  Synthetic vitamin D (calcipotriene)can be used on small areas. It is available by prescription. The forms of synthetic vitamin D available in health food stores do not help with psoriasis.  Coal tarsare available in various strengths for psoriasis that is difficult to treat. They are one of the longest used treatments for difficult to treat psoriasis. However, they are messy to use.  Light therapy (UV therapy) can be carefully and professionally monitored in a dermatologist's office. Careful sunbathing is helpful for many people as directed by your caregiver. The exposure should be just long enough to cause a mild redness (erythema) of your skin. Avoid sunburn as this may make the condition worse. Sunscreen (SPF of 30 or higher) should be used to protect against sunburn. Cataracts, wrinkles, and skin aging are some of the harmful side effects of light therapy.  If creams (topical medicines) fail, there are several other options for systemic or oral medicines your caregiver can suggest. Psoriasis can sometimes be very difficult to treat. It can come and go. It is necessary to follow up with your caregiver regularly if your psoriasis is difficult to treat. Usually, with persistence you can get a good amount of relief. Maintaining consistent care is important. Do not change caregivers just because you do not see immediate results. It may take several trials to find the right combination of treatment for you. PREVENTING FLARE-UPS  Wear gloves while you wash dishes, while cleaning, and when  you are outside in the cold.  If you have radiators, place a bowl of water or damp towel on the radiator. This will help put water back in the air. You can also use a humidifier to keep the air moist. Try to keep the humidity at about 60% in your home.  Apply moisturizer while your skin is still damp from bathing or showering. This traps water in the skin.  Avoid long, hot baths or showers. Keep soap use to a minimum. Soaps dry out the skin and wash away the protective oils. Use a fragrance free, dye free soap.  Drink enough water and fluids to keep your urine clear or pale yellow. Not drinking enough water depletes your skin's water supply.  Turn off the heat at night and keep it low during the day. Cool air is less drying. SEEK MEDICAL CARE IF:  You have increasing pain in the affected areas.  You have uncontrolled bleeding in the affected areas.  You have increasing redness or warmth in the affected areas.  You start to have pain or stiffness in your joints.  You start feeling depressed about your condition.  You have a fever. Document Released: 11/22/2000 Document Revised: 02/17/2012 Document Reviewed: 05/20/2011 [facility] Patient Information 2015 [place], [place]. This information is not intended to replace advice given to you by your health care provider. Make sure you discuss any questions you have with your health care provider.

## 2015-01-25 NOTE — Progress Notes (Signed)
Assessment and Plan: 1. Myalgia Continue off statin since better, check chol at next OV  2. Bilateral low back pain without sciatica - HLA-B27 antigen  3. Psoriasis Appears to be psoriasis- will send to rheum for evaluation - Ambulatory referral to Rheumatology  4. Polyarthralgia - Anti-DNA antibody, double-stranded - ANA - Rheumatoid factor - Sedimentation rate - Uric acid - Ambulatory referral to Rheumatology    HPI 63 y.o.male presents for 1 month follow up. He was seen 12/19/2014 for 3 month OV, he has been off the pravastatin for 1-2 months. He had shoulder surgery 12/19/2014, he was having upper joint pain with some hand/muscle weakness after the procedure so Dr. Melford Aase told him to stop the statin and he felt immediately better the day or two after stopping it.  Lab Results  Component Value Date   CHOL 216* 01/20/2015   HDL 55 01/20/2015   LDLCALC 137* 01/20/2015   TRIG 119 01/20/2015   CHOLHDL 3.9 01/20/2015   He does have lower back pain, bilateral, worse with sitting for a long tim. He responds really well to prednisone, was on 62m, 1/2-1 a day. He is following with Dr. YLorin Mercy started back on prednisone and his back is better. His last CPK and aldolase were normal 12/12/2014. He had a slightly elevated sed rate 12/12/2014. He has dry eyes, has intermittent rash on elbows, back of leg, silvery plaques.   Past Medical History  Diagnosis Date  . Hypertension   . Hyperlipidemia   . GERD (gastroesophageal reflux disease)   . Vitamin D deficiency   . Arthritis     right thumb  . Dry eyes     Takes Restasis     No Known Allergies    Current Outpatient Prescriptions on File Prior to Visit  Medication Sig Dispense Refill  . aspirin EC 325 MG tablet Take 1 tablet (325 mg total) by mouth daily. 30 tablet 0  . atenolol (TENORMIN) 100 MG tablet TAKE 1 BY MOUTH DAILY 90 tablet 0  . celecoxib (CELEBREX) 200 MG capsule TAKE 1 BY MOUTH DAILY 90 capsule 0  .  Cholecalciferol (VITAMIN D PO) Take 5,000 Units by mouth daily.     . CycloSPORINE (RESTASIS OP) Place 0.5 drops into both eyes 2 (two) times daily.     .Marland Kitchenesomeprazole (NEXIUM) 40 MG capsule Take 1 capsule (40 mg total) by mouth daily at 12 noon. (Patient taking differently: Take 40 mg by mouth every other day. ) 90 capsule 3  . finasteride (PROSCAR) 5 MG tablet Take 1 tablet (5 mg total) by mouth daily. (Patient taking differently: Take 1 mg by mouth daily. ) 90 tablet 1  . hyoscyamine (LEVSIN SL) 0.125 MG SL tablet   0  . lisinopril-hydrochlorothiazide (PRINZIDE,ZESTORETIC) 20-25 MG per tablet TAKE 1 BY MOUTH DAILY 90 tablet 0  . methocarbamol (ROBAXIN) 500 MG tablet Take 1 tablet (500 mg total) by mouth every 6 (six) hours as needed for muscle spasms (spasm). 30 tablet 0  . Omega-3 Fatty Acids (FISH OIL PO) Take 1,200 mg by mouth daily.     . TURMERIC PO Take 1,500 mg by mouth daily.    . valACYclovir (VALTREX) 500 MG tablet Take 500 mg by mouth 2 (two) times daily.    . zaleplon (SONATA) 10 MG capsule Take 1 capsule (10 mg total) by mouth at bedtime as needed for sleep. 30 capsule 0   No current facility-administered medications on file prior to visit.    ROS: all  negative except above.   Physical Exam: Filed Weights   01/25/15 1539  Weight: 158 lb (71.668 kg)   BP 120/78 mmHg  Pulse 80  Temp(Src) 97.9 F (36.6 C)  Resp 16  Ht 5' 7.5" (1.715 m)  Wt 158 lb (71.668 kg)  BMI 24.37 kg/m2 General Appearance: Well nourished, in no apparent distress. Eyes: PERRLA, EOMs, conjunctiva no swelling or erythema Sinuses: No Frontal/maxillary tenderness ENT/Mouth: Ext aud canals clear, TMs without erythema, bulging. No erythema, swelling, or exudate on post pharynx.  Tonsils not swollen or erythematous. Hearing normal.  Neck: Supple, thyroid normal.  Respiratory: Respiratory effort normal, BS equal bilaterally without rales, rhonchi, wheezing or stridor.  Cardio: RRR with no MRGs. Brisk  peripheral pulses without edema.  Abdomen: Soft, + BS.  Non tender, no guarding, rebound, hernias, masses. Lymphatics: Non tender without lymphadenopathy.  Musculoskeletal: Full ROM, 5/5 strength, normal gait.  Skin: Warm, dry without rashes, lesions, ecchymosis.  Neuro: Cranial nerves intact. Normal muscle tone, no cerebellar symptoms. Sensation intact.  Psych: Awake and oriented X 3, normal affect, Insight and Judgment appropriate.     Vicie Mutters, PA-C 3:54 PM [facility]

## 2015-01-26 ENCOUNTER — Telehealth: Payer: Self-pay

## 2015-01-26 LAB — URIC ACID: Uric Acid, Serum: 6.1 mg/dL (ref 4.0–7.8)

## 2015-01-26 LAB — ANTI-DNA ANTIBODY, DOUBLE-STRANDED: ds DNA Ab: <1 IU/mL

## 2015-01-26 LAB — SEDIMENTATION RATE: Sed Rate: 11 mm/hr (ref 0–20)

## 2015-01-26 LAB — RHEUMATOID FACTOR: Rhuematoid fact SerPl-aCnc: <10 IU/mL (ref <=14)

## 2015-01-26 NOTE — Telephone Encounter (Signed)
err

## 2015-01-27 LAB — HLA-B27 ANTIGEN: DNA Result:: NOT DETECTED

## 2015-01-31 LAB — ANA: Anti Nuclear Antibody(ANA): NEGATIVE

## 2015-02-01 ENCOUNTER — Telehealth: Payer: Self-pay | Admitting: *Deleted

## 2015-02-01 NOTE — Telephone Encounter (Signed)
Pt contacted ins they prefer Dr Dierdre ForthBeekman for rheumat[facility] told [facility] they need a referral & notes to get appointment please send & follow up with patient

## 2015-02-10 ENCOUNTER — Other Ambulatory Visit: Payer: Self-pay | Admitting: Internal Medicine

## 2015-02-20 ENCOUNTER — Other Ambulatory Visit: Payer: Self-pay | Admitting: *Deleted

## 2015-02-20 MED ORDER — CELECOXIB 200 MG PO CAPS
ORAL_CAPSULE | ORAL | Status: DC
Start: 2015-02-20 — End: 2015-03-29

## 2015-02-23 ENCOUNTER — Telehealth: Payer: Self-pay

## 2015-02-23 NOTE — Telephone Encounter (Signed)
Received paper note from front office staff, [facility] faxed to [phone], I refaxed all labs but they have been faxed over 3 times now , originally by Lillia AbedLindsay, then again by me and today by me.

## 2015-03-20 ENCOUNTER — Other Ambulatory Visit: Payer: Self-pay | Admitting: Internal Medicine

## 2015-03-21 ENCOUNTER — Other Ambulatory Visit: Payer: Self-pay

## 2015-03-21 MED ORDER — ATENOLOL 100 MG PO TABS: 100.0000 mg | ORAL_TABLET | Freq: Every day | ORAL | Status: DC

## 2015-03-29 ENCOUNTER — Other Ambulatory Visit: Payer: Self-pay | Admitting: Internal Medicine

## 2015-03-30 ENCOUNTER — Ambulatory Visit: Payer: Self-pay | Admitting: Internal Medicine

## 2015-05-01 ENCOUNTER — Other Ambulatory Visit: Payer: Self-pay | Admitting: Internal Medicine

## 2015-05-02 ENCOUNTER — Encounter: Payer: Self-pay | Admitting: Internal Medicine

## 2015-05-02 ENCOUNTER — Ambulatory Visit (INDEPENDENT_AMBULATORY_CARE_PROVIDER_SITE_OTHER): Payer: BLUE CROSS/BLUE SHIELD | Admitting: Internal Medicine

## 2015-05-02 VITALS — BP 136/74 | HR 68 | Temp 97.3°F | Resp 18 | Ht 67.5 in | Wt 157.2 lb

## 2015-05-02 DIAGNOSIS — E559 Vitamin D deficiency, unspecified: Secondary | ICD-10-CM

## 2015-05-02 DIAGNOSIS — Z79899 Other long term (current) drug therapy: Secondary | ICD-10-CM

## 2015-05-02 DIAGNOSIS — E785 Hyperlipidemia, unspecified: Secondary | ICD-10-CM

## 2015-05-02 DIAGNOSIS — R7309 Other abnormal glucose: Secondary | ICD-10-CM

## 2015-05-02 DIAGNOSIS — I10 Essential (primary) hypertension: Secondary | ICD-10-CM

## 2015-05-02 LAB — CBC WITH DIFFERENTIAL/PLATELET
Basophils Absolute: 0 10*3/uL (ref 0.0–0.1)
Basophils Relative: 0 % (ref 0–1)
Eosinophils Absolute: 0.2 10*3/uL (ref 0.0–0.7)
Eosinophils Relative: 2 % (ref 0–5)
HCT: 46.9 % (ref 39.0–52.0)
Hemoglobin: 16.2 g/dL (ref 13.0–17.0)
Lymphocytes Relative: 22 % (ref 12–46)
Lymphs Abs: 2.1 10*3/uL (ref 0.7–4.0)
MCH: 33.5 pg (ref 26.0–34.0)
MCHC: 34.5 g/dL (ref 30.0–36.0)
MCV: 96.9 fL (ref 78.0–100.0)
MPV: 9.4 fL (ref 8.6–12.4)
Monocytes Absolute: 1.2 10*3/uL — ABNORMAL HIGH (ref 0.1–1.0)
Monocytes Relative: 13 % — ABNORMAL HIGH (ref 3–12)
Neutro Abs: 5.9 10*3/uL (ref 1.7–7.7)
Neutrophils Relative %: 63 % (ref 43–77)
Platelets: 275 10*3/uL (ref 150–400)
RBC: 4.84 MIL/uL (ref 4.22–5.81)
RDW: 13 % (ref 11.5–15.5)
WBC: 9.4 10*3/uL (ref 4.0–10.5)

## 2015-05-02 NOTE — Progress Notes (Signed)
Patient ID: [name] [name], male   DOB: 26-Apr-1951, 64 y.o.   MRN: [id number]   This very nice 64 y.o. DWM presents for 3 month follow up with Hypertension, Hyperlipidemia, Pre-Diabetes and Vitamin D Deficiency. 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 Since then he's had similar pains in the Rt buttock thigh areas and has seen Dr Ophelia Charter and also had EDSI by Dr Alvester Morin for these pains. He also was seen by Rheumatology with a negative w/u for connective tissue disorder. It was speculated that he may have a psoriatic arthritis.    Patient is treated for HTN  Since 1998 & BP has been controlled at home. Today's BP: 136/74 mmHg. Patient has had no complaints of any cardiac type chest pain, palpitations, dyspnea/orthopnea/PND, dizziness, claudication, or dependent edema.   Hyperlipidemia is controlled with diet & meds. Patient denies myalgias or other med SE's. Last Lipids were Total  Chol  216*; HDL 55; LDL  137*; Trig 119 on 01/20/2015.   Also, the patient has history of PreDiabetes and has had no symptoms of reactive hypoglycemia, diabetic polys, paresthesias or visual blurring.  Last A1c was  5.2% on 01/20/2015.   Further, the patient also has history of Vitamin D Deficiency and supplements vitamin D without any suspected side-effects. Last vitamin D was  70 on 01/20/2015.     Medication Sig  . amphetamine (ADDERALL) 20 MG tablet Take 20 mg by mouth 2 (two) times daily.  Marland Kitchen aspirin EC 325 MG tablet Take 1 tablet (325 mg total) by mouth daily.  Marland Kitchen atenolol (TENORMIN) 100 MG tablet Take 1 tablet (100 mg total) by mouth daily.  Marland Kitchen atenolol (TENORMIN) 100 MG tablet TAKE 1 BY MOUTH DAILY  . celecoxib (CELEBREX) 200 MG capsule TAKE 1 BY MOUTH DAILY  . Cholecalciferol (VITAMIN D PO) Take 5,000 Units by mouth daily.   . CycloSPORINE (RESTASIS OP) Place 0.5 drops into both eyes 2 (two) times daily.   Marland Kitchen esomeprazole (NEXIUM) 40 MG capsule Take 1 cap daily at 12 noon.  . hyoscyamine (LEVSIN SL)  0.125 MG SL    . lisinopril-hctz 20-25 MG  TAKE 1 BY MOUTH DAILY  . methocarbamol (ROBAXIN) 500 MG tablet Take 1 tablet (500 mg total) by mouth every 6 (six) hours as needed for muscle spasms (spasm).  . Omega-3 Fatty Acids (FISH OIL PO) Take 1,200 mg by mouth daily.   . TURMERIC PO Take 1,500 mg by mouth daily.  . valACYclovir (VALTREX) 500 MG tablet Take 500 mg by mouth 2 (two) times daily.  . zaleplon (SONATA) 10 MG capsule Take 1 capsule (10 mg total) by mouth at bedtime as needed for sleep.   No Known Allergies  PMHx:   Past Medical History  Diagnosis Date  . Hypertension   . Hyperlipidemia   . GERD (gastroesophageal reflux disease)   . Vitamin D deficiency   . Arthritis     right thumb  . Dry eyes     Takes Restasis   Immunization History  Administered Date(s) Administered  . Influenza Split 09/22/2013  . PPD Test 09/13/2014  . Pneumococcal Polysaccharide-23 01/19/2009  . Tdap 01/19/2009  . Zoster 01/29/2013   Past Surgical History  Procedure Laterality Date  . Esophagogastroduodenoscopy  1990  . Colonoscopy w/ polypectomy    . 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 Eyes: Denies redness, blurred vision, diplopia, discharge, itchy, watery eyes.  ENT: Denies discharge, congestion, post nasal drip, epistaxis, sore throat, earache, hearing loss, dental pain, tinnitus, vertigo, sinus pain, snoring.  CV: Denies chest pain, palpitations, irregular heartbeat, syncope, dyspnea, diaphoresis, orthopnea, PND, claudication or edema. Respiratory: denies cough, dyspnea, DOE, pleurisy, hoarseness, laryngitis, wheezing.  Gastrointestinal: Denies  dysphagia, odynophagia, heartburn, reflux, water brash, abdominal pain or cramps, nausea, vomiting, bloating, diarrhea, constipation, hematemesis, melena, hematochezia  or hemorrhoids. Genitourinary: Denies dysuria, frequency, urgency, nocturia, hesitancy, discharge, hematuria or flank pain. Musculoskeletal: Denies arthralgias, myalgias, stiffness, jt. swelling, pain, limping or strain/sprain.  Skin: Denies pruritus, rash, hives, warts, acne, eczema or change in skin lesion(s). Neuro: No weakness, tremor, incoordination, spasms, paresthesia or pain. Psychiatric: Denies confusion, memory loss or sensory loss. Endo: Denies change in weight, skin or hair change.  Heme/Lymph: No excessive bleeding, bruising or enlarged lymph nodes.  Physical Exam  BP 136/74 mmHg  Pulse 68  Temp(Src) 97.3 F (36.3 C)  Resp 18  Ht 5' 7.5" (1.715 m)  Wt 157 lb 3.2 oz (71.305 kg)  BMI 24.24 kg/m2  Appears well nourished and in no distress. Eyes: PERRLA, EOMs, conjunctiva no swelling or erythema. Sinuses: No frontal/maxillary tenderness ENT/Mouth: EAC's clear, TM's nl w/o erythema, bulging. Nares clear w/o erythema, swelling, exudates. Oropharynx clear without erythema or exudates. Oral hygiene is good. Tongue normal, non obstructing. Hearing intact.  Neck: Supple. Thyroid nl. Car 2+/2+ without bruits, nodes or JVD. Chest: Respirations nl with BS clear & equal w/o rales, rhonchi, wheezing or stridor.  Cor: Heart sounds normal w/ regular rate and rhythm without sig. murmurs, gallops, clicks, or rubs. Peripheral pulses normal and equal  without edema.  Abdomen: Soft & bowel sounds normal. Non-tender w/o guarding, rebound, hernias, masses, or organomegaly.  Lymphatics: Unremarkable.  Musculoskeletal: Full ROM all peripheral extremities, joint stability, 5/5 strength, and normal gait.  Skin: Warm, dry without exposed rashes, lesions or ecchymosis apparent. No psoriatic patches seen. Neuro: Cranial nerves intact,  reflexes equal bilaterally. Sensory-motor testing grossly intact. Tendon reflexes grossly intact.  Pysch: Alert & oriented x 3.  Insight and judgement nl & appropriate. No ideations.  Assessment and Plan:  1. Essential hypertension  - TSH  2. Hyperlipidemia  - Lipid panel  3. Blood glucose abnormal  - Hemoglobin A1c - Insulin, random  4. Vitamin D deficiency  - Vit D  25 hydroxy (rtn osteoporosis monitoring)  5. Medication management  - CBC with Differential/Platelet - BASIC METABOLIC PANEL WITH GFR - Hepatic function panel - Magnesium  6. RLE pains -   - encouraged to see Dr Ophelia CharterYates again for consideration of another AVN.   Recommended regular exercise, BP monitoring, weight control, and discussed med and SE's. Recommended labs to assess and monitor clinical status. Further disposition pending results of labs. Over 30 minutes of exam, counseling, chart review was performed

## 2015-05-02 NOTE — Patient Instructions (Signed)

## 2015-05-03 LAB — HEPATIC FUNCTION PANEL
ALT: 16 U/L (ref 0–53)
AST: 17 U/L (ref 0–37)
Albumin: 4.1 g/dL (ref 3.5–5.2)
Alkaline Phosphatase: 44 U/L (ref 39–117)
Bilirubin, Direct: 0.1 mg/dL (ref 0.0–0.3)
Indirect Bilirubin: 0.5 mg/dL (ref 0.2–1.2)
Total Bilirubin: 0.6 mg/dL (ref 0.2–1.2)
Total Protein: 6.2 g/dL (ref 6.0–8.3)

## 2015-05-03 LAB — HEMOGLOBIN A1C
Hgb A1c MFr Bld: 5.1 % (ref <5.7)
Mean Plasma Glucose: 100 mg/dL (ref <117)

## 2015-05-03 LAB — LIPID PANEL
Cholesterol: 218 mg/dL — ABNORMAL HIGH (ref 0–200)
HDL: 46 mg/dL (ref >=40)
LDL Cholesterol: 127 mg/dL — ABNORMAL HIGH (ref 0–99)
Total CHOL/HDL Ratio: 4.7 Ratio
Triglycerides: 227 mg/dL — ABNORMAL HIGH (ref <150)
VLDL: 45 mg/dL — ABNORMAL HIGH (ref 0–40)

## 2015-05-03 LAB — BASIC METABOLIC PANEL WITH GFR
BUN: 20 mg/dL (ref 6–23)
CO2: 26 mEq/L (ref 19–32)
Calcium: 9 mg/dL (ref 8.4–10.5)
Chloride: 101 mEq/L (ref 96–112)
Creat: 1.06 mg/dL (ref 0.50–1.35)
GFR, Est African American: 87 mL/min
GFR, Est Non African American: 75 mL/min
Glucose, Bld: 53 mg/dL — ABNORMAL LOW (ref 70–99)
Potassium: 4.2 mEq/L (ref 3.5–5.3)
Sodium: 135 mEq/L (ref 135–145)

## 2015-05-03 LAB — MAGNESIUM: Magnesium: 2.2 mg/dL (ref 1.5–2.5)

## 2015-05-03 LAB — INSULIN, RANDOM: Insulin: 13.5 u[IU]/mL (ref 2.0–19.6)

## 2015-05-03 LAB — TSH: TSH: 1.154 u[IU]/mL (ref 0.350–4.500)

## 2015-05-03 LAB — VITAMIN D 25 HYDROXY (VIT D DEFICIENCY, FRACTURES): Vit D, 25-Hydroxy: 72 ng/mL (ref 30–100)

## 2015-05-09 ENCOUNTER — Encounter: Payer: Self-pay | Admitting: Internal Medicine

## 2015-05-09 ENCOUNTER — Other Ambulatory Visit: Payer: Self-pay | Admitting: Internal Medicine

## 2015-05-09 DIAGNOSIS — B002 Herpesviral gingivostomatitis and pharyngotonsillitis: Secondary | ICD-10-CM

## 2015-05-09 MED ORDER — VALACYCLOVIR HCL 500 MG PO TABS: ORAL_TABLET | ORAL | Status: DC

## 2015-07-07 ENCOUNTER — Other Ambulatory Visit: Payer: Self-pay | Admitting: Internal Medicine

## 2015-07-21 ENCOUNTER — Other Ambulatory Visit: Payer: Self-pay | Admitting: Internal Medicine

## 2015-07-25 ENCOUNTER — Ambulatory Visit: Payer: Self-pay | Admitting: Internal Medicine

## 2015-07-25 ENCOUNTER — Encounter: Payer: Self-pay | Admitting: Internal Medicine

## 2015-07-25 ENCOUNTER — Ambulatory Visit (INDEPENDENT_AMBULATORY_CARE_PROVIDER_SITE_OTHER): Payer: BLUE CROSS/BLUE SHIELD | Admitting: Internal Medicine

## 2015-07-25 VITALS — BP 132/70 | HR 56 | Temp 97.3°F | Resp 16 | Ht 67.75 in | Wt 156.2 lb

## 2015-07-25 DIAGNOSIS — E559 Vitamin D deficiency, unspecified: Secondary | ICD-10-CM

## 2015-07-25 DIAGNOSIS — R7309 Other abnormal glucose: Secondary | ICD-10-CM

## 2015-07-25 DIAGNOSIS — Z79899 Other long term (current) drug therapy: Secondary | ICD-10-CM

## 2015-07-25 DIAGNOSIS — Z0000 Encounter for general adult medical examination without abnormal findings: Secondary | ICD-10-CM

## 2015-07-25 DIAGNOSIS — R5383 Other fatigue: Secondary | ICD-10-CM

## 2015-07-25 DIAGNOSIS — Z125 Encounter for screening for malignant neoplasm of prostate: Secondary | ICD-10-CM

## 2015-07-25 DIAGNOSIS — E785 Hyperlipidemia, unspecified: Secondary | ICD-10-CM

## 2015-07-25 DIAGNOSIS — B3742 Candidal balanitis: Secondary | ICD-10-CM

## 2015-07-25 DIAGNOSIS — Z111 Encounter for screening for respiratory tuberculosis: Secondary | ICD-10-CM

## 2015-07-25 DIAGNOSIS — I10 Essential (primary) hypertension: Secondary | ICD-10-CM

## 2015-07-25 DIAGNOSIS — Z6824 Body mass index (BMI) 24.0-24.9, adult: Secondary | ICD-10-CM

## 2015-07-25 DIAGNOSIS — Q809 Congenital ichthyosis, unspecified: Secondary | ICD-10-CM

## 2015-07-25 DIAGNOSIS — K219 Gastro-esophageal reflux disease without esophagitis: Secondary | ICD-10-CM

## 2015-07-25 DIAGNOSIS — Z1212 Encounter for screening for malignant neoplasm of rectum: Secondary | ICD-10-CM

## 2015-07-25 LAB — BASIC METABOLIC PANEL WITH GFR
BUN: 19 mg/dL (ref 7–25)
CO2: 27 mmol/L (ref 20–31)
Calcium: 9.4 mg/dL (ref 8.6–10.3)
Chloride: 99 mmol/L (ref 98–110)
Creat: 1.1 mg/dL (ref 0.70–1.25)
GFR, Est African American: 83 mL/min (ref >=60)
GFR, Est Non African American: 72 mL/min (ref >=60)
Glucose, Bld: 107 mg/dL — ABNORMAL HIGH (ref 65–99)
Potassium: 4.1 mmol/L (ref 3.5–5.3)
Sodium: 137 mmol/L (ref 135–146)

## 2015-07-25 LAB — CBC WITH DIFFERENTIAL/PLATELET
Basophils Absolute: 0 10*3/uL (ref 0.0–0.1)
Basophils Relative: 0 % (ref 0–1)
Eosinophils Absolute: 0.2 10*3/uL (ref 0.0–0.7)
Eosinophils Relative: 2 % (ref 0–5)
HCT: 47 % (ref 39.0–52.0)
Hemoglobin: 16.8 g/dL (ref 13.0–17.0)
Lymphocytes Relative: 19 % (ref 12–46)
Lymphs Abs: 1.4 10*3/uL (ref 0.7–4.0)
MCH: 35.1 pg — ABNORMAL HIGH (ref 26.0–34.0)
MCHC: 35.7 g/dL (ref 30.0–36.0)
MCV: 98.1 fL (ref 78.0–100.0)
MPV: 9.8 fL (ref 8.6–12.4)
Monocytes Absolute: 0.6 10*3/uL (ref 0.1–1.0)
Monocytes Relative: 8 % (ref 3–12)
Neutro Abs: 5.3 10*3/uL (ref 1.7–7.7)
Neutrophils Relative %: 71 % (ref 43–77)
Platelets: 227 10*3/uL (ref 150–400)
RBC: 4.79 MIL/uL (ref 4.22–5.81)
RDW: 12.6 % (ref 11.5–15.5)
WBC: 7.5 10*3/uL (ref 4.0–10.5)

## 2015-07-25 LAB — HEPATIC FUNCTION PANEL
ALT: 18 U/L (ref 9–46)
AST: 21 U/L (ref 10–35)
Albumin: 4.2 g/dL (ref 3.6–5.1)
Alkaline Phosphatase: 39 U/L — ABNORMAL LOW (ref 40–115)
Bilirubin, Direct: 0.1 mg/dL (ref <=0.2)
Indirect Bilirubin: 0.7 mg/dL (ref 0.2–1.2)
Total Bilirubin: 0.8 mg/dL (ref 0.2–1.2)
Total Protein: 6.6 g/dL (ref 6.1–8.1)

## 2015-07-25 LAB — LIPID PANEL
Cholesterol: 213 mg/dL — ABNORMAL HIGH (ref 125–200)
HDL: 57 mg/dL (ref >=40)
LDL Cholesterol: 129 mg/dL (ref <130)
Total CHOL/HDL Ratio: 3.7 Ratio (ref <=5.0)
Triglycerides: 135 mg/dL (ref <150)
VLDL: 27 mg/dL (ref <30)

## 2015-07-25 LAB — MAGNESIUM: Magnesium: 2 mg/dL (ref 1.5–2.5)

## 2015-07-25 LAB — IRON AND TIBC
%SAT: 50 % (ref 20–55)
Iron: 168 ug/dL — ABNORMAL HIGH (ref 42–165)
TIBC: 338 ug/dL (ref 215–435)
UIBC: 170 ug/dL (ref 125–400)

## 2015-07-25 LAB — HEMOGLOBIN A1C
Hgb A1c MFr Bld: 5 % (ref <5.7)
Mean Plasma Glucose: 97 mg/dL (ref <117)

## 2015-07-25 LAB — VITAMIN B12: Vitamin B-12: 427 pg/mL (ref 211–911)

## 2015-07-25 LAB — TSH: TSH: 1.506 u[IU]/mL (ref 0.350–4.500)

## 2015-07-25 MED ORDER — FLUCONAZOLE 150 MG PO TABS: ORAL_TABLET | ORAL | Status: DC

## 2015-07-25 MED ORDER — CYCLOSPORINE 0.05 % OP EMUL: 1.0000 [drp] | Freq: Two times a day (BID) | OPHTHALMIC | Status: DC

## 2015-07-25 NOTE — Patient Instructions (Signed)

## 2015-07-25 NOTE — Progress Notes (Signed)
Patient ID: [name] [name], male   DOB: 06-Dec-1951, 64 y.o.   MRN: [id number]   Comprehensive Examination  This very nice 64 y.o. DWM presents for complete physical.  Patient has been followed for labile HTN, Prediabetes, Hyperlipidemia, and Vitamin D Deficiency. Patient is also concerned about a rash on his penis.    Patient has been followed expectently for a number of years circa 1988-1990 for labile HTN. Patient's BP has been controlled and today's BP: 132/70 mmHg. Patient denies any cardiac symptoms as chest pain, palpitations, shortness of breath, dizziness or ankle swelling.   Patient's hyperlipidemia is notcontrolled with diet as he stopped his PravaSTATIN in Jan 2016 due to prox muscle weakess & aching which resolved with d/c of the meds. Patient denies myalgias or other medication SE's. Last lipids were 05/02/2015: Cholesterol 218*; HDL 46; LDL 127*; Triglycerides 227 on      Patient is screened for prediabetes and patient denies reactive hypoglycemic symptoms, visual blurring, diabetic polys or paresthesias. Last A1c was 5.1% on 05/02/2015.   Finally, patient has history of Vitamin D Deficiency of 28 in 2008 and last vitamin D was 72 on 05/02/2015.  Medication Sig  . amphetamine-dextroamphetamine (ADDERALL) 20 MG tablet Take 20 mg by mouth 2 (two) times daily.  Marland Kitchen aspirin EC 325 MG tablet Take 1 tablet (325 mg total) by mouth daily.  Marland Kitchen atenolol  100 MG tablet TAKE 1 BY MOUTH DAILY  . celecoxib 200 MG capsule TAKE 1 BY MOUTH DAILY  . VITAMIN D Take 5,000 Units by mouth daily.   . CycloSPORINE (RESTASIS ) Place 0.5 drops into both eyes 2 (two) times daily.   Marland Kitchen esomeprazole   40 MG capsule  Take 40 mg by mouth every other day. )  . hyoscyamine (LEVSIN SL) 0.125 MG SL tablet   . lisinopril-hctz 20-25 MG per tablet TAKE 1 BY MOUTH DAILY  . methocarbamol (ROBAXIN) 500 MG tablet Take 1 tablet (500 mg total) by mouth every 6 (six) hours as needed for muscle spasms (spasm).  Marland Kitchen FISH OIL  Take  1,200 mg by mouth daily.   . TURMERIC PO Take 1,500 mg by mouth daily.  . valACYclovir 500 MG tablet Take 1 tablet 3 x daily for Fever Blisters  . zaleplon 10 MG capsule Take 1 capsule (10 mg total) by mouth at bedtime as needed for sleep.   No Known Allergies   Past Medical History  Diagnosis Date  . Hypertension   . Hyperlipidemia   . GERD (gastroesophageal reflux disease)   . Vitamin D deficiency   . Arthritis     right thumb  . Dry eyes     Takes Restasis   Health Maintenance  Topic Date Due  . Hepatitis C Screening  Aug 23, 1951  . HIV Screening  08/13/1967  . INFLUENZA VACCINE  07/10/2015  . COLONOSCOPY  03/11/2018  . TETANUS/TDAP  01/19/2019  . ZOSTAVAX  Completed   Immunization History  Administered Date(s) Administered  . Influenza Split 09/22/2013  . PPD Test 09/13/2014  . Pneumococcal Polysaccharide-23 01/19/2009  . Tdap 01/19/2009  . Zoster 01/29/2013   Past Surgical History  Procedure Laterality Date  . Esophagogastroduodenoscopy  1990  . Colonoscopy w/ polypectomy    . 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 Osteoporosis Mother   .  Schizophrenia Mother   . Heart disease Father   . COPD Father   . Diabetes Brother    Social History   Social History  . Marital Status: Divorced    Spouse Name: N/A  . Number of Children: N/A  . Years of Education: N/A   Occupational History  . Not on file.   Social History Main Topics  . Smoking status: Never Smoker   . Smokeless tobacco: Not on file  . Alcohol Use: Yes     Comment: social  . Drug Use: No  . Active     ROS Constitutional: Denies fever, chills, weight loss/gain, headaches, insomnia,  night sweats or change in appetite. Does c/o fatigue. Eyes: Denies redness, blurred vision, diplopia, discharge,  itchy or watery eyes. Has hx/o dry eyes controlled only w/Restasis.  ENT: Denies discharge, congestion, post nasal drip, epistaxis, sore throat, earache, hearing loss, dental pain, Tinnitus, Vertigo, Sinus pain or snoring.  Cardio: Denies chest pain, palpitations, irregular heartbeat, syncope, dyspnea, diaphoresis, orthopnea, PND, claudication or edema Respiratory: denies cough, dyspnea, DOE, pleurisy, hoarseness, laryngitis or wheezing.  Gastrointestinal: Denies dysphagia, heartburn, reflux, water brash, pain, cramps, nausea, vomiting, bloating, diarrhea, constipation, hematemesis, melena, hematochezia, jaundice or hemorrhoids Genitourinary: Denies dysuria, frequency, urgency, nocturia, hesitancy, discharge, hematuria or flank pain Musculoskeletal: Denies arthralgia, myalgia, stiffness, Jt. Swelling, pain, limp or strain/sprain. Denies Falls. Skin: Denies puritis, rash, hives, warts, acne, eczema or change in skin lesion Neuro: No weakness, tremor, incoordination, spasms, paresthesia or pain Psychiatric: Denies confusion, memory loss or sensory loss. Denies Depression. Endocrine: Denies change in weight, skin, hair change, nocturia, and paresthesia, diabetic polys, visual blurring or hyper / hypo glycemic episodes.  Heme/Lymph: No excessive bleeding, bruising or enlarged lymph nodes.  Physical Exam  BP 132/70   Pulse 56  Temp  97.3 F   Resp 16  Ht 5' 7.75"   Wt 156 lb 3.2 oz     BMI 23.92  General Appearance: Well nourished, in no apparent distress. Eyes: PERRLA, EOMs, conjunctiva no swelling or erythema, normal fundi and vessels. Sinuses: No frontal/maxillary tenderness ENT/Mouth: EACs patent / TMs  nl. Nares clear without erythema, swelling, mucoid exudates. Oral hygiene is good. No erythema, swelling, or exudate. Tongue normal, non-obstructing. Tonsils not swollen or erythematous. Hearing normal.  Neck: Supple, thyroid normal. No bruits, nodes or JVD. Respiratory: Respiratory effort  normal.  BS equal and clear bilateral without rales, rhonci, wheezing or stridor. Cardio: Heart sounds are normal with regular rate and rhythm and no murmurs, rubs or gallops. Peripheral pulses are normal and equal bilaterally without edema. No aortic or femoral bruits. Chest: symmetric with normal excursions and percussion.  Abdomen: Flat, soft, with bowel sounds. Nontender, no guarding, rebound, hernias, masses, or organomegaly.  Lymphatics: Non tender without lymphadenopathy.  Genitourinary: No hernias.Testes nl. DRE - prostate nl for age - smooth & firm w/o nodules. Erythematous velvety rash of the distal penis & glans. Musculoskeletal: Full ROM all peripheral extremities, joint stability, 5/5 strength, and normal gait. Skin: Warm and dry without rashes, lesions, cyanosis, clubbing or  ecchymosis.  Neuro: Cranial nerves intact, reflexes equal bilaterally. Normal muscle tone, no cerebellar symptoms. Sensation intact.  Pysch: Awake and oriented X 3 with normal affect, insight and judgment appropriate.   Assessment and Plan  1. Essential hypertension  - Microalbumin / creatinine urine ratio - EKG 12-Lead - [place], RETROPERITNL ABD,  LTD - TSH  2. Hyperlipidemia  - Lipid panel  3. Blood glucose abnormal  - Hemoglobin A1c -  Insulin, random  4. Vitamin D deficiency  - Vit D  25 hydroxy   5. Gastroesophageal reflux disease   6. Screening for rectal cancer  - POC Hemoccult Bld/Stl   7. Prostate cancer screening  - PSA  8. Body mass index (BMI) of 24.0-24.9 in adult   9. Other fatigue  - Vitamin B12 - Testosterone - Iron and TIBC - TSH  10. 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  Dispense: 72 mL; Refill: 99  12. Candidiasis of penis  -  fluconazole (DIFLUCAN) 150 MG tablet; Take 1 tablet 2 x week  - Mon & Thurs for yeast infection  Dispense: 8 tablet; Refill: 1  13. Screening examination for pulmonary tuberculosis  - PPD   Continue prudent diet as discussed, weight control, BP monitoring, regular exercise, and medications as discussed.  Discussed med effects and SE's. Routine screening labs and tests as requested with regular follow-up as recommended.  Over 40 minutes of exam, counseling &  chart review was performed

## 2015-07-26 LAB — PSA: PSA: 0.46 ng/mL (ref <=4.00)

## 2015-07-26 LAB — URINALYSIS, MICROSCOPIC ONLY
Bacteria, UA: NONE SEEN [HPF]
Casts: NONE SEEN [LPF]
Crystals: NONE SEEN [HPF]
RBC / HPF: NONE SEEN RBC/HPF (ref <=2)
Squamous Epithelial / LPF: NONE SEEN [HPF] (ref <=5)
WBC, UA: NONE SEEN WBC/HPF (ref <=5)
Yeast: NONE SEEN [HPF]

## 2015-07-26 LAB — TESTOSTERONE: Testosterone: 515 ng/dL (ref 300–890)

## 2015-07-26 LAB — VITAMIN D 25 HYDROXY (VIT D DEFICIENCY, FRACTURES): Vit D, 25-Hydroxy: 74 ng/mL (ref 30–100)

## 2015-07-26 LAB — MICROALBUMIN / CREATININE URINE RATIO
Creatinine, Urine: 125.5 mg/dL
Microalb Creat Ratio: 2.4 mg/g (ref 0.0–30.0)
Microalb, Ur: 0.3 mg/dL (ref <2.0)

## 2015-07-26 LAB — INSULIN, RANDOM: Insulin: 28.6 u[IU]/mL — ABNORMAL HIGH (ref 2.0–19.6)

## 2015-07-31 ENCOUNTER — Encounter: Payer: Self-pay | Admitting: Internal Medicine

## 2015-07-31 ENCOUNTER — Other Ambulatory Visit: Payer: Self-pay | Admitting: Internal Medicine

## 2015-07-31 DIAGNOSIS — E782 Mixed hyperlipidemia: Secondary | ICD-10-CM

## 2015-07-31 MED ORDER — ATORVASTATIN CALCIUM 80 MG PO TABS: 80.0000 mg | ORAL_TABLET | Freq: Every day | ORAL | Status: DC

## 2015-08-22 IMAGING — CR DG CHEST 2V
2 series · 2 of 2 positions shown · non-contrast
Comparison: PA and lateral chest of February 24, 2008

CLINICAL DATA: Preoperative for total hip arthroplasty

EXAM:
CHEST  2 VIEW

[w chest pa]
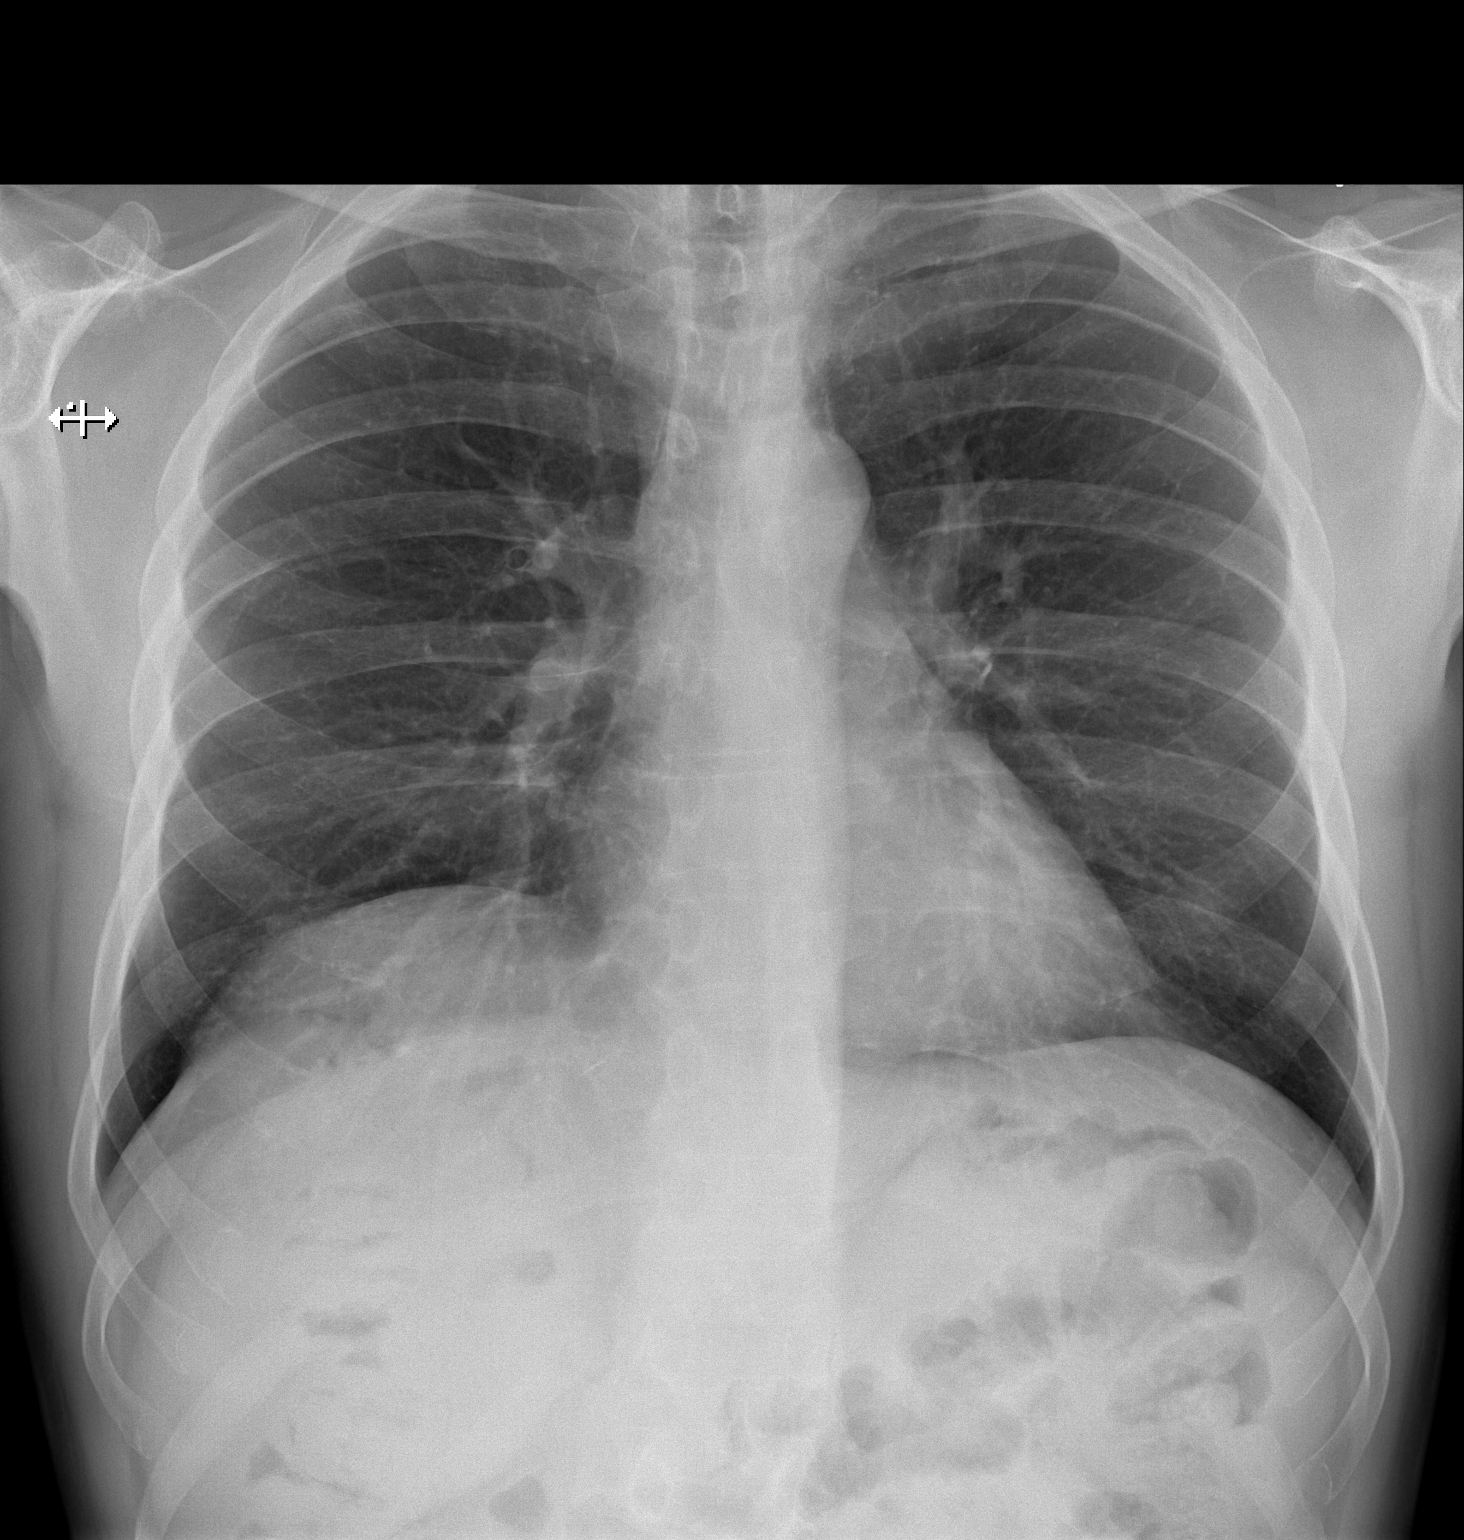

[w chest lat]
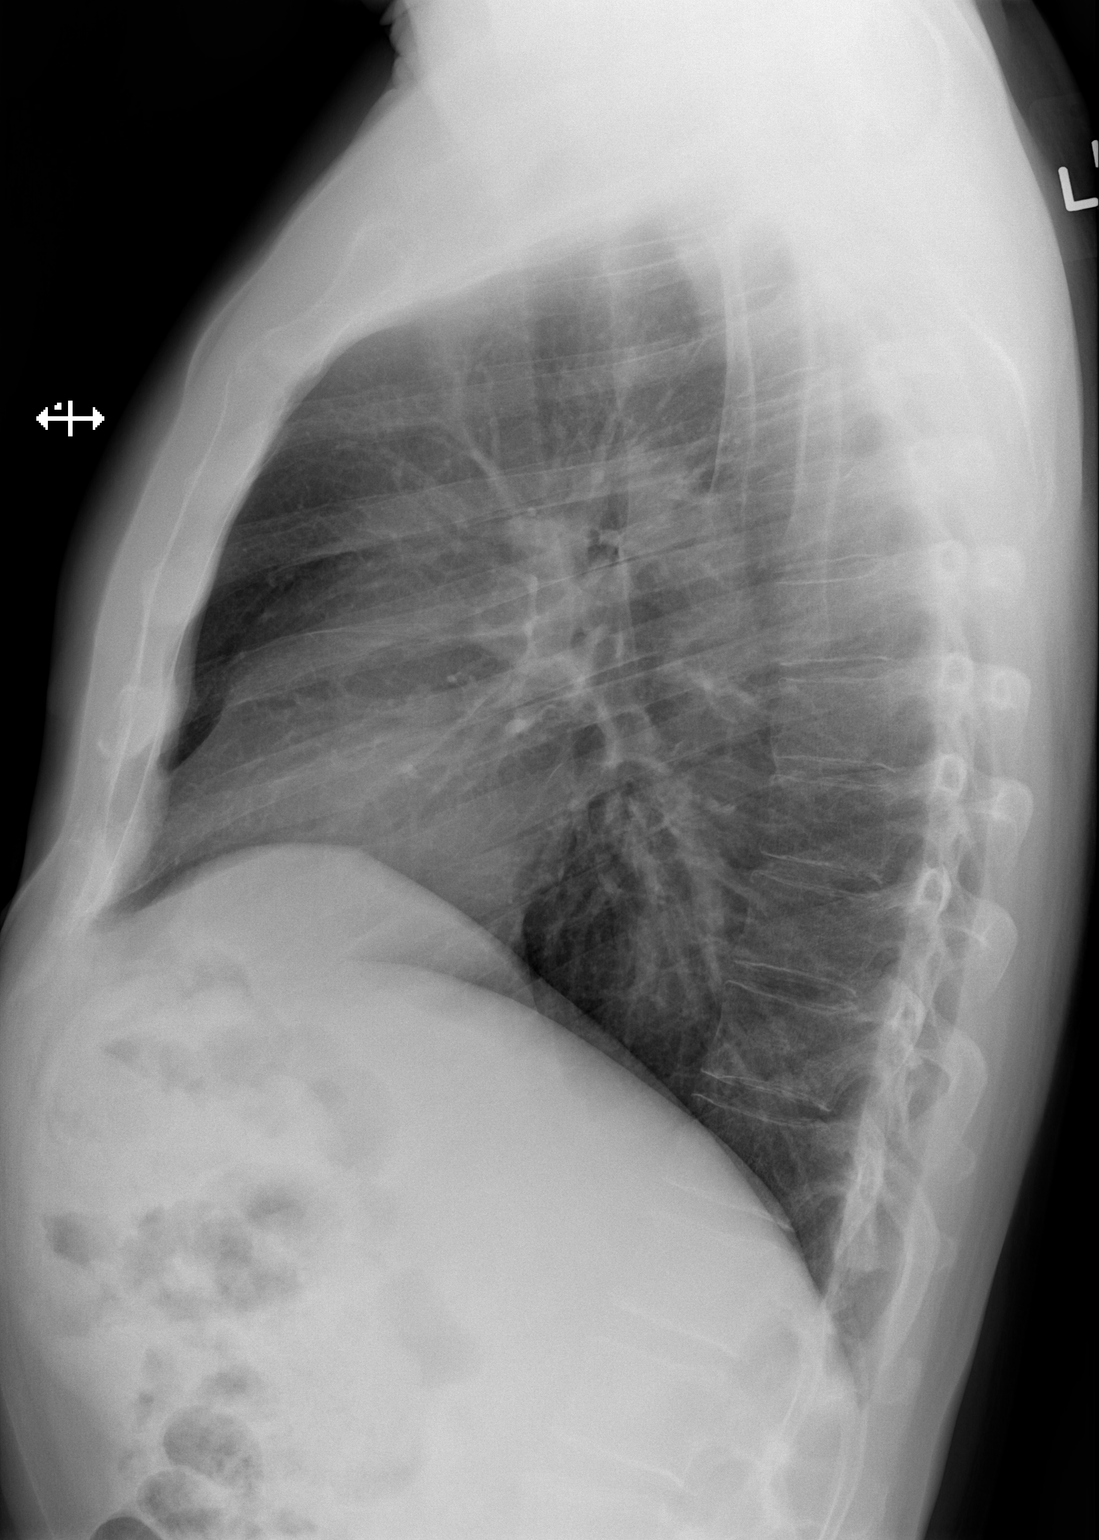

[2 of 2 positions shown; findings below may reference images not displayed]

FINDINGS: The lungs are adequately inflated and clear. The heart and pulmonary
vascularity are normal. The mediastinum is normal in width. There is
no pleural effusion. The bony thorax is unremarkable.
IMPRESSION: There is no active cardiopulmonary disease.

## 2015-09-14 ENCOUNTER — Encounter: Payer: Self-pay | Admitting: Internal Medicine

## 2015-09-25 ENCOUNTER — Other Ambulatory Visit: Payer: Self-pay | Admitting: Internal Medicine

## 2015-11-15 ENCOUNTER — Other Ambulatory Visit: Payer: Self-pay | Admitting: Orthopaedic Surgery

## 2015-11-15 DIAGNOSIS — M25551 Pain in right hip: Secondary | ICD-10-CM

## 2015-11-16 ENCOUNTER — Other Ambulatory Visit: Payer: Self-pay

## 2015-11-16 DIAGNOSIS — E782 Mixed hyperlipidemia: Secondary | ICD-10-CM

## 2015-11-16 MED ORDER — ATORVASTATIN CALCIUM 80 MG PO TABS: 80.0000 mg | ORAL_TABLET | Freq: Every day | ORAL | Status: DC

## 2015-11-18 ENCOUNTER — Ambulatory Visit
Admission: RE | Admit: 2015-11-18 | Discharge: 2015-11-18 | Disposition: A | Discharge status: Home / Self Care | Payer: BLUE CROSS/BLUE SHIELD | Source: Ambulatory Visit | Attending: Orthopaedic Surgery | Admitting: Orthopaedic Surgery

## 2015-11-18 DIAGNOSIS — M25551 Pain in right hip: Secondary | ICD-10-CM

## 2015-12-08 ENCOUNTER — Encounter: Payer: Self-pay | Admitting: Internal Medicine

## 2015-12-08 ENCOUNTER — Ambulatory Visit (INDEPENDENT_AMBULATORY_CARE_PROVIDER_SITE_OTHER): Payer: BLUE CROSS/BLUE SHIELD | Admitting: Internal Medicine

## 2015-12-08 VITALS — BP 118/64 | HR 66 | Temp 98.0°F | Resp 18 | Ht 67.75 in | Wt 156.0 lb

## 2015-12-08 DIAGNOSIS — M5441 Lumbago with sciatica, right side: Secondary | ICD-10-CM | POA: Diagnosis not present

## 2015-12-08 DIAGNOSIS — M109 Gout, unspecified: Secondary | ICD-10-CM

## 2015-12-08 LAB — URIC ACID: Uric Acid, Serum: 7.2 mg/dL (ref 4.0–7.8)

## 2015-12-08 LAB — C-REACTIVE PROTEIN: CRP: <0.5 mg/dL (ref <0.60)

## 2015-12-08 MED ORDER — GABAPENTIN 100 MG PO CAPS: 100.0000 mg | ORAL_CAPSULE | Freq: Three times a day (TID) | ORAL | Status: DC

## 2015-12-08 NOTE — Patient Instructions (Addendum)
Please take 1 tablet nightly for 3 days.  If you have no side effects than you can take a tablet first thing in the morning.  If you tolerate this well you can take the tablet up to 3 times per day.    Katrina will call you in approximately 1-2 weeks.  If you don't hear from us about your referral call us back.    Gabapentin capsules or tablets What is this medicine? GABAPENTIN (GA ba pen tin) is used to control partial seizures in adults with epilepsy. It is also used to treat certain types of nerve pain. This medicine may be used for other purposes; ask your health care provider or pharmacist if you have questions. What should I tell my health care provider before I take this medicine? They need to know if you have any of these conditions: -kidney disease -suicidal thoughts, plans, or attempt; a previous suicide attempt by you or a family member -an unusual or allergic reaction to gabapentin, other medicines, foods, dyes, or preservatives -pregnant or trying to get pregnant -breast-feeding How should I use this medicine? Take this medicine by mouth with a glass of water. Follow the directions on the prescription label. You can take it with or without food. If it upsets your stomach, take it with food.Take your medicine at regular intervals. Do not take it more often than directed. Do not stop taking except on your doctor's advice. If you are directed to break the 600 or 800 mg tablets in half as part of your dose, the extra half tablet should be used for the next dose. If you have not used the extra half tablet within 28 days, it should be thrown away. A special MedGuide will be given to you by the pharmacist with each prescription and refill. Be sure to read this information carefully each time. Talk to your pediatrician regarding the use of this medicine in children. Special care may be needed. Overdosage: If you think you have taken too much of this medicine contact a poison control center  or emergency room at once. NOTE: This medicine is only for you. Do not share this medicine with others. What if I miss a dose? If you miss a dose, take it as soon as you can. If it is almost time for your next dose, take only that dose. Do not take double or extra doses. What may interact with this medicine? Do not take this medicine with any of the following medications: -other gabapentin products This medicine may also interact with the following medications: -alcohol -antacids -antihistamines for allergy, cough and cold -certain medicines for anxiety or sleep -certain medicines for depression or psychotic disturbances -homatropine; hydrocodone -naproxen -narcotic medicines (opiates) for pain -phenothiazines like chlorpromazine, mesoridazine, prochlorperazine, thioridazine This list may not describe all possible interactions. Give your health care provider a list of all the medicines, herbs, non-prescription drugs, or dietary supplements you use. Also tell them if you smoke, drink alcohol, or use illegal drugs. Some items may interact with your medicine. What should I watch for while using this medicine? Visit your doctor or health care professional for regular checks on your progress. You may want to keep a record at home of how you feel your condition is responding to treatment. You may want to share this information with your doctor or health care professional at each visit. You should contact your doctor or health care professional if your seizures get worse or if you have any new types of  seizures. Do not stop taking this medicine or any of your seizure medicines unless instructed by your doctor or health care professional. Stopping your medicine suddenly can increase your seizures or their severity. Wear a medical identification bracelet or chain if you are taking this medicine for seizures, and carry a card that lists all your medications. You may get drowsy, dizzy, or have blurred  vision. Do not drive, use machinery, or do anything that needs mental alertness until you know how this medicine affects you. To reduce dizzy or fainting spells, do not sit or stand up quickly, especially if you are an older patient. Alcohol can increase drowsiness and dizziness. Avoid alcoholic drinks. Your mouth may get dry. Chewing sugarless gum or sucking hard candy, and drinking plenty of water will help. The use of this medicine may increase the chance of suicidal thoughts or actions. Pay special attention to how you are responding while on this medicine. Any worsening of mood, or thoughts of suicide or dying should be reported to your health care professional right away. Women who become pregnant while using this medicine may enroll in the [place] Antiepileptic Drug Pregnancy Registry by calling [phone]. This registry collects information about the safety of antiepileptic drug use during pregnancy. What side effects may I notice from receiving this medicine? Side effects that you should report to your doctor or health care professional as soon as possible: -allergic reactions like skin rash, itching or hives, swelling of the face, lips, or tongue -worsening of mood, thoughts or actions of suicide or dying Side effects that usually do not require medical attention (report to your doctor or health care professional if they continue or are bothersome): -constipation -difficulty walking or controlling muscle movements -dizziness -nausea -slurred speech -tiredness -tremors -weight gain This list may not describe all possible side effects. Call your doctor for medical advice about side effects. You may report side effects to FDA at 1-800-FDA-1088. Where should I keep my medicine? Keep out of reach of children. This medicine may cause accidental overdose and death if it taken by other adults, children, or pets. Mix any unused medicine with a substance like cat litter or coffee  grounds. Then throw the medicine away in a sealed container like a sealed bag or a coffee can with a lid. Do not use the medicine after the expiration date. Store at room temperature between 15 and 30 degrees C (59 and 86 degrees F). NOTE: This sheet is a summary. It may not cover all possible information. If you have questions about this medicine, talk to your doctor, pharmacist, or health care provider.    2016, Elsevier/Gold Standard. (2014-01-21 15:26:50)

## 2015-12-08 NOTE — Progress Notes (Signed)
Subjective:    Patient ID: [name] [name], male    DOB: October 30, 1951, 64 y.o.   MRN: [id number]  HPI  Patient presents to the office for evaluation of right sided hip pain and low back pain which has been going on for th past 18 months.  He reports that he has a constant pain which has been bothering him and is preventing him from sleeping, and not being able to walk well. He reports that thus far he has had physical therapy, chiropracters, and has also seen neurology, neurosurgery. He has also had epidural injections.  He reports that they have provided him no relief.  He reports that he has also had injections in his hip as well. He reports that he has such an issue with sleeping and walking that he feels like he is starting to have a lot of depression.  He has been following with Dr. Lorin Mercy and Dr. Lorin Mercy would like to have him get his uric acid level tested.  He also had a question as to whether this may be coming from the statin.  He reports that he has been taking lipitor for 4-5 months.  He reports that this started well before that.    Review of Systems  Constitutional: Negative for fever, chills and fatigue.  Respiratory: Negative for chest tightness and shortness of breath.   Cardiovascular: Negative for chest pain and palpitations.  Gastrointestinal: Negative for nausea, vomiting, abdominal pain, diarrhea, abdominal distention and anal bleeding.  Musculoskeletal: Positive for back pain, arthralgias and gait problem. Negative for myalgias, joint swelling, neck pain and neck stiffness.  Skin: Negative for color change.  Neurological: Positive for numbness. Negative for dizziness, tremors and weakness.       Objective:   Physical Exam  Constitutional: He is oriented to person, place, and time. He appears well-developed and well-nourished. No distress.  HENT:  Head: Normocephalic.  Mouth/Throat: Oropharynx is clear and moist. No oropharyngeal exudate.  Eyes: Conjunctivae are normal.  No scleral icterus.  Neck: Normal range of motion. Neck supple.  Cardiovascular: Normal rate, regular rhythm, normal heart sounds and intact distal pulses.  Exam reveals no gallop and no friction rub.   No murmur heard. Pulmonary/Chest: Effort normal and breath sounds normal. No respiratory distress. He has no wheezes. He has no rales. He exhibits no tenderness.  Abdominal: Soft. Bowel sounds are normal. He exhibits no distension and no mass. There is no tenderness. There is no rebound and no guarding.  Musculoskeletal:       Right hip: He exhibits decreased range of motion. He exhibits normal strength, no tenderness, no bony tenderness, no swelling, no crepitus, no deformity and no laceration.       Lumbar back: He exhibits decreased range of motion, tenderness and pain. He exhibits no bony tenderness, no swelling, no edema, no deformity, no laceration, no spasm and normal pulse.  Walks with antalgic gait to the right.  Neurological: He is alert and oriented to person, place, and time.  Skin: Skin is warm and dry. He is not diaphoretic.  Psychiatric: He has a normal mood and affect. His behavior is normal. Judgment and thought content normal.  Nursing note and vitals reviewed.   Filed Vitals:   12/08/15 0931  BP: 118/64  Pulse: 66  Temp: 98 F (36.7 C)  Resp: 18        Assessment & Plan:    1. Midline low back pain with right-sided sciatica -reviewed records and after speaking  with patient history sounds most consistent with radicular pain.  Will get to a second opinion and in the mean time will try to treat radicular pain with gabapentin. Referral to [facility] was sent in.  Will check for inflammatory measures but doubt that there is concrete answer to why this is occuring.    - gabapentin (NEURONTIN) 100 MG capsule; Take 1 capsule (100 mg total) by mouth 3 (three) times daily.  Dispense: 90 capsule; Refill: 0 - AMB referral to orthopedics - Uric acid - C-reactive protein - Sed  Rate (ESR)

## 2015-12-09 LAB — SEDIMENTATION RATE: Sed Rate: 1 mm/hr (ref 0–20)

## 2015-12-18 ENCOUNTER — Other Ambulatory Visit: Payer: Self-pay | Admitting: Internal Medicine

## 2016-01-23 ENCOUNTER — Encounter: Payer: Self-pay | Admitting: Physician Assistant

## 2016-01-23 ENCOUNTER — Ambulatory Visit (INDEPENDENT_AMBULATORY_CARE_PROVIDER_SITE_OTHER): Payer: BLUE CROSS/BLUE SHIELD | Admitting: Physician Assistant

## 2016-01-23 VITALS — BP 110/70 | HR 67 | Temp 97.7°F | Resp 16 | Ht 67.75 in | Wt 160.2 lb

## 2016-01-23 DIAGNOSIS — E559 Vitamin D deficiency, unspecified: Secondary | ICD-10-CM

## 2016-01-23 DIAGNOSIS — I10 Essential (primary) hypertension: Secondary | ICD-10-CM

## 2016-01-23 DIAGNOSIS — M5441 Lumbago with sciatica, right side: Secondary | ICD-10-CM | POA: Diagnosis not present

## 2016-01-23 DIAGNOSIS — E785 Hyperlipidemia, unspecified: Secondary | ICD-10-CM

## 2016-01-23 DIAGNOSIS — J0100 Acute maxillary sinusitis, unspecified: Secondary | ICD-10-CM | POA: Diagnosis not present

## 2016-01-23 DIAGNOSIS — Z79899 Other long term (current) drug therapy: Secondary | ICD-10-CM | POA: Diagnosis not present

## 2016-01-23 DIAGNOSIS — M2550 Pain in unspecified joint: Secondary | ICD-10-CM

## 2016-01-23 DIAGNOSIS — R7309 Other abnormal glucose: Secondary | ICD-10-CM

## 2016-01-23 LAB — HEMOGLOBIN A1C
Hgb A1c MFr Bld: 5.1 % (ref <5.7)
Mean Plasma Glucose: 100 mg/dL (ref <117)

## 2016-01-23 LAB — CBC WITH DIFFERENTIAL/PLATELET
Basophils Absolute: 0 10*3/uL (ref 0.0–0.1)
Basophils Relative: 0 % (ref 0–1)
Eosinophils Absolute: 0.2 10*3/uL (ref 0.0–0.7)
Eosinophils Relative: 2 % (ref 0–5)
HCT: 48.2 % (ref 39.0–52.0)
Hemoglobin: 16.6 g/dL (ref 13.0–17.0)
Lymphocytes Relative: 17 % (ref 12–46)
Lymphs Abs: 1.6 10*3/uL (ref 0.7–4.0)
MCH: 33.1 pg (ref 26.0–34.0)
MCHC: 34.4 g/dL (ref 30.0–36.0)
MCV: 96 fL (ref 78.0–100.0)
MPV: 9.8 fL (ref 8.6–12.4)
Monocytes Absolute: 1.3 10*3/uL — ABNORMAL HIGH (ref 0.1–1.0)
Monocytes Relative: 14 % — ABNORMAL HIGH (ref 3–12)
Neutro Abs: 6.2 10*3/uL (ref 1.7–7.7)
Neutrophils Relative %: 67 % (ref 43–77)
Platelets: 298 10*3/uL (ref 150–400)
RBC: 5.02 MIL/uL (ref 4.22–5.81)
RDW: 12.2 % (ref 11.5–15.5)
WBC: 9.2 10*3/uL (ref 4.0–10.5)

## 2016-01-23 MED ORDER — AZITHROMYCIN 250 MG PO TABS: ORAL_TABLET | ORAL | Status: AC

## 2016-01-23 MED ORDER — PREDNISONE 20 MG PO TABS: ORAL_TABLET | ORAL | Status: DC

## 2016-01-23 NOTE — Patient Instructions (Signed)
Sinusitis can be uncomfortable. People with sinusitis have congestion with yellow/green/gray discharge, sinus pain/pressure, pain around the eyes. Sinus infections almost ALWAYS stem from a viral infection and antibiotics don't work against a virus. Even when bacteria is responsible, the infections usually clear up on their own in a week or so.   PLEASE TRY TO DO OVER THE COUNTER TREATMENT AND PREDNISONE FOR 5-7 DAYS AND IF YOU ARE NOT GETTING BETTER OR GETTING WORSE THEN YOU CAN START ON AN ANTIBIOTIC GIVEN.  Can take the prednisone AT NIGHT WITH DINNER, it take 8-12 hours to start working so it will NOT affect your sleeping if you take it at night with your food!! Take two pills the first night and 1 or two pill the second night and then 1 pill the other nights.   Risk of antibiotic use: About 1 in 4 people who take antibiotics have side effects including stomach problems, dizziness, or rashes. Those problems clear up soon after stopping the drugs, but in rare cases antibiotics can cause severe allergic reaction. Over use of antibiotics also encourages the growth of bacteria that can't be controlled easily with drugs. That makes you more vunerable to antibiotic-resistant infections and undermines the benefits of antibiotics for others.   Waste of Money: Antibiotics often aren't very expensive, but any money spent on unnecessary drugs is money down the drain.   When are antibiotics needed? Only when symptoms last longer than a week.  Start to improve but then worsen again  -It can take up to 2 weeks to feel better.   -If you do not get better in 7-10 days (Have fever, facial pain, dental pain and swelling), then please call the office and it is now appropriate to start an antibiotic.   -Please take Tylenol or Ibuprofen for pain. -Acetaminiphen 325mg orally every 4-6 hours for pain.  Max: 10 per day -Ibuprofen 200mg orally every 6-8 hours for pain.  Take with food to avoid ulcers.   Max 10 per  day  Please pick one of the over the counter allergy medications below and take it once daily for allergies.  Claritin or loratadine cheapest but likely the weakest  Zyrtec or certizine at night because it can make you sleepy The strongest is allegra or fexafinadine  Cheapest at [employer], [employer], [employer]  -While drinking fluids, pinch and hold nose close and swallow.  This will help open up your eustachian tubes to drain the fluid behind your ear drums. -Try steam showers to open your nasal passages.   Drink lots of water to stay hydrated and to thin mucous.  Flonase/Nasonex is to help the inflammation.  Take 2 sprays in each nostril at bedtime.  Make sure you spray towards the outside of each nostril towards the outer corner of your eye, hold nose close and tilt head back.  This will help the medication get into your sinuses.  If you do not like this medication, then use saline nasal sprays same directions as above for Flonase. Stop the medication right away if you get blurring of your vision or nose bleeds.  Sinusitis Sinusitis is redness, soreness, and inflammation of the paranasal sinuses. Paranasal sinuses are air pockets within the bones of your face (beneath the eyes, the middle of the forehead, or above the eyes). In healthy paranasal sinuses, mucus is able to drain out, and air is able to circulate through them by way of your nose. However, when your paranasal sinuses are inflamed, mucus and air can   become trapped. This can allow bacteria and other germs to grow and cause infection. Sinusitis can develop quickly and last only a short time (acute) or continue over a long period (chronic). Sinusitis that lasts for more than 12 weeks is considered chronic.  CAUSES  Causes of sinusitis include: Allergies. Structural abnormalities, such as displacement of the cartilage that separates your nostrils (deviated septum), which can decrease the air flow through your nose and sinuses and affect sinus  drainage. Functional abnormalities, such as when the small hairs (cilia) that line your sinuses and help remove mucus do not work properly or are not present. SIGNS AND SYMPTOMS  Symptoms of acute and chronic sinusitis are the same. The primary symptoms are pain and pressure around the affected sinuses. Other symptoms include: Upper toothache. Earache. Headache. Bad breath. Decreased sense of smell and taste. A cough, which worsens when you are lying flat. Fatigue. Fever. Thick drainage from your nose, which often is green and may contain pus (purulent). Swelling and warmth over the affected sinuses. DIAGNOSIS  Your health care provider will perform a physical exam. During the exam, your health care provider may: Look in your nose for signs of abnormal growths in your nostrils (nasal polyps).  Tap over the affected sinus to check for signs of infection. View the inside of your sinuses (endoscopy) using an imaging device that has a light attached (endoscope). If your health care provider suspects that you have chronic sinusitis, one or more of the following tests may be recommended: Allergy tests. Nasal culture. A sample of mucus is taken from your nose, sent to a lab, and screened for bacteria. Nasal cytology. A sample of mucus is taken from your nose and examined by your health care provider to determine if your sinusitis is related to an allergy. TREATMENT  Most cases of acute sinusitis are related to a viral infection and will resolve on their own within 10 days. Sometimes medicines are prescribed to help relieve symptoms (pain medicine, decongestants, nasal steroid sprays, or saline sprays).  However, for sinusitis related to a bacterial infection, your health care provider will prescribe antibiotic medicines. These are medicines that will help kill the bacteria causing the infection.  Rarely, sinusitis is caused by a fungal infection. In theses cases, your health care provider will  prescribe antifungal medicine. For some cases of chronic sinusitis, surgery is needed. Generally, these are cases in which sinusitis recurs more than 3 times per year, despite other treatments. HOME CARE INSTRUCTIONS  Drink plenty of water. Water helps thin the mucus so your sinuses can drain more easily. Use a humidifier. Inhale steam 3 to 4 times a day (for example, sit in the bathroom with the shower running). Apply a warm, moist washcloth to your face 3 to 4 times a day, or as directed by your health care provider. Use saline nasal sprays to help moisten and clean your sinuses. Take medicines only as directed by your health care provider. If you were prescribed either an antibiotic or antifungal medicine, finish it all even if you start to feel better. SEEK IMMEDIATE MEDICAL CARE IF: You have increasing pain or severe headaches. You have nausea, vomiting, or drowsiness. You have swelling around your face. You have vision problems. You have a stiff neck. You have difficulty breathing. MAKE SURE YOU:  Understand these instructions. Will watch your condition. Will get help right away if you are not doing well or get worse. Document Released: 11/25/2005 Document Revised: 04/11/2014 Document Reviewed: 12/10/2011 [employer]   Patient Information 2015 [employer]. This information is not intended to replace advice given to you by your health care provider. Make sure you discuss any questions you have with your health care provider.   

## 2016-01-23 NOTE — Progress Notes (Signed)
Subjective:    Patient ID: [name] [name], male    DOB: 07-12-1951, 65 y.o.   MRN: [id number]  HPI 65 y.o. WM presents with sinus infection x 10 days. Has sinus congestion, sinus pressure, now going into his chest with cough yellow mucus, has had chills and low grade temp over the weekend. Alk plus cold and sinus with mild relief.   Blood pressure 110/70, pulse 67, temperature 97.7 F (36.5 C), resp. rate 16, height 5' 7.75" (1.721 m), weight 160 lb 3.2 oz (72.666 kg), SpO2 97 %.  Past Medical History  Diagnosis Date  . Hypertension   . Hyperlipidemia   . GERD (gastroesophageal reflux disease)   . Vitamin D deficiency   . Arthritis     right thumb  . Dry eyes     Takes Restasis   Current Outpatient Prescriptions on File Prior to Visit  Medication Sig Dispense Refill  . amphetamine-dextroamphetamine (ADDERALL) 20 MG tablet Take 20 mg by mouth 2 (two) times daily.  0  . aspirin EC 325 MG tablet Take 1 tablet (325 mg total) by mouth daily. 30 tablet 0  . atenolol (TENORMIN) 100 MG tablet TAKE 1 BY MOUTH DAILY 90 tablet 1  . atorvastatin (LIPITOR) 80 MG tablet Take 1 tablet (80 mg total) by mouth daily. 30 tablet 3  . celecoxib (CELEBREX) 200 MG capsule TAKE 1 BY MOUTH DAILY 90 capsule 0  . Cholecalciferol (VITAMIN D PO) Take 5,000 Units by mouth daily.     . cycloSPORINE (RESTASIS) 0.05 % ophthalmic emulsion Place 1 drop into both eyes 2 (two) times daily. 72 mL 99  . fluconazole (DIFLUCAN) 150 MG tablet Take 1 tablet 2 x week  - Mon & Thurs for yeast infection 8 tablet 1  . gabapentin (NEURONTIN) 100 MG capsule Take 1 capsule (100 mg total) by mouth 3 (three) times daily. 90 capsule 0  . lisinopril-hydrochlorothiazide (PRINZIDE,ZESTORETIC) 20-25 MG per tablet TAKE 1 BY MOUTH DAILY 90 tablet 1  . LORazepam (ATIVAN) 1 MG tablet   0  . NEXIUM 40 MG capsule TAKE 1 BY MOUTH DAILY AT 12 NOON 90 capsule 0  . Omega-3 Fatty Acids (FISH OIL PO) Take 1,200 mg by mouth daily.     .  TURMERIC PO Take 1,500 mg by mouth daily.     No current facility-administered medications on file prior to visit.    Review of Systems  Constitutional: Positive for chills. Negative for fever and diaphoresis.  HENT: Positive for congestion, postnasal drip, rhinorrhea and sinus pressure. Negative for ear pain, sneezing, sore throat, trouble swallowing and voice change.   Eyes: Negative.   Respiratory: Positive for cough, shortness of breath and wheezing. Negative for chest tightness.   Cardiovascular: Negative.   Gastrointestinal: Negative.   Genitourinary: Negative.   Musculoskeletal: Positive for back pain. Negative for neck pain.  Neurological: Negative.  Negative for headaches.       Objective:   Physical Exam  Constitutional: He is oriented to person, place, and time. He appears well-developed and well-nourished.  HENT:  Head: Normocephalic and atraumatic.  Right Ear: Hearing and tympanic membrane normal.  Left Ear: Hearing and tympanic membrane normal.  Nose: Right sinus exhibits maxillary sinus tenderness. Left sinus exhibits maxillary sinus tenderness.  Mouth/Throat: Uvula is midline and mucous membranes are normal. Posterior oropharyngeal erythema present. No oropharyngeal exudate, posterior oropharyngeal edema or tonsillar abscesses.  Eyes: Conjunctivae are normal. Pupils are equal, round, and reactive to light.  Neck: Normal  range of motion. Neck supple.  Cardiovascular: Normal rate and regular rhythm.   Pulmonary/Chest: Effort normal. No respiratory distress. He has wheezes. He has no rales. He exhibits no tenderness.  Abdominal: Soft. Bowel sounds are normal.  Musculoskeletal: Normal range of motion.  Lymphadenopathy:    He has no cervical adenopathy.  Neurological: He is alert and oriented to person, place, and time.  Skin: Skin is warm and dry. No rash noted.       Assessment & Plan:  1. Acute maxillary sinusitis, recurrence not specified - azithromycin  (ZITHROMAX) 250 MG tablet; Take 2 tablets (500 mg) on  Day 1,  followed by 1 tablet (250 mg) once daily on Days 2 through 5.  Dispense: 6 each; Refill: 1 - predniSONE (DELTASONE) 20 MG tablet; 2 tablets daily for 3 days, 1 tablet daily for 4 days.  Dispense: 10 tablet; Refill: 0   2. Patient has follow up for 6 month Ov on Friday Requesting labs today so will have results to discuss with Dr. Melford Aase on Friday.

## 2016-01-24 LAB — LIPID PANEL
Cholesterol: 158 mg/dL (ref 125–200)
HDL: 47 mg/dL (ref >=40)
LDL Cholesterol: 85 mg/dL (ref <130)
Total CHOL/HDL Ratio: 3.4 Ratio (ref <=5.0)
Triglycerides: 129 mg/dL (ref <150)
VLDL: 26 mg/dL (ref <30)

## 2016-01-24 LAB — HEPATIC FUNCTION PANEL
ALT: 51 U/L — ABNORMAL HIGH (ref 9–46)
AST: 31 U/L (ref 10–35)
Albumin: 4.1 g/dL (ref 3.6–5.1)
Alkaline Phosphatase: 109 U/L (ref 40–115)
Bilirubin, Direct: 0.2 mg/dL (ref <=0.2)
Indirect Bilirubin: 0.6 mg/dL (ref 0.2–1.2)
Total Bilirubin: 0.8 mg/dL (ref 0.2–1.2)
Total Protein: 6.6 g/dL (ref 6.1–8.1)

## 2016-01-24 LAB — BASIC METABOLIC PANEL WITH GFR
BUN: 19 mg/dL (ref 7–25)
CO2: 32 mmol/L — ABNORMAL HIGH (ref 20–31)
Calcium: 9.6 mg/dL (ref 8.6–10.3)
Chloride: 94 mmol/L — ABNORMAL LOW (ref 98–110)
Creat: 1.09 mg/dL (ref 0.70–1.25)
GFR, Est African American: 83 mL/min (ref >=60)
GFR, Est Non African American: 72 mL/min (ref >=60)
Glucose, Bld: 90 mg/dL (ref 65–99)
Potassium: 4.4 mmol/L (ref 3.5–5.3)
Sodium: 134 mmol/L — ABNORMAL LOW (ref 135–146)

## 2016-01-24 LAB — INSULIN, FASTING: Insulin fasting, serum: 10.8 u[IU]/mL (ref 2.0–19.6)

## 2016-01-24 LAB — TSH: TSH: 1.41 mIU/L (ref 0.40–4.50)

## 2016-01-24 LAB — VITAMIN D 25 HYDROXY (VIT D DEFICIENCY, FRACTURES): Vit D, 25-Hydroxy: 83 ng/mL (ref 30–100)

## 2016-01-24 LAB — MAGNESIUM: Magnesium: 2.1 mg/dL (ref 1.5–2.5)

## 2016-01-24 LAB — URIC ACID: Uric Acid, Serum: 4.7 mg/dL (ref 4.0–7.8)

## 2016-01-25 ENCOUNTER — Encounter: Payer: Self-pay | Admitting: Physician Assistant

## 2016-01-26 ENCOUNTER — Ambulatory Visit (INDEPENDENT_AMBULATORY_CARE_PROVIDER_SITE_OTHER): Payer: BLUE CROSS/BLUE SHIELD | Admitting: Internal Medicine

## 2016-01-26 ENCOUNTER — Encounter: Payer: Self-pay | Admitting: Internal Medicine

## 2016-01-26 VITALS — BP 126/82 | HR 72 | Temp 97.7°F | Resp 16 | Ht 67.75 in | Wt 158.8 lb

## 2016-01-26 DIAGNOSIS — Z79899 Other long term (current) drug therapy: Secondary | ICD-10-CM

## 2016-01-26 DIAGNOSIS — E785 Hyperlipidemia, unspecified: Secondary | ICD-10-CM | POA: Diagnosis not present

## 2016-01-26 DIAGNOSIS — R7309 Other abnormal glucose: Secondary | ICD-10-CM | POA: Diagnosis not present

## 2016-01-26 DIAGNOSIS — M109 Gout, unspecified: Secondary | ICD-10-CM | POA: Insufficient documentation

## 2016-01-26 DIAGNOSIS — E559 Vitamin D deficiency, unspecified: Secondary | ICD-10-CM | POA: Diagnosis not present

## 2016-01-26 DIAGNOSIS — I10 Essential (primary) hypertension: Secondary | ICD-10-CM

## 2016-01-26 DIAGNOSIS — M1000 Idiopathic gout, unspecified site: Secondary | ICD-10-CM | POA: Diagnosis not present

## 2016-01-26 MED ORDER — ALLOPURINOL 300 MG PO TABS: ORAL_TABLET | ORAL | Status: DC

## 2016-01-26 NOTE — Progress Notes (Signed)
Patient ID: [name] [name], male   DOB: 26-Apr-1951, 65 y.o.   MRN: [id number]   This very nice 65 y.o. DWM presents for 6 month follow up with Hypertension, Hyperlipidemia, Pre-Diabetes and Vitamin D Deficiency. Patient also has been seeing Dr Ophelia Charter for back pain from DDD & Spinal Stenosis & has had EDSI's w/ benefit. Apparently he wad a sl elevated Uric acid and has been started on Allopurinol by  Dr Ophelia Charter in hopes of helping his back pain some. Labs were done at that visit and are as below.    Patient is treated for HTN since 1988 & BP has been controlled at home. Today's BP: 126/82 mmHg. Patient has had no complaints of any cardiac type chest pain, palpitations, dyspnea/orthopnea/PND, dizziness, claudication, or dependent edema. Patient was seen 3 days ago and treated with a ZPak & prednisone for sinusitis.    Hyperlipidemia is controlled with diet & meds. Patient denies myalgias or other med SE's. Last Lipids were Cholesterol at goal with Chol 158; HDL 47; LDL 85; Triglycerides 129 on 01/23/2016.    Also, the patient is monitored proactively for PreDiabetes and has had no symptoms of reactive hypoglycemia, diabetic polys, paresthesias or visual blurring.  Last A1c was  5.1% on 01/23/2016.   Further, the patient also has history of Vitamin D Deficiency of "21" in 2008  and supplements vitamin D without any suspected side-effects. Last vitamin D was 83 on 01/23/2016.  Medication Sig  . amphetamine-dextroamphetamine (ADDERALL) 20 MG tablet Take 20 mg by mouth 2 (two) times daily.  Marland Kitchen atenolol (TENORMIN) 100 MG tablet TAKE 1 BY MOUTH DAILY  . atorvastatin (LIPITOR) 80 MG tablet Take 1 tablet (80 mg total) by mouth daily.  Marland Kitchen azithromycin (ZITHROMAX) 250 MG tablet Take 2 tablets (500 mg) on  Day 1,  followed by 1 tablet (250 mg) once daily on Days 2 through 5.  . celecoxib (CELEBREX) 200 MG capsule TAKE 1 BY MOUTH DAILY  . Cholecalciferol (VITAMIN D PO) Take 5,000 Units by mouth daily.   . cycloSPORINE  (RESTASIS) 0.05 % ophthalmic emulsion Place 1 drop into both eyes 2 (two) times daily.  . fluconazole (DIFLUCAN) 150 MG tablet Take 1 tablet 2 x week  - Mon & Thurs for yeast infection  . lisinopril-hydrochlorothiazide (PRINZIDE,ZESTORETIC) 20-25 MG per tablet TAKE 1 BY MOUTH DAILY  . LORazepam (ATIVAN) 1 MG tablet   . NEXIUM 40 MG capsule TAKE 1 BY MOUTH DAILY AT 12 NOON  . Omega-3 Fatty Acids (FISH OIL PO) Take 1,200 mg by mouth daily.   . predniSONE (DELTASONE) 20 MG tablet 2 tablets daily for 3 days, 1 tablet daily for 4 days.  . TURMERIC PO Take 1,500 mg by mouth daily.  Marland Kitchen allopurinol (ZYLOPRIM) 100 MG tablet Take 100 mg by mouth daily.  Marland Kitchen aspirin EC 325 MG tablet Take 1 tablet (325 mg total) by mouth daily.  Marland Kitchen gabapentin (NEURONTIN) 100 MG capsule Take 1 capsule (100 mg total) by mouth 3 (three) times daily.   No facility-administered medications prior to visit.    No Known Allergies  PMHx:   Past Medical History  Diagnosis Date  . Hypertension   . Hyperlipidemia   . GERD (gastroesophageal reflux disease)   . Vitamin D deficiency   . Arthritis     right thumb  . Dry eyes     Takes Restasis   Immunization History  Administered Date(s) Administered  . Influenza Split 09/22/2013  . PPD Test 09/13/2014, 07/25/2015  .  Pneumococcal Polysaccharide-23 01/19/2009  . Tdap 01/19/2009  . Zoster 01/29/2013   Past Surgical History  Procedure Laterality Date  . Esophagogastroduodenoscopy  1990  . Colonoscopy w/ polypectomy    . 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 Eyes: Denies redness, blurred vision, diplopia, discharge, itchy, watery eyes.   ENT: Denies discharge, congestion, post nasal drip, epistaxis, sore throat, earache, hearing loss, dental pain, tinnitus, vertigo, sinus pain, snoring.  CV: Denies chest pain, palpitations, irregular heartbeat, syncope, dyspnea, diaphoresis, orthopnea, PND, claudication or edema. Respiratory: denies cough, dyspnea, DOE, pleurisy, hoarseness, laryngitis, wheezing.  Gastrointestinal: Denies dysphagia, odynophagia, heartburn, reflux, water brash, abdominal pain or cramps, nausea, vomiting, bloating, diarrhea, constipation, hematemesis, melena, hematochezia  or hemorrhoids. Genitourinary: Denies dysuria, frequency, urgency, nocturia, hesitancy, discharge, hematuria or flank pain. Musculoskeletal: Denies arthralgias, myalgias, stiffness, jt. swelling, pain, limping or strain/sprain.  Skin: Denies pruritus, rash, hives, warts, acne, eczema or change in skin lesion(s). Neuro: No weakness, tremor, incoordination, spasms, paresthesia or pain. Psychiatric: Denies confusion, memory loss or sensory loss. Endo: Denies change in weight, skin or hair change.  Heme/Lymph: No excessive bleeding, bruising or enlarged lymph nodes.  Physical Exam  BP 126/82 mmHg  Pulse 72  Temp(Src) 97.7 F (36.5 C)  Resp 16  Ht 5' 7.75" (1.721 m)  Wt 158 lb 12.8 oz (72.031 kg)  BMI 24.32 kg/m2  Appears well nourished and in no distress. Eyes: PERRLA, EOMs, conjunctiva no swelling or erythema. Sinuses: No frontal/maxillary tenderness ENT/Mouth: EAC's clear, TM's nl w/o erythema, bulging. Nares clear w/o erythema, swelling, exudates. Oropharynx clear without erythema or exudates. Oral hygiene is good. Tongue normal, non obstructing. Hearing intact.  Neck: Supple. Thyroid nl. Car 2+/2+ without bruits, nodes or JVD. Chest: Respirations nl with BS clear & equal w/o rales, rhonchi, wheezing or stridor.  Cor: Heart sounds normal w/ regular rate and rhythm without sig. murmurs, gallops, clicks, or rubs. Peripheral pulses normal  and equal  without edema.  Abdomen: Soft & bowel sounds normal. Non-tender w/o guarding, rebound, hernias, masses, or organomegaly.  Lymphatics: Unremarkable.  Musculoskeletal: Full ROM all peripheral extremities, joint stability, 5/5 strength, and normal gait.  Skin: Warm, dry without exposed rashes, lesions or ecchymosis apparent.  Neuro: Cranial nerves intact, reflexes equal bilaterally. Sensory-motor testing grossly intact. Tendon reflexes grossly intact.  Pysch: Alert & oriented x 3.  Insight and judgement nl & appropriate. No ideations.  Assessment and Plan:  1. Essential hypertension   2. Hyperlipidemia   3. Blood glucose abnormal   4. Vitamin D deficiency   5. Idiopathic gout, unspecified chronicity, unspecified site  - allopurinol (ZYLOPRIM) 300 MG tablet; Take 1 tablet daily for Gout  Dispense: 90 tablet; Refill: 1  6. Medication management   Recommended regular exercise, BP monitoring, weight control, and discussed med and SE's. Recommended labs to assess and monitor clinical status. Further disposition pending results of labs. Over 30 minutes of exam, counseling, chart review was performed

## 2016-01-26 NOTE — Patient Instructions (Signed)

## 2016-02-05 ENCOUNTER — Encounter: Payer: Self-pay | Admitting: Internal Medicine

## 2016-02-05 ENCOUNTER — Ambulatory Visit (INDEPENDENT_AMBULATORY_CARE_PROVIDER_SITE_OTHER): Payer: BLUE CROSS/BLUE SHIELD | Admitting: Internal Medicine

## 2016-02-05 VITALS — BP 124/66 | HR 64 | Temp 98.2°F | Resp 16 | Ht 67.5 in | Wt 159.0 lb

## 2016-02-05 DIAGNOSIS — M545 Low back pain, unspecified: Secondary | ICD-10-CM

## 2016-02-05 MED ORDER — LORAZEPAM 1 MG PO TABS: 1.0000 mg | ORAL_TABLET | Freq: Two times a day (BID) | ORAL | Status: DC

## 2016-02-05 MED ORDER — LIDO-CAPSAICIN-MEN-METHYL SAL 0.5-0.035-5-20 % EX PTCH: 1.0000 | MEDICATED_PATCH | Freq: Every day | CUTANEOUS | Status: DC

## 2016-02-05 MED ORDER — KETOROLAC TROMETHAMINE 30 MG/ML IJ SOLN
60.0000 mg | Freq: Once | INTRAMUSCULAR | Status: AC
  Administered 2016-02-05: 60 mg via INTRAMUSCULAR

## 2016-02-05 MED ORDER — PREDNISONE 20 MG PO TABS: ORAL_TABLET | ORAL | Status: DC

## 2016-02-05 NOTE — Progress Notes (Signed)
Subjective:    Patient ID: [name] [name], male    DOB: 1951-01-19, 65 y.o.   MRN: [id number]  Back Pain Pertinent negatives include no abdominal pain, dysuria or fever.  Fall Pertinent negatives include no abdominal pain, fever, hematuria, nausea or vomiting.   Patient presents to the office for evaluation of left sided back pain after a fall on 01/27/16.  He reports that he was on a ladder and fell on the left side of the lower back. He reports that he did see Dr. Ophelia Charter and her reports that they did xrays which were normal.  He reports that he has continued to have a lot more pain but he feels like he cannot walk very well.  He reports that he hasn't had much relief from the celebrex and he reports that he hasn't had much relief from hydrocodone 5 mg/325 mg.  He reports that he stopped taking it because "it doesn't help".  He states that he still has some.  He has also tried aleve.  He reports that he wants a second opinion.  He hasn't had any blood in his urine.  No bruising to the surface of the skin.   He can't see any swelling very well due to location.  He has had issues with lower back pain in the past.  He reports that he has tried gabapentin and that didn't help.  He does take allopurinol daily.  He felt like he was doing better.  He also reports that he has been taking some muscle relaxers given to him by a coworker.     Review of Systems  Constitutional: Negative for fever and chills.  Gastrointestinal: Negative for nausea, vomiting, abdominal pain, diarrhea and constipation.  Genitourinary: Negative for dysuria, urgency, frequency, hematuria and difficulty urinating.  Musculoskeletal: Positive for back pain and gait problem.       Objective:   Physical Exam  Constitutional: He is oriented to person, place, and time. He appears well-developed and well-nourished. No distress.  HENT:  Head: Normocephalic.  Mouth/Throat: Oropharynx is clear and moist. No oropharyngeal exudate.    Eyes: Conjunctivae are normal. No scleral icterus.  Neck: Normal range of motion. Neck supple. No JVD present. No thyromegaly present.  Cardiovascular: Normal rate, regular rhythm, normal heart sounds and intact distal pulses.  Exam reveals no gallop and no friction rub.   No murmur heard. Pulmonary/Chest: Effort normal and breath sounds normal. No respiratory distress. He has no wheezes. He has no rales. He exhibits no tenderness.  Abdominal: Soft. Normal appearance and bowel sounds are normal. He exhibits no distension and no mass. There is no tenderness. There is no rebound and no guarding.  Musculoskeletal: Normal range of motion. He exhibits tenderness.       Back:  Patient rises slowly from sitting to standing.  They walk without an antalgic gait.  There is no evidence of erythema, ecchymosis, or gross deformity.  There is tenderness to palpation over left lumbar paraspinal muscles.  Active ROM is full but painful especially with lateral bending and twisting.  Sensation to light touch is intact over all extremities.  Strength is symmetric and equal in all extremities.  Negative straight left leg raise.     Lymphadenopathy:    He has no cervical adenopathy.  Neurological: He is alert and oriented to person, place, and time.  Skin: Skin is warm and dry. He is not diaphoretic.  Psychiatric: He has a normal mood and affect. His behavior is  normal. Judgment and thought content normal.  Nursing note and vitals reviewed.   Filed Vitals:   02/05/16 1134  BP: 124/66  Pulse: 64  Temp: 98.2 F (36.8 C)  Resp: 16          Assessment & Plan:    1. Left-sided low back pain without sciatica -toradol given today - predniSONE (DELTASONE) 20 MG tablet; 3 tabs po daily x 3 days, then 2 tabs x 3 days, then 1.5 tabs x 3 days, then 1 tab x 3 days, then 0.5 tabs x 3 days  Dispense: 27 tablet; Refill: 0 - Lido-Capsaicin-Men-Methyl Sal 0.5-0.035-5-20 % PTCH; Apply 1 patch topically daily.   Dispense: 30 patch; Refill: 2 - LORazepam (ATIVAN) 1 MG tablet; Take 1 tablet (1 mg total) by mouth 2 (two) times daily.  Dispense: 60 tablet; Refill: 0 - Ambulatory referral to Physical Therapy

## 2016-02-05 NOTE — Addendum Note (Signed)
Addended by: Laakea Pereira A on: 02/05/2016 01:02 PM   Modules accepted: Orders

## 2016-02-06 ENCOUNTER — Other Ambulatory Visit: Payer: Self-pay | Admitting: *Deleted

## 2016-02-06 DIAGNOSIS — E782 Mixed hyperlipidemia: Secondary | ICD-10-CM

## 2016-02-06 MED ORDER — CELECOXIB 200 MG PO CAPS: ORAL_CAPSULE | ORAL | Status: DC

## 2016-02-07 ENCOUNTER — Ambulatory Visit: Payer: Self-pay | Admitting: Physician Assistant

## 2016-02-15 ENCOUNTER — Other Ambulatory Visit: Payer: Self-pay | Admitting: Internal Medicine

## 2016-02-15 ENCOUNTER — Encounter: Payer: Self-pay | Admitting: Internal Medicine

## 2016-02-15 DIAGNOSIS — M545 Low back pain, unspecified: Secondary | ICD-10-CM

## 2016-02-23 ENCOUNTER — Ambulatory Visit
Admission: RE | Admit: 2016-02-23 | Discharge: 2016-02-23 | Disposition: A | Discharge status: Home / Self Care | Payer: BLUE CROSS/BLUE SHIELD | Source: Ambulatory Visit | Attending: Internal Medicine | Admitting: Internal Medicine

## 2016-02-23 ENCOUNTER — Encounter: Payer: Self-pay | Admitting: Internal Medicine

## 2016-02-23 ENCOUNTER — Ambulatory Visit (INDEPENDENT_AMBULATORY_CARE_PROVIDER_SITE_OTHER): Payer: BLUE CROSS/BLUE SHIELD | Admitting: Internal Medicine

## 2016-02-23 VITALS — BP 122/70 | HR 62 | Temp 98.2°F | Resp 16 | Ht 67.75 in | Wt 157.0 lb

## 2016-02-23 DIAGNOSIS — M5442 Lumbago with sciatica, left side: Secondary | ICD-10-CM

## 2016-02-23 DIAGNOSIS — M545 Low back pain, unspecified: Secondary | ICD-10-CM

## 2016-02-23 MED ORDER — PREGABALIN 75 MG PO CAPS: 75.0000 mg | ORAL_CAPSULE | Freq: Two times a day (BID) | ORAL | Status: DC

## 2016-02-23 MED ORDER — PREDNISONE 20 MG PO TABS: ORAL_TABLET | ORAL | Status: DC

## 2016-02-23 MED ORDER — ZALEPLON 5 MG PO CAPS: 5.0000 mg | ORAL_CAPSULE | Freq: Every evening | ORAL | Status: DC | PRN

## 2016-02-23 NOTE — Progress Notes (Signed)
Assessment and Plan:   1. Left-sided low back pain with left-sided sciatica -MRI today -try lyrica -understand reservations from Dr. Ophelia CharterYates but given patient must be out of town and doesn't feel he can function without prednisone will do 1 more 2 week taper with no refills. -did recommend continued PT   HPI 64 y.o.male presents for 1 month follow up of left lower back pain after a fall several months ago.  He was seen by me and placed on prednisone taper, was given lidocaine patches, and referred to physical therapy.  He reports that he did go to see his Chiropractor.  He went in and got more xrays.  He has also seen Dr. Ophelia CharterYates since our last visit and Dr. Ophelia CharterYates feels like this is secondary to a decreased disc height.  He is also due to have an MRI this afternoon at 3:10 pm. Patient reports that they have had continued pain in his lower back and left hip.  male did take his medication.  They did not have difficulty with their medications.  They had no adverse reactions.  He has had continued pain and is worried because he is due to travel soon.  He would like to have more prednisone as he felt like that is helping.  He reports that he did try gabapentin and he didn't like the way that he felt on it.  He did try it at nighttime.  He felt no relief with it.      Past Medical History  Diagnosis Date  . Hypertension   . Hyperlipidemia   . GERD (gastroesophageal reflux disease)   . Vitamin D deficiency   . Arthritis     right thumb  . Dry eyes     Takes Restasis     No Known Allergies    Current Outpatient Prescriptions on File Prior to Visit  Medication Sig Dispense Refill  . allopurinol (ZYLOPRIM) 300 MG tablet Take 1 tablet daily for Gout 90 tablet 1  . amphetamine-dextroamphetamine (ADDERALL) 20 MG tablet Take 20 mg by mouth 2 (two) times daily.  0  . aspirin 81 MG tablet Take 81 mg by mouth daily.    Marland Kitchen. atenolol (TENORMIN) 100 MG tablet TAKE 1 BY MOUTH DAILY 90 tablet 1  .  atorvastatin (LIPITOR) 80 MG tablet Take 1 tablet (80 mg total) by mouth daily. 30 tablet 3  . celecoxib (CELEBREX) 200 MG capsule TAKE 1 CAPSULE BY MOUTH DAILY 90 capsule 1  . Cholecalciferol (VITAMIN D PO) Take 5,000 Units by mouth daily.     . cycloSPORINE (RESTASIS) 0.05 % ophthalmic emulsion Place 1 drop into both eyes 2 (two) times daily. 72 mL 99  . fluconazole (DIFLUCAN) 150 MG tablet Take 1 tablet 2 x week  - Mon & Thurs for yeast infection 8 tablet 1  . HYDROcodone-acetaminophen (NORCO/VICODIN) 5-325 MG tablet Take 1 tablet by mouth every 6 (six) hours as needed for moderate pain.    . Lido-Capsaicin-Men-Methyl Sal 0.5-0.035-5-20 % PTCH Apply 1 patch topically daily. 30 patch 2  . lisinopril-hydrochlorothiazide (PRINZIDE,ZESTORETIC) 20-25 MG per tablet TAKE 1 BY MOUTH DAILY 90 tablet 1  . LORazepam (ATIVAN) 1 MG tablet Take 1 tablet (1 mg total) by mouth 2 (two) times daily. 60 tablet 0  . NEXIUM 40 MG capsule TAKE 1 BY MOUTH DAILY AT 12 NOON 90 capsule 0  . Omega-3 Fatty Acids (FISH OIL PO) Take 1,200 mg by mouth daily.     . predniSONE (DELTASONE) 20 MG  tablet 3 tabs po daily x 3 days, then 2 tabs x 3 days, then 1.5 tabs x 3 days, then 1 tab x 3 days, then 0.5 tabs x 3 days 27 tablet 0  . TURMERIC PO Take 1,500 mg by mouth daily.     No current facility-administered medications on file prior to visit.    ROS: all negative except above.   Physical Exam: There were no vitals filed for this visit. There were no vitals taken for this visit. General Appearance: Well developed well nourished, non-toxic appearing in no apparent distress. Eyes: PERRLA, EOMs, conjunctiva w/ no swelling or erythema or discharge Sinuses: No Frontal/maxillary tenderness ENT/Mouth: Ear canals clear without swelling or erythema.  TM's normal bilaterally with no retractions, bulging, or loss of landmarks.   Neck: Supple, thyroid normal, no notable JVD  Respiratory: Respiratory effort normal, Clear breath  sounds anteriorly and posteriorly bilaterally without rales, rhonchi, wheezing or stridor. No retractions or accessory muscle usage. Cardio: RRR with no MRGs.   Abdomen: Soft, + BS.  Non tender, no guarding, rebound, hernias, masses.  Musculoskeletal: Full ROM, 5/5 strength, normal gait.  Skin: Warm, dry without rashes  Neuro: Awake and oriented X 3, Cranial nerves intact. Normal muscle tone, no cerebellar symptoms. Sensation intact.  Psych: normal affect, Insight and Judgment appropriate.     Terri Piedra, PA-C 9:56 AM [facility]

## 2016-02-26 ENCOUNTER — Other Ambulatory Visit: Payer: BLUE CROSS/BLUE SHIELD

## 2016-04-08 ENCOUNTER — Other Ambulatory Visit: Payer: Self-pay | Admitting: Internal Medicine

## 2016-04-17 ENCOUNTER — Ambulatory Visit (INDEPENDENT_AMBULATORY_CARE_PROVIDER_SITE_OTHER): Payer: BLUE CROSS/BLUE SHIELD | Admitting: Internal Medicine

## 2016-04-17 ENCOUNTER — Other Ambulatory Visit: Payer: Self-pay | Admitting: *Deleted

## 2016-04-17 ENCOUNTER — Encounter: Payer: Self-pay | Admitting: Internal Medicine

## 2016-04-17 VITALS — BP 124/82 | HR 56 | Temp 97.3°F | Resp 16 | Ht 67.75 in | Wt 155.4 lb

## 2016-04-17 DIAGNOSIS — M545 Low back pain, unspecified: Secondary | ICD-10-CM

## 2016-04-17 MED ORDER — DULOXETINE HCL 60 MG PO CPEP: ORAL_CAPSULE | ORAL | Status: DC

## 2016-04-17 MED ORDER — PREDNISONE 20 MG PO TABS: ORAL_TABLET | ORAL | Status: DC

## 2016-04-17 MED ORDER — PREDNISONE 20 MG PO TABS: ORAL_TABLET | ORAL | Status: AC

## 2016-04-17 NOTE — Progress Notes (Signed)
Subjective:    Patient ID: [name] [name], male    DOB: [birth year], 65 y.o.   MRN: [id number]  HPI   This 65 yo DWM relates falling off of a ladder from a 4 ft hight 3 months ago and apparently sustaining a small comp  fx of L1 vertebra and having persistent pains in his L buttock & hip areas esp limiting and exacerbating his pains. Feels that as the day progresses that his posture becomes more stooped and the discomfort becomes very limiting in precluding performance of his usual daily work activities.  Medication Sig  . allopurinol (ZYLOPRIM) 300 MG tablet Take 1 tablet daily for Gout  . ADDERALL 20 MG tablet Take 20 mg by mouth 2 (two) times daily.  Marland Kitchen aspirin 81 MG tablet Take 81 mg by mouth daily.  Marland Kitchen atenolol  100 MG tablet TAKE 1 BY MOUTH DAILY  . atorvastatin  80 MG tablet Take 1 tablet (80 mg total) by mouth daily.  . celecoxib  200 MG capsule TAKE 1 CAPSULE BY MOUTH DAILY  . VITAMIN D Take 5,000 Units by mouth daily.   . NORCO 5-325 MG tablet Take 1 tablet by mouth every 6 (six) hours as needed for moderate pain.  Marland Kitchen lisinopril-hctz 20-25 MG tablet TAKE 1 BY MOUTH DAILY  . LORazepam (ATIVAN) 1 MG tablet Take 1 tablet (1 mg total) by mouth 2 (two) times daily.  Marland Kitchen NEXIUM 40 MG capsule TAKE 1 BY MOUTH DAILY AT 12 NOON  . Omega-3 FISH OIL Take 1,200 mg by mouth daily.   Marland Kitchen LYRICA 75 MG capsule Take 1 capsule (75 mg total) by mouth 2 (two) times daily.  . TURMERIC Take 1,500 mg by mouth daily.  . zaleplon (SONATA) 5 MG capsule Take 1 capsule (5 mg total) by mouth at bedtime as needed for sleep.   No Known Allergies   Past Medical History  Diagnosis Date  . Hypertension   . Hyperlipidemia   . GERD (gastroesophageal reflux disease)   . Vitamin D deficiency   . Arthritis     right thumb  . Dry eyes     Takes Restasis   Past Surgical History  Procedure Laterality Date  . Esophagogastroduodenoscopy  1990  . Colonoscopy w/ polypectomy    . 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    Objective:   Physical Exam  BP 124/82 mmHg  Pulse 56  Temp(Src) 97.3 F (36.3 C)  Resp 16  Ht 5' 7.75" (1.721 m)  Wt 155 lb 6.4 oz (70.489 kg)  BMI 23.80 kg/m2  In no distress.   HEENT - Eac's patent. TM's Nl. EOM's full. PERRLA. NasoOroPharynx clear. Neck - supple. Nl Thyroid. Carotids 2+ & No bruits, nodes, JVD Chest - Clear equal BS w/o Rales, rhonchi, wheezes. Cor - Nl HS. RRR w/o sig MGR. PP 1(+). No edema. MS- FROM w/o deformities. Muscle power, tone and bulk Nl. Gait sl limping favoring the the Left. Sl tender L buittock and L hip trochanteroca Neuro - No obvious Cr N abnormalities. DTRs in LE are flat /1+ . Sensory, motor and Cerebellar functions appear Nl w/o focal abnormalities.    Assessment & Plan:   1. Left-sided low back pain without sciatica  - Rx Cymbalta 60 mg #30  To take for chronic pain - Rx Prednisore 20 mg  #33 - To use very sparingly at 1/2 to 1 tab on a qod basis - ROV 3-4 weeks to reassess

## 2016-04-24 ENCOUNTER — Other Ambulatory Visit: Payer: Self-pay

## 2016-04-30 ENCOUNTER — Ambulatory Visit: Payer: Self-pay | Admitting: Internal Medicine

## 2016-05-15 ENCOUNTER — Other Ambulatory Visit: Payer: Self-pay | Admitting: Internal Medicine

## 2016-05-23 ENCOUNTER — Ambulatory Visit: Payer: BLUE CROSS/BLUE SHIELD | Attending: Orthopaedic Surgery | Admitting: Physical Therapy

## 2016-05-23 DIAGNOSIS — M5442 Lumbago with sciatica, left side: Secondary | ICD-10-CM

## 2016-05-23 DIAGNOSIS — M6281 Muscle weakness (generalized): Secondary | ICD-10-CM | POA: Insufficient documentation

## 2016-05-23 DIAGNOSIS — R293 Abnormal posture: Secondary | ICD-10-CM | POA: Insufficient documentation

## 2016-05-23 DIAGNOSIS — R262 Difficulty in walking, not elsewhere classified: Secondary | ICD-10-CM

## 2016-05-23 DIAGNOSIS — M25552 Pain in left hip: Secondary | ICD-10-CM | POA: Insufficient documentation

## 2016-05-24 ENCOUNTER — Encounter: Payer: Self-pay | Admitting: Internal Medicine

## 2016-05-24 ENCOUNTER — Ambulatory Visit (INDEPENDENT_AMBULATORY_CARE_PROVIDER_SITE_OTHER): Payer: BLUE CROSS/BLUE SHIELD | Admitting: Internal Medicine

## 2016-05-24 VITALS — BP 130/76 | HR 64 | Temp 97.7°F | Resp 16 | Ht 67.75 in | Wt 153.2 lb

## 2016-05-24 DIAGNOSIS — E559 Vitamin D deficiency, unspecified: Secondary | ICD-10-CM

## 2016-05-24 DIAGNOSIS — M545 Low back pain, unspecified: Secondary | ICD-10-CM

## 2016-05-24 DIAGNOSIS — M1000 Idiopathic gout, unspecified site: Secondary | ICD-10-CM

## 2016-05-24 DIAGNOSIS — Z79899 Other long term (current) drug therapy: Secondary | ICD-10-CM | POA: Diagnosis not present

## 2016-05-24 DIAGNOSIS — I10 Essential (primary) hypertension: Secondary | ICD-10-CM | POA: Diagnosis not present

## 2016-05-24 DIAGNOSIS — R7309 Other abnormal glucose: Secondary | ICD-10-CM

## 2016-05-24 DIAGNOSIS — E785 Hyperlipidemia, unspecified: Secondary | ICD-10-CM | POA: Diagnosis not present

## 2016-05-24 LAB — BASIC METABOLIC PANEL WITH GFR
BUN: 15 mg/dL (ref 7–25)
CO2: 28 mmol/L (ref 20–31)
Calcium: 9.5 mg/dL (ref 8.6–10.3)
Chloride: 97 mmol/L — ABNORMAL LOW (ref 98–110)
Creat: 1 mg/dL (ref 0.70–1.25)
GFR, Est African American: >89 mL/min (ref >=60)
GFR, Est Non African American: 80 mL/min (ref >=60)
Glucose, Bld: 106 mg/dL — ABNORMAL HIGH (ref 65–99)
Potassium: 4 mmol/L (ref 3.5–5.3)
Sodium: 135 mmol/L (ref 135–146)

## 2016-05-24 LAB — CBC WITH DIFFERENTIAL/PLATELET
Basophils Absolute: 0 cells/uL (ref 0–200)
Basophils Relative: 0 %
Eosinophils Absolute: 0 cells/uL — ABNORMAL LOW (ref 15–500)
Eosinophils Relative: 0 %
HCT: 46.6 % (ref 38.5–50.0)
Hemoglobin: 16.1 g/dL (ref 13.2–17.1)
Lymphocytes Relative: 8 %
Lymphs Abs: 672 cells/uL — ABNORMAL LOW (ref 850–3900)
MCH: 34.3 pg — ABNORMAL HIGH (ref 27.0–33.0)
MCHC: 34.5 g/dL (ref 32.0–36.0)
MCV: 99.4 fL (ref 80.0–100.0)
MPV: 9.5 fL (ref 7.5–12.5)
Monocytes Absolute: 336 cells/uL (ref 200–950)
Monocytes Relative: 4 %
Neutro Abs: 7392 cells/uL (ref 1500–7800)
Neutrophils Relative %: 88 %
Platelets: 200 10*3/uL (ref 140–400)
RBC: 4.69 MIL/uL (ref 4.20–5.80)
RDW: 13 % (ref 11.0–15.0)
WBC: 8.4 10*3/uL (ref 3.8–10.8)

## 2016-05-24 LAB — HEPATIC FUNCTION PANEL
ALT: 26 U/L (ref 9–46)
AST: 25 U/L (ref 10–35)
Albumin: 4.4 g/dL (ref 3.6–5.1)
Alkaline Phosphatase: 43 U/L (ref 40–115)
Bilirubin, Direct: 0.2 mg/dL (ref <=0.2)
Indirect Bilirubin: 1.1 mg/dL (ref 0.2–1.2)
Total Bilirubin: 1.3 mg/dL — ABNORMAL HIGH (ref 0.2–1.2)
Total Protein: 6.6 g/dL (ref 6.1–8.1)

## 2016-05-24 LAB — LIPID PANEL
Cholesterol: 200 mg/dL (ref 125–200)
HDL: 71 mg/dL (ref >=40)
LDL Cholesterol: 93 mg/dL (ref <130)
Total CHOL/HDL Ratio: 2.8 Ratio (ref <=5.0)
Triglycerides: 182 mg/dL — ABNORMAL HIGH (ref <150)
VLDL: 36 mg/dL — ABNORMAL HIGH (ref <30)

## 2016-05-24 LAB — TSH: TSH: 0.87 mIU/L (ref 0.40–4.50)

## 2016-05-24 LAB — MAGNESIUM: Magnesium: 2.1 mg/dL (ref 1.5–2.5)

## 2016-05-24 LAB — URIC ACID: Uric Acid, Serum: 4.3 mg/dL (ref 4.0–8.0)

## 2016-05-24 NOTE — Progress Notes (Signed)
Patient ID: [name] [name], male   DOB: [birth year], 65 y.o.   MRN: [id number]  [facility]                       Lucky Cowboy, M.D.        Dyanne Carrel. Steffanie Dunn, P.A.-C       Terri Piedra, P.A.-C   [facility]                [address]                [place], [place]. [address] Telephone [phone] Telefax [phone] _________________________________________________________________________     This very nice 65 y.o. DWM presents for  follow up with Hypertension, Hyperlipidemia, Pre-Diabetes and Vitamin D Deficiency. Patient also has hx/o L THR for AVN (2015)  and is also followed by Dr Ophelia Charter for DDD/Spinal Stenosis and has had EDSI. Recently since Feb 2017, he fell off a ladder an sustained a small L1 Compression Fx with c/o constant pain at the LB area and L hip/Buttock area and a question of L sciatica. He rates his pains from 5-6 up to 7-8 thru-out the day. He relates his physical activities are moderately restricted.      Patient is treated for labile HTN predating originally since 27 & BP has been controlled at home. Today's BP: 130/76 mmHg. Patient has had no complaints of any cardiac type chest pain, palpitations, dyspnea/orthopnea/PND, dizziness, claudication, or dependent edema.     Hyperlipidemia is controlled with diet & meds. Patient denies myalgias or other med SE's. Last Lipids were at goal with Cholesterol 158; HDL 47; LDL 85; Triglycerides 129 on 01/23/2016.     Also, the patient has history of PreDiabetes and has had no symptoms of reactive hypoglycemia, diabetic polys, paresthesias or visual blurring.  Last A1c was  5.1% on  01/23/2016.     Further, the patient also has history of Vitamin D Deficiency of "28" in 2008 and supplements vitamin D without any suspected side-effects. Last vitamin D was 83 on 01/23/2016.   Medication Sig  . allopurinol 300 MG tablet Take 1 tablet daily for Gout  .  ADDERALL 20 MG tablet Take 10 mg by mouth as needed.   Marland Kitchen aspirin 81 MG tablet Take 81 mg by mouth daily.  Marland Kitchen atenolol  100 MG tablet TAKE 1 BY MOUTH DAILY  . atorvastatin  80 MG tablet Take 1 tablet (80 mg total) by mouth daily.  . celecoxib  200 MG capsule TAKE 1 CAPSULE BY MOUTH DAILY  . VITAMIN D Take 5,000 Units by mouth daily.   . VFIEP3-295 MG  Take 1 tablet by mouth every 6 (six) hours as needed for moderate pain.  Marland Kitchen lisinopril-hctz 20-25 MG tablet TAKE 1 BY MOUTH DAILY  . Omega-3 FISH OIL  Take 1,200 mg by mouth daily.   . TURMERIC PO Take 1,500 mg by mouth daily.   No Known Allergies  PMHx:   Past Medical History  Diagnosis Date  . Hypertension   . Hyperlipidemia   . GERD (gastroesophageal reflux disease)   . Vitamin D deficiency   . Arthritis     right thumb  . Dry eyes     Takes Restasis   Immunization History  Administered Date(s) Administered  . Influenza Split 09/22/2013  . PPD Test 09/13/2014, 07/25/2015  . Pneumococcal Polysaccharide-23 01/19/2009  . Tdap 01/19/2009  . Zoster 01/29/2013  Past Surgical History  Procedure Laterality Date  . Esophagogastroduodenoscopy  1990  . Colonoscopy w/ polypectomy    . 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 Eyes: Denies redness, blurred vision, diplopia, discharge, itchy, watery eyes.  ENT: Denies discharge, congestion, post nasal drip, epistaxis, sore throat, earache, hearing loss, dental pain, tinnitus, vertigo, sinus pain, snoring.  CV: Denies chest pain, palpitations, irregular heartbeat, syncope, dyspnea, diaphoresis, orthopnea, PND, claudication or edema. Respiratory: denies  cough, dyspnea, DOE, pleurisy, hoarseness, laryngitis, wheezing.  Gastrointestinal: Denies dysphagia, odynophagia, heartburn, reflux, water brash, abdominal pain or cramps, nausea, vomiting, bloating, diarrhea, constipation, hematemesis, melena, hematochezia  or hemorrhoids. Genitourinary: Denies dysuria, frequency, urgency, nocturia, hesitancy, discharge, hematuria or flank pain. Musculoskeletal: Denies arthralgias, myalgias, stiffness, jt. swelling, pain, limping or strain/sprain.  Skin: Denies pruritus, rash, hives, warts, acne, eczema or change in skin lesion(s). Neuro: No weakness, tremor, incoordination, spasms, paresthesia or pain. Psychiatric: Denies confusion, memory loss or sensory loss. Endo: Denies change in weight, skin or hair change.  Heme/Lymph: No excessive bleeding, bruising or enlarged lymph nodes.  Physical Exam  BP 130/76 mmHg  Pulse 64  Temp(Src) 97.7 F (36.5 C)  Resp 16  Ht 5' 7.75" (1.721 m)  Wt 153 lb 3.2 oz (69.491 kg)  BMI 23.46 kg/m2  Appears well nourished and in no distress.  Eyes: PERRLA, EOMs, conjunctiva no swelling or erythema. Sinuses: No frontal/maxillary tenderness ENT/Mouth: EAC's clear, TM's nl w/o erythema, bulging. Nares clear w/o erythema, swelling, exudates. Oropharynx clear without erythema or exudates. Oral hygiene is good. Tongue normal, non obstructing. Hearing intact.  Neck: Supple. Thyroid nl. Car 2+/2+ without bruits, nodes or JVD. Chest: Respirations nl with BS clear & equal w/o rales, rhonchi, wheezing or stridor.  Cor: Heart sounds normal w/ regular rate and rhythm without sig. murmurs, gallops, clicks, or rubs. Peripheral pulses normal and equal  without edema.  Abdomen: Soft & bowel sounds normal. Non-tender w/o guarding, rebound, hernias, masses, or organomegaly.  Lymphatics: Unremarkable.  Musculoskeletal: Full ROM all peripheral extremities, joint stability, 5/5 strength, and normal gait.  Skin: Warm, dry without exposed  rashes, lesions or ecchymosis apparent.  Neuro: Cranial nerves intact, reflexes equal bilaterally. Sensory-motor testing grossly intact. Tendon reflexes grossly intact.  Pysch: Alert & oriented x 3.  Insight and judgement nl & appropriate. No ideations.  Assessment and Plan:  1. Essential hypertension  - TSH  2. Hyperlipidemia  - Lipid panel - TSH  3. Blood glucose abnormal  - Hemoglobin A1c - Insulin, random  4. Vitamin D deficiency  - VITAMIN D 25 Hydroxy  5. Idiopathic gout, unspecified chronicity  - Uric acid  6. Left-sided low back pain without sciatica   7. Medication management  - CBC with Differential/Platelet - BASIC METABOLIC PANEL WITH GFR - Hepatic function panel - Magnesium   Recommended regular exercise, BP monitoring, weight control, and discussed med and SE's. Recommended labs to assess and monitor clinical status. Further disposition pending results of labs. Over 30 minutes of exam, counseling, chart review was performed

## 2016-05-24 NOTE — Patient Instructions (Signed)

## 2016-05-24 NOTE — Therapy (Signed)
[facility] Rehabilitation [facility] [address] [place], [place], [address] Phone: [phone]   Fax:  [phone]  Physical Therapy Evaluation  Patient Details  Name: [name] [name] MRN: [id number] Date of Birth: [birth year] Referring Provider: Dr. Annell Greening  Encounter Date: 05/23/2016      PT End of Session - 05/24/16 0617    Visit Number 1   Number of Visits 16   Date for PT Re-Evaluation 07/19/16   PT Start Time 1330   PT Stop Time 1430   PT Time Calculation (min) 60 min   Activity Tolerance Patient tolerated treatment well   Behavior During Therapy [facility] for tasks assessed/performed      Past Medical History  Diagnosis Date  . Hypertension   . Hyperlipidemia   . GERD (gastroesophageal reflux disease)   . Vitamin D deficiency   . Arthritis     right thumb  . Dry eyes     Takes Restasis    Past Surgical History  Procedure Laterality Date  . Esophagogastroduodenoscopy  1990  . Colonoscopy w/ polypectomy    . Total hip arthroplasty Left 10/28/2014    Procedure: TOTAL HIP ARTHROPLASTY ANTERIOR APPROACH;  Surgeon: Eldred Manges, MD;  Location: [facility] OR;  Service: Orthopedics;  Laterality: Left;    There were no vitals filed for this visit.       Subjective Assessment - 05/23/16 1321    Subjective Pt fell off a ladder Feb 2017  (4 feet) and suffered L1 compression fracture. Previously treated for Rt. sided sciatica which began after his hip surgery.  Pt states he was being treated for back pain but it turned out to be due to hip issue (avascular necrosis) and ultimately needed a hip replacement. Pain is now in low lumbar spine and across to L hip/glutes, sensory disturbance.   He changes his gait to avoid pain, lists to Rt. and forward.  He denies weakness or red flags.      Pertinent History THR 2015 Anterior approach, L: post surgical onset of Rt. scaitica.  Multiple rounds of steroids.     Limitations  Sitting;Lifting;Standing;Walking;House hold activities   How long can you sit comfortably? has to have back support   How long can you stand comfortably? better in the AM, end of the day a few minutes.     How long can you walk comfortably? again, not very comfortable at all, flexed    Diagnostic tests MRI 02/2016.  Showed L1 compression Fx (severe) and L5-S1 Central disc protrusion. Mild bilateral facet arthropathy.   Patient Stated Goals I want to be back the way I was. Exercise.     Currently in Pain? Yes  none at rest    Pain Score 7    Pain Location Hip   Pain Orientation Left;Posterior;Proximal;Lateral   Pain Descriptors / Indicators Burning;Pins and needles;Numbness   Pain Type Chronic pain   Pain Radiating Towards Prox L hip , not below knee   Pain Onset More than a month ago   Pain Frequency Intermittent   Aggravating Factors  standing, walking    Pain Relieving Factors sitting   Effect of Pain on Daily Activities changes gait, unable to walk normally for working             [facility] PT Assessment - 05/23/16 1321    Assessment   Medical Diagnosis L1 compression fx   Referring Provider Dr. Annell Greening   Onset Date/Surgical Date 01/20/16   Next MD  Visit Unknown   Prior Therapy Yes for Rt LE sciatica   Precautions   Precautions None   Restrictions   Weight Bearing Restrictions No   Balance Screen   Has the patient fallen in the past 6 months Yes   How many times? 1   Has the patient had a decrease in activity level because of a fear of falling?  Yes  due to pain    Is the patient reluctant to leave their home because of a fear of falling?  No   Home [employer]ist information centre manager residence   Prior Function   Level of Independence Independent   Vocation Full time employment   Vocation Requirements walking, travelling   Leisure outdoors, exercise   Cognition   Overall Cognitive Status Within Functional Limits for tasks assessed   Observation/Other  Assessments   Focus on Therapeutic Outcomes (FOTO)  55%  goal 38   Sensation   Light Touch Not tested   Additional Comments reports numbness and tingling, in prox. L hip    Coordination   Gross Motor Movements are Fluid and Coordinated Not tested   Posture/Postural Control   Posture/Postural Control Postural limitations   Postural Limitations Rounded Shoulders;Forward head;Decreased lumbar lordosis;Flexed trunk;Weight shift right   AROM   Lumbar Flexion 80   Lumbar Extension 0   Lumbar - Right Side Bend 10   Lumbar - Left Side Bend 20   Lumbar - Right Rotation WFL pain    Lumbar - Left Rotation WFL no pain    Strength   Right Hip Flexion 5/5   Right Hip Extension 4/5   Right Hip ABduction 4+/5   Left Hip Flexion 4/5   Left Hip Extension 4/5   Left Hip ABduction 4+/5   Right Knee Flexion 5/5   Right Knee Extension 5/5   Left Knee Flexion 5/5   Left Knee Extension 5/5   Flexibility   Hamstrings 50-55 deg    Palpation   Spinal mobility NT due to compression    Palpation comment palpable L1 fracture prominence to Rt. side of spine, min tenderness in gluteals and lateral hip mm   Special Tests   Lumbar Tests Prone Knee Bend Test;Straight Leg Raise   Prone Knee Bend Test   Findings Negative   Comment bilat.    Straight Leg Raise   Findings Negative   Comment bilat.             [facility] Adult PT Treatment/Exercise - 05/23/16 1435    Self-Care   Self-Care Heat/Ice Application;Other Self-Care Comments   Other Self-Care Comments  Eval findings, anatomy, HEP, PT and POC    Cryotherapy   Number Minutes Cryotherapy 15 Minutes   Cryotherapy Location Lumbar Spine;Hip   Type of Cryotherapy Ice pack   Electrical Stimulation   Electrical Stimulation Location hip L    Electrical Stimulation Action IFC   Electrical Stimulation Parameters 20   Electrical Stimulation Goals Pain                PT Education - 05/24/16 [phone]    Education provided Yes   Education Details  PT/POC, anatomy, flexion bias, HEP and modalities   Person(s) Educated Patient   Methods Explanation;Handout;Verbal cues   Comprehension Verbalized understanding;Need further instruction          PT Short Term Goals - 05/24/16 0720    PT SHORT TERM GOAL #1   Title Pt will be I with initial HEP for trunk  flexibility/hipand core strength    Time 4   Period Weeks   Status New   PT SHORT TERM GOAL #2   Title Pt will be able to sit for work/travel and report only min stiffness/pain after 30 min.    Time 4   Period Weeks   Status New   PT SHORT TERM GOAL #3   Title Pt will be able to walk with min difficulty/compensation for pain in LLE each afternoon (improved 50%)   Time 4   Period Weeks   Status New   PT SHORT TERM GOAL #4   Title Pt will be able to do yardwork with 50% less difficulty, rest breaks.    Time 4   Period Weeks   Status New           PT Long Term Goals - 05/24/16 1610    PT LONG TERM GOAL #1   Title Pt will score 39% or less impaired on FOTO to demo improvement in function.    Time 8   Period Weeks   Status New   PT LONG TERM GOAL #2   Title Pt will return to gym for exercise with no increase in pain afterwards.    Time 8   Period Weeks   Status New   PT LONG TERM GOAL #3   Title Pt will report pain and sensory disturbance in L hip,back as min and intermittent in the afternoon.    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    Time 8   Period Weeks   Status New               Plan -  05/24/16 0617    Clinical Impression Statement Patient presents today with mod complexity eval (evolving symptoms) following hip and back injuries.  Pain at site of compression fracture is minimal.  His pain seems reflective of a lumbosacral radiculopathy affecting sensory  nerves.  Weightbearing, walking, standing on LLE aggravates pain and he develops an abnormal gait as a result.  Pt will benefit from PT to reduce nerve inflammation, improve core strength and restore function to maintain ability to work and retunr to fitness program.    Rehab Potential Good   PT Frequency 2x / week   PT Duration 8 weeks   PT Treatment/Interventions Neuromuscular re-education;Electrical Stimulation;Moist Heat;Manual techniques;Therapeutic activities;Functional mobility training;Dry needling;Patient/family education;Cryotherapy   PT Next Visit Plan check HEP, ask if hip XR done after fall, begin core/lower ab strength as able.  Modalities if helpful   PT Home Exercise Plan hamstring, piriformis, TrA isometric, KTC    Consulted and Agree with Plan of Care Patient      Patient will benefit from skilled therapeutic intervention in order to improve the following deficits and impairments:  Difficulty walking, Increased fascial restricitons, Decreased range  of motion, Pain, Hypomobility, Impaired flexibility, Impaired sensation, Decreased strength, Decreased mobility, Postural dysfunction, Abnormal gait  Visit Diagnosis: Pain in left hip  Abnormal posture  Left-sided low back pain with left-sided sciatica  Muscle weakness (generalized)  Difficulty in walking, not elsewhere classified     Problem List Patient Active Problem List   Diagnosis Date Noted  . Gout 01/26/2016  . Insomnia 12/19/2014  . 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 Left hip pain 10/11/2014  . Insulin resistance 09/13/2014  . Medication management 09/12/2014  . Encounter for long-term (current) use of medications  03/27/2014  . Blood glucose abnormal 03/27/2014  . Hypertension   . Hyperlipidemia   . GERD (gastroesophageal reflux disease)   . Vitamin D deficiency     Danisha Brassfield 05/24/2016, 8:47 AM  [facility] [address] [place], [place], [address] Phone: [phone]   Fax:  [phone]  Name: Clint GuyRandell Z Rohwer MRN: [id number] Date of Birth: 05/07/1951   Karie MainlandJennifer Roena Sassaman, PT 05/24/2016 8:47 AM Phone: [phone] Fax: [phone]

## 2016-05-25 LAB — HEMOGLOBIN A1C
Hgb A1c MFr Bld: 5.1 % (ref <5.7)
Mean Plasma Glucose: 100 mg/dL

## 2016-05-25 LAB — VITAMIN D 25 HYDROXY (VIT D DEFICIENCY, FRACTURES): Vit D, 25-Hydroxy: 68 ng/mL (ref 30–100)

## 2016-05-27 LAB — INSULIN, RANDOM: Insulin: 12.2 u[IU]/mL (ref 2.0–19.6)

## 2016-05-30 ENCOUNTER — Ambulatory Visit: Payer: BLUE CROSS/BLUE SHIELD | Admitting: Physical Therapy

## 2016-05-30 DIAGNOSIS — M5442 Lumbago with sciatica, left side: Secondary | ICD-10-CM

## 2016-05-30 DIAGNOSIS — M25552 Pain in left hip: Secondary | ICD-10-CM

## 2016-05-30 DIAGNOSIS — M6281 Muscle weakness (generalized): Secondary | ICD-10-CM

## 2016-05-30 DIAGNOSIS — R262 Difficulty in walking, not elsewhere classified: Secondary | ICD-10-CM

## 2016-05-30 DIAGNOSIS — R293 Abnormal posture: Secondary | ICD-10-CM

## 2016-05-30 NOTE — Therapy (Signed)
[facility] [address] [place], [place], [address] Phone: [phone]   Fax:  [phone]  Physical Therapy Treatment  Patient Details  Name: [name] GuyRandell Z [name] MRN: [id number] Date of Birth: 02/22/1951 Referring Provider: Dr. Annell GreeningMark Yates  Encounter Date: 05/30/2016      PT End of Session - 05/30/16 1548    Visit Number 2   Number of Visits 16   Date for PT Re-Evaluation 07/19/16   PT Start Time 0346   PT Stop Time 0445   PT Time Calculation (min) 59 min      Past Medical History  Diagnosis Date  . Hypertension   . Hyperlipidemia   . GERD (gastroesophageal reflux disease)   . Vitamin D deficiency   . Arthritis     right thumb  . Dry eyes     Takes Restasis    Past Surgical History  Procedure Laterality Date  . Esophagogastroduodenoscopy  1990  . Colonoscopy w/ polypectomy    . 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      Subjective Assessment - 05/30/16 1548    Subjective I dont think the doctor knows what he is talking about. He has done nothing for the pain. The estim helped until I got home.    Currently in Pain? Yes   Pain Score 9                          [facility] Adult PT Treatment/Exercise - 05/30/16 0001    Lumbar Exercises: Stretches   Active Hamstring Stretch 3 reps;30 seconds   Single Knee to Chest Stretch 30 seconds;3 reps   Piriformis Stretch 3 reps;30 seconds   Lumbar Exercises: Supine   Ab Set 5 reps   Heel Slides 10 reps   Bent Knee Raise 10 reps   Straight Leg Raise 10 reps   Other Supine Lumbar Exercises unable to bring both knees up in hooklying increased right LBP   Cryotherapy   Number Minutes Cryotherapy 15 Minutes   Cryotherapy Location Lumbar Spine   Type of Cryotherapy Ice  pack   Electrical Stimulation   Electrical Stimulation Location hip L   Electrical Stimulation Action IFC   Electrical Stimulation Parameters 18.5   Electrical Stimulation Goals Pain                PT Education - 05/30/16 1648    Education provided Yes   Education Details Abdominal brace with march, leg extensions, and SLR   Person(s) Educated Patient   Methods Explanation;Handout   Comprehension Verbalized understanding          PT Short Term Goals - 05/24/16 0720    PT SHORT TERM GOAL #1   Title Pt will be I with initial HEP for trunk flexibility/hipand core strength    Time 4   Period Weeks   Status New   PT SHORT TERM GOAL #2   Title Pt will be able to sit for work/travel and report only min stiffness/pain after 30 min.    Time 4   Period Weeks   Status New   PT SHORT TERM GOAL #3   Title Pt will be able to walk with min difficulty/compensation for pain in LLE each afternoon (improved 50%)  Time 4   Period Weeks   Status New   PT SHORT TERM GOAL #4   Title Pt will be able to do yardwork with 50% less difficulty, rest breaks.    Time 4   Period Weeks   Status New           PT Long Term Goals - 05/24/16 1610    PT LONG TERM GOAL #1   Title Pt will score 39% or less impaired on FOTO to demo improvement in function.    Time 8   Period Weeks   Status New   PT LONG TERM GOAL #2   Title Pt will return to gym for exercise with no increase in pain afterwards.    Time 8   Period Weeks   Status New   PT LONG TERM GOAL #3   Title Pt will report pain and sensory disturbance in L hip,back as min and intermittent in the afternoon.    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    Time 8   Period Weeks   Status New               Plan - 05/30/16 1607    Clinical Impression Statement Pt reports x rays done on hip and LB after fall. They missed the fracture on the first xray. The fracture was found by his Chiropractor. Pt is frustrated about his situation and feels more should be done about his pain. He has no pain lying down however 9/10 pain  today with walking/standing. Pt requires cues for HEP stretches. He demonstrated supine holding legs 6 inches from table as a core strength exercise. He was advised to discontinue that exercie and was instructed in neutral spine core exercies without increased LBP for HEP. IFC helful last visit however he could not feel the ice. Repeated IFC and placed ice under clothing. WIll assess benefit of ice next visit.    PT Next Visit Plan reeview  core/lower ab strength, continue as able.  IFC      Patient will benefit from skilled therapeutic intervention in order to improve the following deficits and impairments:  Difficulty walking, Increased fascial restricitons, Decreased range of motion, Pain, Hypomobility, Impaired flexibility, Impaired sensation, Decreased strength, Decreased mobility, Postural dysfunction, Abnormal gait  Visit Diagnosis: Pain in left hip  Abnormal posture  Left-sided low back pain with left-sided sciatica  Muscle weakness (generalized)  Difficulty in walking, not elsewhere classified     Problem List Patient Active Problem List   Diagnosis Date Noted  . Gout 01/26/2016  . Insomnia 12/19/2014  . 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 Left hip pain 10/11/2014  . Insulin resistance 09/13/2014  . Medication management 09/12/2014  . Encounter for long-term (current) use of medications 03/27/2014   .  Blood glucose abnormal 03/27/2014  . Hypertension   . Hyperlipidemia   . GERD (gastroesophageal reflux disease)   . Vitamin D deficiency     Sherrie MustacheDonoho, Lexii Walsh McGee, [place] 05/30/2016, 4:59 PM  [facility] [address] [place], [place], [address] Phone: [phone]   Fax:  [phone]  Name: [name] GuyRandell Z Petrella MRN: [id number] Date of Birth: 03/09/1951

## 2016-06-03 ENCOUNTER — Ambulatory Visit: Payer: BLUE CROSS/BLUE SHIELD | Admitting: Physical Therapy

## 2016-06-03 DIAGNOSIS — R293 Abnormal posture: Secondary | ICD-10-CM

## 2016-06-03 DIAGNOSIS — M25552 Pain in left hip: Secondary | ICD-10-CM

## 2016-06-03 DIAGNOSIS — M5442 Lumbago with sciatica, left side: Secondary | ICD-10-CM

## 2016-06-03 DIAGNOSIS — M6281 Muscle weakness (generalized): Secondary | ICD-10-CM

## 2016-06-03 DIAGNOSIS — R262 Difficulty in walking, not elsewhere classified: Secondary | ICD-10-CM

## 2016-06-03 NOTE — Therapy (Signed)
[facility] [address] [place], [place], [address] Phone: [phone]   Fax:  [phone]  Physical Therapy Treatment  Patient Details  Name: [name] GuyRandell Z [name] MRN: [id number] Date of Birth: 02/13/1951 Referring Provider: Dr. Annell GreeningMark Yates  Encounter Date: 06/03/2016      PT End of Session - 06/03/16 1332    Visit Number 3   Number of Visits 16   Date for PT Re-Evaluation 07/19/16   PT Start Time 0130   PT Stop Time 0230   PT Time Calculation (min) 60 min      Past Medical History  Diagnosis Date  . Hypertension   . Hyperlipidemia   . GERD (gastroesophageal reflux disease)   . Vitamin D deficiency   . Arthritis     right thumb  . Dry eyes     Takes Restasis    Past Surgical History  Procedure Laterality Date  . Esophagogastroduodenoscopy  1990  . Colonoscopy w/ polypectomy    . 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      Subjective Assessment - 06/03/16 1332    Subjective I am the same   Currently in Pain? Yes   Pain Score 6   up to 8/10 by end of day   Aggravating Factors  standing , walking   Pain Relieving Factors sitting, supine                         [facility] Adult PT Treatment/Exercise - 06/03/16 0001    Lumbar Exercises: Stretches   Prone on Elbows Stretch 1 rep;60 seconds  x2   Lumbar Exercises: Supine   Ab Set 5 reps   AB Set Limitations with physioballl   Bent Knee Raise 10 reps   Straight Leg Raise 10 reps   Other Supine Lumbar Exercises able to bring legs into table top today and alternate knee raises x10, decompression position x 5 minutes, also decompression leg lengthener and press down x 5 each bilateral   Other Supine Lumbar Exercises isometric obliques x 10 alternating with  physioball   Cryotherapy   Number Minutes Cryotherapy --   Cryotherapy Location --   Type of Cryotherapy --   Electrical Stimulation   Electrical Stimulation Location Hip L    Electrical Stimulation Action IFC x 15   Electrical Stimulation Parameters 21   Electrical Stimulation Goals Pain   Manual Therapy   Manual therapy comments Long axis distraction left leg - no change                PT Education - 06/03/16 1427    Education provided Yes   Education Details prone on elbows, decompression position   Person(s) Educated Patient   Methods Explanation;Handout   Comprehension Verbalized understanding          PT Short Term Goals - 05/24/16 0720    PT SHORT TERM GOAL #1   Title Pt will be I with initial HEP for trunk flexibility/hipand core strength    Time 4   Period Weeks   Status New   PT SHORT TERM GOAL #2   Title Pt will be able to sit for work/travel and report only min stiffness/pain after 30 min.    Time 4   Period  Weeks   Status New   PT SHORT TERM GOAL #3   Title Pt will be able to walk with min difficulty/compensation for pain in LLE each afternoon (improved 50%)   Time 4   Period Weeks   Status New   PT SHORT TERM GOAL #4   Title Pt will be able to do yardwork with 50% less difficulty, rest breaks.    Time 4   Period Weeks   Status New           PT Long Term Goals - 05/24/16 [id number]    PT LONG TERM GOAL #1   Title Pt will score 39% or less impaired on FOTO to demo improvement in function.    Time 8   Period Weeks   Status New   PT LONG TERM GOAL #2   Title Pt will return to gym for exercise with no increase in pain afterwards.    Time 8   Period Weeks   Status New   PT LONG TERM GOAL #3   Title Pt will report pain and sensory disturbance in L hip,back as min and intermittent in the afternoon.    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    Time 8   Period Weeks   Status New               Plan - 06/03/16 1435    Clinical Impression Statement Pt reports he is having a better day today and pain is not as high now but can still rise to 8-9/10 with standing and walking. We worked on prone lying with a pillow up to prone on elbows with a pillow. Pt able to toelrate for 2 minutes. Pt shown decompression position iwth legs elevated in chair. Reviewed pts core HEP and he also demonstrated improved ability to come into table top position without increased pain,    PT Next Visit Plan reeview  core/lower ab strength, Prone on elbows as toelrated , continue as able.  IFC, traction added to POC?       Patient will benefit from skilled therapeutic intervention in order to improve the following deficits and impairments:  Difficulty walking, Increased fascial restricitons, Decreased range of motion, Pain, Hypomobility, Impaired flexibility, Impaired sensation, Decreased strength, Decreased mobility, Postural dysfunction, Abnormal gait  Visit Diagnosis: Pain in left hip  Abnormal posture  Left-sided low back pain with left-sided sciatica  Muscle weakness (generalized)  Difficulty in walking, not elsewhere classified     Problem List Patient Active Problem List   Diagnosis Date Noted  . Gout 01/26/2016  . Insomnia 12/19/2014  . 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 Left hip pain 10/11/2014  . Insulin resistance 09/13/2014  . Medication management 09/12/2014  . Encounter for long-term (current) use of medications 03/27/2014  .  Blood glucose  abnormal 03/27/2014  . Hypertension   . Hyperlipidemia   . GERD (gastroesophageal reflux disease)   . Vitamin D deficiency     [name] [name] , [place]  06/03/2016, 2:44 PM  [facility] [address] [place], [place], [address] Phone: [phone]   Fax:  [phone]  Name: WYLIE COON MRN: [id number] Date of Birth: 02/01/1951

## 2016-06-05 ENCOUNTER — Encounter: Payer: Self-pay | Admitting: Orthopaedic Surgery

## 2016-06-06 ENCOUNTER — Ambulatory Visit: Payer: BLUE CROSS/BLUE SHIELD | Admitting: Physical Therapy

## 2016-06-06 DIAGNOSIS — M6281 Muscle weakness (generalized): Secondary | ICD-10-CM

## 2016-06-06 DIAGNOSIS — R293 Abnormal posture: Secondary | ICD-10-CM

## 2016-06-06 DIAGNOSIS — M5442 Lumbago with sciatica, left side: Secondary | ICD-10-CM

## 2016-06-06 DIAGNOSIS — M25552 Pain in left hip: Secondary | ICD-10-CM

## 2016-06-06 DIAGNOSIS — R262 Difficulty in walking, not elsewhere classified: Secondary | ICD-10-CM

## 2016-06-06 NOTE — Therapy (Addendum)
[place], [place], [address] Phone: [phone]   Fax:  [phone]  Physical Therapy Treatment/Discharge  Patient Details  Name: [name] [name] MRN: [id number] Date of Birth: 19-Jun-1951 Referring Provider: Dr. Rodell Perna  Encounter Date: 06/06/2016      PT End of Session - 06/06/16 1109    Visit Number 4   Number of Visits 16   Date for PT Re-Evaluation 07/19/16   PT Start Time 1100   PT Stop Time 1145   PT Time Calculation (min) 45 min      Past Medical History  Diagnosis Date  . Hypertension   . Hyperlipidemia   . GERD (gastroesophageal reflux disease)   . Vitamin D deficiency   . Arthritis     right thumb  . Dry eyes     Takes Restasis    Past Surgical History  Procedure Laterality Date  . Esophagogastroduodenoscopy  1990  . Colonoscopy w/ polypectomy    . Total hip arthroplasty Left 10/28/2014    Procedure: TOTAL HIP ARTHROPLASTY ANTERIOR APPROACH;  Surgeon: Marybelle Killings, MD;  Location: [facility];  Service: Orthopedics;  Laterality: Left;    There were no vitals filed for this visit.                       [facility] PT Treatment/Exercise - 06/06/16 0001    Lumbar Exercises: Stretches   Active Hamstring Stretch 3 reps;30 seconds   Single Knee to Chest Stretch 30 seconds;3 reps   Piriformis Stretch 3 reps;30 seconds   Piriformis Stretch Limitations knee to opposite shoulder and figure four 3 x 30 each   Lumbar Exercises: Supine   Bent Knee Raise 10 reps   Bent Knee Raise Limitations level 2    Straight Leg Raise 10 reps   Other Supine Lumbar Exercises isometric obliques x 10 alternating with physioball, then ab sets with ball x 10   Modalities   Modalities Traction   Traction   Type of Traction Lumbar   Min (lbs) 70   Max (lbs) 45   Hold Time 60   Rest Time 20   Time 15                  PT Short Term Goals - 06/06/16 1450    PT SHORT TERM  GOAL #1   Title Pt will be I with initial HEP for trunk flexibility/hipand core strength    Time 4   Period Weeks   Status On-going   PT SHORT TERM GOAL #2   Title Pt will be able to sit for work/travel and report only min stiffness/pain after 30 min.    Time 4   Period Weeks   Status On-going   PT SHORT TERM GOAL #3   Title Pt will be able to walk with min difficulty/compensation for pain in LLE each afternoon (improved 50%)   Time 4   Period Weeks   Status On-going   PT SHORT TERM GOAL #4   Title Pt will be able to do yardwork with 50% less difficulty, rest breaks.    Time 4   Period Weeks   Status On-going           PT Long Term Goals - 05/24/16 0723    PT LONG TERM GOAL #1   Title Pt will score 39% or less impaired on FOTO to demo improvement in function.    Time 8  Period Weeks   Status New   PT LONG TERM GOAL #2   Title Pt will return to gym for exercise with no increase in pain afterwards.    Time 8   Period Weeks   Status New   PT LONG TERM GOAL #3   Title Pt will report pain and sensory disturbance in L hip,back as min and intermittent in the afternoon.    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    Time 8   Period Weeks   Status New               Plan - 06/06/16 1443    Clinical Impression Statement Pt reports he has been referred to MD for Vertebroplasty consult. He is anxious to have the procedure due to his high levels of pain. Reviewed pt's HEP stretches and continued core strength  without pain increase. He is not doing his stretches at home, only the core strengthening. Trial of lumbar traction today. Will assess benefit next visit. Progressing toward STG#1.    PT Next Visit Plan asess benefit of traction and adjust as needed, core, flexibility      Patient will benefit from skilled therapeutic intervention in order to improve the following deficits and impairments:  Difficulty walking, Increased fascial restricitons, Decreased range of motion, Pain, Hypomobility, Impaired flexibility, Impaired sensation, Decreased strength, Decreased mobility, Postural dysfunction, Abnormal gait  Visit Diagnosis: Pain in left hip  Abnormal posture  Left-sided low back pain with left-sided sciatica  Muscle weakness (generalized)  Difficulty in walking, not elsewhere classified     Problem List Patient Active Problem List   Diagnosis Date Noted  . Gout 01/26/2016  . Insomnia 12/19/2014  . Avascular necrosis of left femoral head ([place]) 10/28/2014  . Left hip pain 10/11/2014  . Insulin resistance 09/13/2014  . Medication management 09/12/2014  . Encounter for long-term (current) use of medications 03/27/2014  . Blood glucose abnormal 03/27/2014  . Hypertension   . Hyperlipidemia   . GERD (gastroesophageal reflux disease)   . Vitamin D deficiency     Dorene Ar, [place] 06/06/2016, 2:59 PM  [facility] [address] [place], [place], [address] Phone: [phone]   Fax:  [phone]  Name: [name] [name] MRN: [id number] Date of Birth: 10-30-1951    PHYSICAL THERAPY DISCHARGE SUMMARY  Visits from Start of Care: 4  Current functional level related to goals / functional outcomes: See above for most recent info   Remaining deficits: Unknown, see above    Education / Equipment: HEP, posture Plan: Patient agrees to discharge.  Patient goals were not met. Patient is being discharged due to the  physician's request.   Pt called to cancel appts due to having surgery mid July.  Did not see details in Epic.  Raeford Razor, PT 07/02/16 3:37 PM Phone: [phone] Fax: [phone]  ?????

## 2016-06-13 ENCOUNTER — Telehealth (HOSPITAL_COMMUNITY): Payer: Self-pay

## 2016-06-13 NOTE — Telephone Encounter (Signed)
Called to schedule consult with Dr. Corliss Skainseveshwar, left vm for pt to return call. AW

## 2016-06-17 ENCOUNTER — Encounter: Payer: BLUE CROSS/BLUE SHIELD | Admitting: Physical Therapy

## 2016-06-17 ENCOUNTER — Other Ambulatory Visit (HOSPITAL_COMMUNITY): Payer: Self-pay | Admitting: Interventional Radiology

## 2016-06-17 DIAGNOSIS — IMO0002 Reserved for concepts with insufficient information to code with codable children: Secondary | ICD-10-CM

## 2016-06-17 DIAGNOSIS — M545 Low back pain: Secondary | ICD-10-CM

## 2016-06-20 ENCOUNTER — Encounter: Payer: BLUE CROSS/BLUE SHIELD | Admitting: Physical Therapy

## 2016-06-21 ENCOUNTER — Ambulatory Visit (HOSPITAL_COMMUNITY): Admission: RE | Admit: 2016-06-21 | Payer: Self-pay | Source: Ambulatory Visit

## 2016-06-21 ENCOUNTER — Ambulatory Visit (HOSPITAL_COMMUNITY)
Admission: RE | Admit: 2016-06-21 | Discharge: 2016-06-21 | Disposition: A | Discharge status: Home / Self Care | Payer: BLUE CROSS/BLUE SHIELD | Source: Ambulatory Visit | Attending: Interventional Radiology | Admitting: Interventional Radiology

## 2016-06-21 ENCOUNTER — Other Ambulatory Visit (HOSPITAL_COMMUNITY): Payer: Self-pay | Admitting: Interventional Radiology

## 2016-06-21 DIAGNOSIS — IMO0002 Reserved for concepts with insufficient information to code with codable children: Secondary | ICD-10-CM

## 2016-06-21 DIAGNOSIS — M545 Low back pain: Secondary | ICD-10-CM

## 2016-06-25 ENCOUNTER — Encounter: Payer: BLUE CROSS/BLUE SHIELD | Admitting: Physical Therapy

## 2016-06-27 ENCOUNTER — Encounter: Payer: BLUE CROSS/BLUE SHIELD | Admitting: Physical Therapy

## 2016-07-02 ENCOUNTER — Encounter: Payer: BLUE CROSS/BLUE SHIELD | Admitting: Physical Therapy

## 2016-07-04 ENCOUNTER — Encounter: Payer: BLUE CROSS/BLUE SHIELD | Admitting: Physical Therapy

## 2016-07-17 ENCOUNTER — Other Ambulatory Visit: Payer: Self-pay | Admitting: Internal Medicine

## 2016-07-19 ENCOUNTER — Other Ambulatory Visit: Payer: Self-pay | Admitting: Internal Medicine

## 2016-07-19 MED ORDER — CELECOXIB 200 MG PO CAPS: ORAL_CAPSULE | ORAL | 3 refills | Status: DC

## 2016-07-31 ENCOUNTER — Other Ambulatory Visit: Payer: Self-pay | Admitting: Neurological Surgery

## 2016-07-31 DIAGNOSIS — S32001S Stable burst fracture of unspecified lumbar vertebra, sequela: Secondary | ICD-10-CM

## 2016-08-02 ENCOUNTER — Ambulatory Visit
Admission: RE | Admit: 2016-08-02 | Discharge: 2016-08-02 | Disposition: A | Discharge status: Home / Self Care | Payer: BLUE CROSS/BLUE SHIELD | Source: Ambulatory Visit | Attending: Neurological Surgery | Admitting: Neurological Surgery

## 2016-08-02 DIAGNOSIS — S32001S Stable burst fracture of unspecified lumbar vertebra, sequela: Secondary | ICD-10-CM

## 2016-08-08 ENCOUNTER — Ambulatory Visit (INDEPENDENT_AMBULATORY_CARE_PROVIDER_SITE_OTHER): Payer: BLUE CROSS/BLUE SHIELD | Admitting: Internal Medicine

## 2016-08-08 ENCOUNTER — Other Ambulatory Visit: Payer: Self-pay | Admitting: *Deleted

## 2016-08-08 ENCOUNTER — Encounter: Payer: Self-pay | Admitting: Internal Medicine

## 2016-08-08 VITALS — BP 122/80 | HR 60 | Temp 97.0°F | Resp 16 | Ht 66.5 in | Wt 153.6 lb

## 2016-08-08 DIAGNOSIS — E559 Vitamin D deficiency, unspecified: Secondary | ICD-10-CM | POA: Diagnosis not present

## 2016-08-08 DIAGNOSIS — Z79899 Other long term (current) drug therapy: Secondary | ICD-10-CM | POA: Diagnosis not present

## 2016-08-08 DIAGNOSIS — Z1212 Encounter for screening for malignant neoplasm of rectum: Secondary | ICD-10-CM

## 2016-08-08 DIAGNOSIS — Z136 Encounter for screening for cardiovascular disorders: Secondary | ICD-10-CM

## 2016-08-08 DIAGNOSIS — E785 Hyperlipidemia, unspecified: Secondary | ICD-10-CM

## 2016-08-08 DIAGNOSIS — Z111 Encounter for screening for respiratory tuberculosis: Secondary | ICD-10-CM | POA: Diagnosis not present

## 2016-08-08 DIAGNOSIS — Z0001 Encounter for general adult medical examination with abnormal findings: Secondary | ICD-10-CM

## 2016-08-08 DIAGNOSIS — Z1211 Encounter for screening for malignant neoplasm of colon: Secondary | ICD-10-CM

## 2016-08-08 DIAGNOSIS — M545 Low back pain, unspecified: Secondary | ICD-10-CM

## 2016-08-08 DIAGNOSIS — Z125 Encounter for screening for malignant neoplasm of prostate: Secondary | ICD-10-CM

## 2016-08-08 DIAGNOSIS — R7309 Other abnormal glucose: Secondary | ICD-10-CM

## 2016-08-08 DIAGNOSIS — M5137 Other intervertebral disc degeneration, lumbosacral region: Secondary | ICD-10-CM

## 2016-08-08 DIAGNOSIS — I10 Essential (primary) hypertension: Secondary | ICD-10-CM

## 2016-08-08 DIAGNOSIS — K219 Gastro-esophageal reflux disease without esophagitis: Secondary | ICD-10-CM

## 2016-08-08 DIAGNOSIS — E8881 Metabolic syndrome: Secondary | ICD-10-CM

## 2016-08-08 DIAGNOSIS — Z0000 Encounter for general adult medical examination without abnormal findings: Secondary | ICD-10-CM

## 2016-08-08 DIAGNOSIS — R5383 Other fatigue: Secondary | ICD-10-CM

## 2016-08-08 DIAGNOSIS — E88819 Insulin resistance, unspecified: Secondary | ICD-10-CM

## 2016-08-08 DIAGNOSIS — M1000 Idiopathic gout, unspecified site: Secondary | ICD-10-CM

## 2016-08-08 LAB — IRON AND TIBC
%SAT: 59 % (ref 15–60)
Iron: 179 ug/dL (ref 50–180)
TIBC: 305 ug/dL (ref 250–425)
UIBC: 126 ug/dL (ref 125–400)

## 2016-08-08 LAB — HEPATIC FUNCTION PANEL
ALT: 22 U/L (ref 9–46)
AST: 18 U/L (ref 10–35)
Albumin: 4.3 g/dL (ref 3.6–5.1)
Alkaline Phosphatase: 42 U/L (ref 40–115)
Bilirubin, Direct: 0.2 mg/dL (ref <=0.2)
Indirect Bilirubin: 0.9 mg/dL (ref 0.2–1.2)
Total Bilirubin: 1.1 mg/dL (ref 0.2–1.2)
Total Protein: 6.6 g/dL (ref 6.1–8.1)

## 2016-08-08 LAB — CBC WITH DIFFERENTIAL/PLATELET
Basophils Absolute: 0 cells/uL (ref 0–200)
Basophils Relative: 0 %
Eosinophils Absolute: 0 cells/uL — ABNORMAL LOW (ref 15–500)
Eosinophils Relative: 0 %
HCT: 49 % (ref 38.5–50.0)
Hemoglobin: 16.9 g/dL (ref 13.2–17.1)
Lymphocytes Relative: 6 %
Lymphs Abs: 606 cells/uL — ABNORMAL LOW (ref 850–3900)
MCH: 34.1 pg — ABNORMAL HIGH (ref 27.0–33.0)
MCHC: 34.5 g/dL (ref 32.0–36.0)
MCV: 99 fL (ref 80.0–100.0)
MPV: 10.3 fL (ref 7.5–12.5)
Monocytes Absolute: 303 cells/uL (ref 200–950)
Monocytes Relative: 3 %
Neutro Abs: 9191 cells/uL — ABNORMAL HIGH (ref 1500–7800)
Neutrophils Relative %: 91 %
Platelets: 215 10*3/uL (ref 140–400)
RBC: 4.95 MIL/uL (ref 4.20–5.80)
RDW: 12.1 % (ref 11.0–15.0)
WBC: 10.1 10*3/uL (ref 3.8–10.8)

## 2016-08-08 LAB — LIPID PANEL
Cholesterol: 240 mg/dL — ABNORMAL HIGH (ref 125–200)
HDL: 61 mg/dL (ref >=40)
LDL Cholesterol: 135 mg/dL — ABNORMAL HIGH (ref <130)
Total CHOL/HDL Ratio: 3.9 Ratio (ref <=5.0)
Triglycerides: 220 mg/dL — ABNORMAL HIGH (ref <150)
VLDL: 44 mg/dL — ABNORMAL HIGH (ref <30)

## 2016-08-08 LAB — MAGNESIUM: Magnesium: 2.1 mg/dL (ref 1.5–2.5)

## 2016-08-08 LAB — BASIC METABOLIC PANEL WITH GFR
BUN: 20 mg/dL (ref 7–25)
CO2: 26 mmol/L (ref 20–31)
Calcium: 9.6 mg/dL (ref 8.6–10.3)
Chloride: 98 mmol/L (ref 98–110)
Creat: 1.07 mg/dL (ref 0.70–1.25)
GFR, Est African American: 85 mL/min (ref >=60)
GFR, Est Non African American: 73 mL/min (ref >=60)
Glucose, Bld: 121 mg/dL — ABNORMAL HIGH (ref 65–99)
Potassium: 3.9 mmol/L (ref 3.5–5.3)
Sodium: 138 mmol/L (ref 135–146)

## 2016-08-08 LAB — PSA: PSA: 1 ng/mL (ref <=4.0)

## 2016-08-08 LAB — TSH: TSH: 0.8 mIU/L (ref 0.40–4.50)

## 2016-08-08 LAB — URIC ACID: Uric Acid, Serum: 4.5 mg/dL (ref 4.0–8.0)

## 2016-08-08 LAB — TESTOSTERONE: Testosterone: 457 ng/dL (ref 250–827)

## 2016-08-08 LAB — VITAMIN B12: Vitamin B-12: 319 pg/mL (ref 200–1100)

## 2016-08-08 MED ORDER — DULOXETINE HCL 60 MG PO CPEP: ORAL_CAPSULE | ORAL | 1 refills | Status: DC

## 2016-08-08 MED ORDER — LORAZEPAM 1 MG PO TABS: 1.0000 mg | ORAL_TABLET | Freq: Two times a day (BID) | ORAL | 0 refills | Status: DC

## 2016-08-08 MED ORDER — FINASTERIDE 5 MG PO TABS: 5.0000 mg | ORAL_TABLET | Freq: Every day | ORAL | 1 refills | Status: DC

## 2016-08-08 NOTE — Progress Notes (Signed)
[facility]   Lucky Cowboy, M.D.    Dyanne Carrel. Steffanie Dunn, P.A.-C      Terri Piedra, P.A.-C   [facility]                [address]                [place], [place]. [address] Telephone [phone] Telefax [phone]  Annual  Screening/Preventative Visit  And Comprehensive Evaluation & Examination     This very nice 64y.o.DWM presents for a Wellness/Preventative Visit & comprehensive evaluation and management of multiple medical co-morbidities.  Patient has been followed for HTN, Prediabetes, Hyperlipidemia and Vitamin D Deficiency. Patient is having ongoing c/o LBP and is s/p Lumbar disc surg for DJD and currently is seeing Dr [name] and is contemplating a fusion     HTN predates since 69. Patient's BP has been controlled at home.Today's BP is  122/80. Patient denies any cardiac symptoms as chest pain, palpitations, shortness of breath, dizziness or ankle swelling.     Patient's hyperlipidemia is controlled with diet and medications. Patient denies myalgias or other medication SE's. Last lipids were at goal albeit elevated Trig's.   Lab Results  Component Value Date   CHOL 200 05/24/2016   HDL 71 05/24/2016   LDLCALC 93 05/24/2016   TRIG 182 (H) 05/24/2016   CHOLHDL 2.8 05/24/2016      Patient is screened proactively for prediabetes and patient denies reactive hypoglycemic symptoms, visual blurring, diabetic polys or paresthesias. Last A1c was at goal:  Lab Results  Component Value Date   HGBA1C 5.1 05/24/2016       Finally, patient has history of Vitamin D Deficiency of "28" in 2008 and last vitamin D was at goal: Lab Results  Component Value Date   VD25OH 68 05/24/2016   Current Outpatient Prescriptions on File Prior to Visit  Medication Sig  . aspirin 81 MG tablet Take 81 mg by mouth daily.  Marland Kitchen atenolol (TENORMIN) 100 MG tablet TAKE 1 BY MOUTH DAILY  . atorvastatin (LIPITOR) 80 MG tablet Take 1  tablet (80 mg total) by mouth daily.  . celecoxib (CELEBREX) 200 MG capsule TAKE 1 BY MOUTH DAILY  . Cholecalciferol (VITAMIN D PO) Take 5,000 Units by mouth daily.   Marland Kitchen esomeprazole (NEXIUM) 40 MG capsule TAKE 1 BY MOUTH DAILY AT NOON  . HYDROcodone-acetaminophen (NORCO/VICODIN) 5-325 MG tablet Take 1 tablet by mouth every 6 (six) hours as needed for moderate pain.  Marland Kitchen lisinopril-hydrochlorothiazide (PRINZIDE,ZESTORETIC) 20-25 MG tablet TAKE 1 BY MOUTH DAILY  . LORazepam (ATIVAN) 1 MG tablet Take 1 tablet (1 mg total) by mouth 2 (two) times daily.  . Omega-3 Fatty Acids (FISH OIL PO) Take 1,200 mg by mouth daily.   . TURMERIC PO Take 1,500 mg by mouth daily.  Marland Kitchen allopurinol (ZYLOPRIM) 300 MG tablet Take 1 tablet daily for Gout   No current facility-administered medications on file prior to visit.    No Known Allergies Past Medical History:  Diagnosis Date  . Arthritis    right thumb  . Dry eyes    Takes Restasis  . GERD (gastroesophageal reflux disease)   . Hyperlipidemia   . Hypertension   . Vitamin D deficiency    Health Maintenance  Topic Date Due  . Hepatitis C Screening  August 07, 1951  . HIV Screening  08/13/1967  . INFLUENZA VACCINE  10/04/2016 (Originally 07/09/2016)  . COLONOSCOPY  03/11/2018  . TETANUS/TDAP  01/19/2019  .  ZOSTAVAX  Completed   Immunization History  Administered Date(s) Administered  . Influenza Split 09/22/2013  . PPD Test 09/13/2014, 07/25/2015  . Pneumococcal Polysaccharide-23 01/19/2009  . Tdap 01/19/2009  . Zoster 01/29/2013   Past Surgical History:  Procedure Laterality Date  . COLONOSCOPY W/ POLYPECTOMY    . ESOPHAGOGASTRODUODENOSCOPY  1990  . TOTAL HIP ARTHROPLASTY Left 10/28/2014   Procedure: TOTAL HIP ARTHROPLASTY ANTERIOR APPROACH;  Surgeon: Eldred Manges, MD;  Location: [facility];  Service: Orthopedics;  Laterality: Left;   Family History  Problem Relation Age of Onset  . Osteoporosis Mother   . Schizophrenia Mother   . Heart disease  Father   . COPD Father   . Diabetes Brother     Social History   Social History  . Marital status: Divorced    Spouse name: N/A  . Number of children: N/A  . Years of education: N/A   Occupational History  . 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    Social History Main Topics  . Smoking status: Never Smoker  . Smokeless tobacco: Not on file  . Alcohol use Yes     Comment: social  . Drug use: No  . Sexual activity: Not on file    ROS Constitutional: Denies fever, chills, weight loss/gain, headaches, insomnia,  night sweats or change in appetite. Does c/o fatigue. Eyes: Denies redness, blurred vision, diplopia, discharge, itchy or watery eyes.  ENT: Denies discharge, congestion, post nasal drip, epistaxis, sore throat, earache, hearing loss, dental pain, Tinnitus, Vertigo, Sinus pain or snoring.  Cardio: Denies chest pain, palpitations, irregular heartbeat, syncope, dyspnea, diaphoresis, orthopnea, PND, claudication or edema Respiratory: denies cough, dyspnea, DOE, pleurisy, hoarseness, laryngitis or wheezing.  Gastrointestinal: Denies dysphagia, heartburn, reflux, water brash, pain, cramps, nausea, vomiting, bloating, diarrhea, constipation, hematemesis, melena, hematochezia, jaundice or hemorrhoids Genitourinary: Denies dysuria, frequency, urgency, nocturia, hesitancy, discharge, hematuria or flank pain Musculoskeletal: Denies arthralgia, myalgia, stiffness, Jt. Swelling, pain, limp or strain/sprain. Denies Falls. Skin: Denies puritis, rash, hives, warts, acne, eczema or change in skin lesion Neuro: No weakness, tremor, incoordination, spasms, paresthesia or pain Psychiatric: Denies confusion, memory loss or sensory loss. Denies Depression. Endocrine: Denies change in weight, skin, hair change, nocturia, and paresthesia, diabetic polys, visual blurring or hyper / hypo glycemic episodes.  Heme/Lymph: No excessive bleeding, bruising or enlarged lymph nodes.  Physical Exam  BP  122/80   Pulse 60   Temp 97 F (36.1 C)   Resp 16   Ht 5' 6.5" (1.689 m)   Wt 153 lb 9.6 oz (69.7 kg)   BMI 24.42 kg/m   General Appearance: Well nourished, in no apparent distress.  Eyes: PERRLA, EOMs, conjunctiva no swelling or erythema, normal fundi and vessels. Sinuses: No frontal/maxillary tenderness ENT/Mouth: EACs patent / TMs  nl. Nares clear without erythema, swelling, mucoid exudates. Oral hygiene is good. No erythema, swelling, or exudate. Tongue normal, non-obstructing. Tonsils not swollen or erythematous. Hearing normal.  Neck: Supple, thyroid normal. No bruits, nodes or JVD. Respiratory: Respiratory effort normal.  BS equal and clear bilateral without rales, rhonci, wheezing or stridor. Cardio: Heart sounds are normal with regular rate and rhythm and no murmurs, rubs or gallops. Peripheral pulses are normal and equal bilaterally without edema. No aortic or femoral bruits. Chest: symmetric with normal excursions and percussion.  Abdomen: Soft, with Nl bowel sounds. Nontender, no guarding, rebound, hernias, masses, or organomegaly.  Lymphatics: Non tender without lymphadenopathy.  Genitourinary: No hernias.Testes nl. DRE - prostate nl for age -  smooth & firm w/o nodules. Musculoskeletal: Full ROM all peripheral extremities, joint stability, 5/5 strength and stooped forward gait. Skin: Warm and dry without rashes, lesions, cyanosis, clubbing or  ecchymosis.  Neuro: Cranial nerves intact, reflexes equal bilaterally. Normal muscle tone, no cerebellar symptoms. Sensation intact.  Pysch: Alert and oriented X 3 with normal affect, insight and judgment appropriate.   Assessment and Plan  1. Annual Preventative/Screening Exam   - Microalbumin / creatinine urine ratio - EKG 12-Lead - US, RETROPERITNL ABD,  LTD - POC Hemoccult Bld/Stl  - Urinalysis, Routine w reflex microscopic  - Vitamin B12 - Iron and TIBC - PSA - Testosterone - CBC with Differential/Platelet - BASIC  METABOLIC PANEL WITH GFR - Hepatic function panel - Magnesium - Lipid panel - TSH - Hemoglobin A1c - Insulin, random - VITAMIN D 25 Hydroxy  - Uric acid  2. Essential hypertension  - Microalbumin / creatinine urine ratio - EKG 12-Lead - US, RETROPERITNL ABD,  LTD - TSH  3. Hyperlipidemia  - EKG 12-Lead - US, RETROPERITNL ABD,  LTD - Lipid panel - TSH  4. Blood glucose abnormal  - Hemoglobin A1c - Insulin, random  5. Vitamin D deficiency  - VITAMIN D 25 Hydroxy   6. Insulin resistance  - Hemoglobin A1c - Insulin, random  7. Idiopathic gout  - Uric acid  8. Gastroesophageal reflux disease   9. Screening for prostate cancer  - PSA  10. Encounter for colorectal cancer screening  - POC Hemoccult Bld/Stl   11. Screening for ischemic heart disease  - EKG 12-Lead  12. Screening for AAA (aortic abdominal aneurysm)  - US, RETROPERITNL ABD,  LTD  13. Other fatigue  - Vitamin B12 - Iron and TIBC - Testosterone - CBC with Differential/Platelet - TSH  14. Medication management  - Urinalysis, Routine w reflex microscopic  - CBC with Differential/Platelet - BASIC METABOLIC PANEL WITH GFR - Hepatic function panel - Magnesium  15. Screening examination for pulmonary tuberculosis  - PPD     Continue prudent diet as discussed, weight control, BP monitoring, regular exercise, and medications as discussed.  Discussed med effects and SE's. Routine screening labs and tests as requested with regular follow-up as recommended. Over 40 minutes of exam, counseling, chart review and high complex critical decision making was performed

## 2016-08-08 NOTE — Patient Instructions (Signed)

## 2016-08-09 LAB — URINALYSIS, ROUTINE W REFLEX MICROSCOPIC
Bilirubin Urine: NEGATIVE
Glucose, UA: NEGATIVE
Hgb urine dipstick: NEGATIVE
Ketones, ur: NEGATIVE
Leukocytes, UA: NEGATIVE
Nitrite: NEGATIVE
Protein, ur: NEGATIVE
Specific Gravity, Urine: 1.015 (ref 1.001–1.035)
pH: 7 (ref 5.0–8.0)

## 2016-08-09 LAB — HEMOGLOBIN A1C
Hgb A1c MFr Bld: 4.7 % (ref <5.7)
Mean Plasma Glucose: 88 mg/dL

## 2016-08-09 LAB — INSULIN, RANDOM: Insulin: 17.7 u[IU]/mL (ref 2.0–19.6)

## 2016-08-09 LAB — MICROALBUMIN / CREATININE URINE RATIO
Creatinine, Urine: 110 mg/dL (ref 20–370)
Microalb Creat Ratio: 5 mcg/mg creat (ref <30)
Microalb, Ur: 0.6 mg/dL

## 2016-08-09 LAB — VITAMIN D 25 HYDROXY (VIT D DEFICIENCY, FRACTURES): Vit D, 25-Hydroxy: 71 ng/mL (ref 30–100)

## 2016-11-07 ENCOUNTER — Encounter: Payer: Self-pay | Admitting: Internal Medicine

## 2016-11-07 ENCOUNTER — Ambulatory Visit (INDEPENDENT_AMBULATORY_CARE_PROVIDER_SITE_OTHER): Payer: BLUE CROSS/BLUE SHIELD | Admitting: Internal Medicine

## 2016-11-07 VITALS — BP 118/76 | HR 85 | Temp 97.9°F | Resp 16 | Ht 66.5 in | Wt 152.8 lb

## 2016-11-07 DIAGNOSIS — M545 Low back pain, unspecified: Secondary | ICD-10-CM

## 2016-11-07 DIAGNOSIS — E88819 Insulin resistance, unspecified: Secondary | ICD-10-CM

## 2016-11-07 DIAGNOSIS — E782 Mixed hyperlipidemia: Secondary | ICD-10-CM | POA: Diagnosis not present

## 2016-11-07 DIAGNOSIS — E8881 Metabolic syndrome: Secondary | ICD-10-CM

## 2016-11-07 DIAGNOSIS — I10 Essential (primary) hypertension: Secondary | ICD-10-CM | POA: Diagnosis not present

## 2016-11-07 DIAGNOSIS — E559 Vitamin D deficiency, unspecified: Secondary | ICD-10-CM | POA: Diagnosis not present

## 2016-11-07 DIAGNOSIS — R7309 Other abnormal glucose: Secondary | ICD-10-CM | POA: Diagnosis not present

## 2016-11-07 DIAGNOSIS — Z79899 Other long term (current) drug therapy: Secondary | ICD-10-CM | POA: Diagnosis not present

## 2016-11-07 DIAGNOSIS — G8929 Other chronic pain: Secondary | ICD-10-CM

## 2016-11-07 LAB — CBC WITH DIFFERENTIAL/PLATELET
Basophils Absolute: 0 cells/uL (ref 0–200)
Basophils Relative: 0 %
Eosinophils Absolute: 77 cells/uL (ref 15–500)
Eosinophils Relative: 1 %
HCT: 43.2 % (ref 38.5–50.0)
Hemoglobin: 14.9 g/dL (ref 13.2–17.1)
Lymphocytes Relative: 15 %
Lymphs Abs: 1155 cells/uL (ref 850–3900)
MCH: 33.6 pg — ABNORMAL HIGH (ref 27.0–33.0)
MCHC: 34.5 g/dL (ref 32.0–36.0)
MCV: 97.5 fL (ref 80.0–100.0)
MPV: 9.8 fL (ref 7.5–12.5)
Monocytes Absolute: 1001 cells/uL — ABNORMAL HIGH (ref 200–950)
Monocytes Relative: 13 %
Neutro Abs: 5467 cells/uL (ref 1500–7800)
Neutrophils Relative %: 71 %
Platelets: 194 10*3/uL (ref 140–400)
RBC: 4.43 MIL/uL (ref 4.20–5.80)
RDW: 12.5 % (ref 11.0–15.0)
WBC: 7.7 10*3/uL (ref 3.8–10.8)

## 2016-11-07 MED ORDER — FLUTICASONE PROPIONATE 50 MCG/ACT NA SUSP: 2.0000 | Freq: Every day | NASAL | 0 refills | Status: DC

## 2016-11-07 NOTE — Progress Notes (Signed)
Assessment and Plan:  Hypertension:  -Continue medication,  -monitor blood pressure at home.  -Continue DASH diet.   -Reminder to go to the ER if any CP, SOB, nausea, dizziness, severe HA, changes vision/speech, left arm numbness and tingling, and jaw pain.  Cholesterol: -Continue diet and exercise.  -Check cholesterol.   Pre-diabetes: -Continue diet and exercise.  -Check A1C  Vitamin D Def: -check level -continue medications.   Chronic back pain -following with pain management -discussed possible benefits of doing physical therapy and continuing trigger point injections -likely caused by muscular pain  Continue diet and meds as discussed. Further disposition pending results of labs.  HPI 65 y.o. male  presents for 3 month follow up with hypertension, hyperlipidemia, prediabetes and vitamin D.   His blood pressure has been controlled at home, today their BP is BP: 118/76.   He does not workout. He denies chest pain, shortness of breath, dizziness.  He has been limited by his activity due to his back pain.     He is on cholesterol medication and denies myalgias. His cholesterol is at goal. The cholesterol last visit was:   Lab Results  Component Value Date   CHOL 240 (H) 08/08/2016   HDL 61 08/08/2016   LDLCALC 135 (H) 08/08/2016   TRIG 220 (H) 08/08/2016   CHOLHDL 3.9 08/08/2016     He has been working on diet and exercise for prediabetes, and denies foot ulcerations, hyperglycemia, hypoglycemia , increased appetite, nausea, paresthesia of the feet, polydipsia, polyuria, visual disturbances, vomiting and weight loss. Last A1C in the office was:  Lab Results  Component Value Date   HGBA1C 4.7 08/08/2016    Patient is on Vitamin D supplement.  Lab Results  Component Value Date   VD25OH 6471 08/08/2016     Patient reports that he has still continued to have some difficulty with his back.  He went to neurosurgery and also the orthopedist. He was sent to a pain management  doctor Dr. Abigail MiyamotoBarco.  He reports that he did some injections and that didn't help much.  He reports that he had a final injection in the lower hip.  He reports that this helped relieve the pain.  He has been doing good for the last month.  He reports that he is interested in doing some physical therapy.  He reports that [facility] was [facility] in the past.  He reports that he is very active in the past.      Current Medications:  Current Outpatient Prescriptions on File Prior to Visit  Medication Sig Dispense Refill  . aspirin 81 MG tablet Take 81 mg by mouth daily.    Marland Kitchen. atenolol (TENORMIN) 100 MG tablet TAKE 1 BY MOUTH DAILY 90 tablet 1  . atorvastatin (LIPITOR) 80 MG tablet Take 1 tablet (80 mg total) by mouth daily. 30 tablet 3  . celecoxib (CELEBREX) 200 MG capsule TAKE 1 BY MOUTH DAILY 90 capsule 3  . Cholecalciferol (VITAMIN D PO) Take 5,000 Units by mouth daily.     Marland Kitchen. esomeprazole (NEXIUM) 40 MG capsule TAKE 1 BY MOUTH DAILY AT NOON 90 capsule 1  . finasteride (PROSCAR) 5 MG tablet Take 1 tablet (5 mg total) by mouth daily. 90 tablet 1  . HYDROcodone-acetaminophen (NORCO/VICODIN) 5-325 MG tablet Take 1 tablet by mouth every 6 (six) hours as needed for moderate pain.    Marland Kitchen. lisinopril-hydrochlorothiazide (PRINZIDE,ZESTORETIC) 20-25 MG tablet TAKE 1 BY MOUTH DAILY 90 tablet 1  .  LORazepam (ATIVAN) 1 MG tablet Take 1 tablet (1 mg total) by mouth 2 (two) times daily. 180 tablet 0  . Omega-3 Fatty Acids (FISH OIL PO) Take 1,200 mg by mouth daily.     . TURMERIC PO Take 1,500 mg by mouth daily.     No current facility-administered medications on file prior to visit.     Medical History:  Past Medical History:  Diagnosis Date  . Arthritis    right thumb  . Dry eyes    Takes Restasis  . GERD (gastroesophageal reflux disease)   . Hyperlipidemia   . Hypertension   . Vitamin D deficiency     Allergies: No Known Allergies   Review of Systems:  Review of Systems   Constitutional: Negative for chills, fever and malaise/fatigue.  HENT: Negative for congestion, ear pain and sore throat.   Eyes: Negative.   Respiratory: Negative for cough, shortness of breath and wheezing.   Cardiovascular: Negative for chest pain, palpitations and leg swelling.  Gastrointestinal: Negative for abdominal pain, blood in stool, constipation, diarrhea, heartburn and melena.  Genitourinary: Negative.   Musculoskeletal: Positive for back pain.  Skin: Negative.   Neurological: Negative for dizziness, sensory change, loss of consciousness and headaches.  Psychiatric/Behavioral: Negative for depression. The patient is not nervous/anxious and does not have insomnia.     Family history- Review and unchanged  Social history- Review and unchanged  Physical Exam: BP 118/76   Pulse 85   Temp 97.9 F (36.6 C)   Resp 16   Ht 5' 6.5" (1.689 m)   Wt 152 lb 12.8 oz (69.3 kg)   SpO2 97%   BMI 24.29 kg/m  Wt Readings from Last 3 Encounters:  11/07/16 152 lb 12.8 oz (69.3 kg)  08/08/16 153 lb 9.6 oz (69.7 kg)  05/24/16 153 lb 3.2 oz (69.5 kg)    General Appearance: Well nourished well developed, in no apparent distress. Eyes: PERRLA, EOMs, conjunctiva no swelling or erythema ENT/Mouth: Ear canals normal without obstruction, swelling, erythma, discharge.  TMs normal bilaterally.  Oropharynx moist, clear, without exudate, or postoropharyngeal swelling. Neck: Supple, thyroid normal,no cervical adenopathy  Respiratory: Respiratory effort normal, Breath sounds clear A&P without rhonchi, wheeze, or rale.  No retractions, no accessory usage. Cardio: RRR with no MRGs. Brisk peripheral pulses without edema.  Abdomen: Soft, + BS,  Non tender, no guarding, rebound, hernias, masses. Musculoskeletal: Full ROM, 5/5 strength, Normal gait Skin: Warm, dry without rashes, lesions, ecchymosis.  Neuro: Awake and oriented X 3, Cranial nerves intact. Normal muscle tone, no cerebellar  symptoms. Psych: Normal affect, Insight and Judgment appropriate.    Terri Piedraourtney Forcucci, PA-C 4:08 PM [facility]

## 2016-11-08 LAB — BASIC METABOLIC PANEL WITH GFR
BUN: 22 mg/dL (ref 7–25)
CO2: 29 mmol/L (ref 20–31)
Calcium: 8.9 mg/dL (ref 8.6–10.3)
Chloride: 100 mmol/L (ref 98–110)
Creat: 1.3 mg/dL — ABNORMAL HIGH (ref 0.70–1.25)
GFR, Est African American: 67 mL/min (ref >=60)
GFR, Est Non African American: 58 mL/min — ABNORMAL LOW (ref >=60)
Glucose, Bld: 97 mg/dL (ref 65–99)
Potassium: 3.7 mmol/L (ref 3.5–5.3)
Sodium: 135 mmol/L (ref 135–146)

## 2016-11-08 LAB — HEPATIC FUNCTION PANEL
ALT: 21 U/L (ref 9–46)
AST: 19 U/L (ref 10–35)
Albumin: 3.8 g/dL (ref 3.6–5.1)
Alkaline Phosphatase: 42 U/L (ref 40–115)
Bilirubin, Direct: 0.1 mg/dL (ref <=0.2)
Indirect Bilirubin: 0.5 mg/dL (ref 0.2–1.2)
Total Bilirubin: 0.6 mg/dL (ref 0.2–1.2)
Total Protein: 5.8 g/dL — ABNORMAL LOW (ref 6.1–8.1)

## 2016-11-08 LAB — LIPID PANEL
Cholesterol: 173 mg/dL (ref <200)
HDL: 63 mg/dL (ref >40)
LDL Cholesterol: 75 mg/dL (ref <100)
Total CHOL/HDL Ratio: 2.7 Ratio (ref <5.0)
Triglycerides: 176 mg/dL — ABNORMAL HIGH (ref <150)
VLDL: 35 mg/dL — ABNORMAL HIGH (ref <30)

## 2016-11-27 ENCOUNTER — Other Ambulatory Visit: Payer: Self-pay | Admitting: *Deleted

## 2016-11-27 MED ORDER — LISINOPRIL-HYDROCHLOROTHIAZIDE 20-25 MG PO TABS: ORAL_TABLET | ORAL | 0 refills | Status: DC

## 2016-11-27 MED ORDER — ATENOLOL 100 MG PO TABS: ORAL_TABLET | ORAL | 0 refills | Status: DC

## 2017-01-01 ENCOUNTER — Encounter: Payer: Self-pay | Admitting: Internal Medicine

## 2017-01-02 ENCOUNTER — Other Ambulatory Visit: Payer: Self-pay | Admitting: Internal Medicine

## 2017-01-02 MED ORDER — PREDNISONE 20 MG PO TABS: ORAL_TABLET | ORAL | 0 refills | Status: DC

## 2017-01-02 MED ORDER — AZITHROMYCIN 250 MG PO TABS: ORAL_TABLET | ORAL | 0 refills | Status: DC

## 2017-01-14 ENCOUNTER — Other Ambulatory Visit: Payer: Self-pay | Admitting: Internal Medicine

## 2017-01-14 MED ORDER — ATENOLOL 100 MG PO TABS: ORAL_TABLET | ORAL | 0 refills | Status: DC

## 2017-01-27 ENCOUNTER — Encounter: Payer: Self-pay | Admitting: Physical Medicine & Rehabilitation

## 2017-01-27 ENCOUNTER — Encounter: Payer: Self-pay | Admitting: Physician Assistant

## 2017-01-27 DIAGNOSIS — E782 Mixed hyperlipidemia: Secondary | ICD-10-CM

## 2017-01-27 MED ORDER — ATORVASTATIN CALCIUM 80 MG PO TABS: 80.0000 mg | ORAL_TABLET | Freq: Every day | ORAL | 3 refills | Status: DC

## 2017-02-10 ENCOUNTER — Other Ambulatory Visit: Payer: Self-pay | Admitting: *Deleted

## 2017-02-10 ENCOUNTER — Ambulatory Visit: Payer: Self-pay | Admitting: Internal Medicine

## 2017-02-11 ENCOUNTER — Other Ambulatory Visit: Payer: Self-pay | Admitting: *Deleted

## 2017-02-11 ENCOUNTER — Ambulatory Visit (HOSPITAL_BASED_OUTPATIENT_CLINIC_OR_DEPARTMENT_OTHER): Payer: BLUE CROSS/BLUE SHIELD | Admitting: Physical Medicine & Rehabilitation

## 2017-02-11 ENCOUNTER — Encounter: Payer: BLUE CROSS/BLUE SHIELD | Attending: Physical Medicine & Rehabilitation

## 2017-02-11 ENCOUNTER — Encounter: Payer: Self-pay | Admitting: Physical Medicine & Rehabilitation

## 2017-02-11 VITALS — BP 157/85 | HR 58 | Resp 14

## 2017-02-11 DIAGNOSIS — M545 Low back pain: Secondary | ICD-10-CM | POA: Diagnosis not present

## 2017-02-11 DIAGNOSIS — M533 Sacrococcygeal disorders, not elsewhere classified: Secondary | ICD-10-CM | POA: Diagnosis not present

## 2017-02-11 DIAGNOSIS — G8929 Other chronic pain: Secondary | ICD-10-CM | POA: Insufficient documentation

## 2017-02-11 MED ORDER — ESOMEPRAZOLE MAGNESIUM 40 MG PO CPDR: DELAYED_RELEASE_CAPSULE | ORAL | 1 refills | Status: DC

## 2017-02-11 MED ORDER — CELECOXIB 200 MG PO CAPS: ORAL_CAPSULE | ORAL | 2 refills | Status: DC

## 2017-02-11 MED ORDER — FINASTERIDE 5 MG PO TABS: 5.0000 mg | ORAL_TABLET | Freq: Every day | ORAL | 1 refills | Status: DC

## 2017-02-11 NOTE — Patient Instructions (Signed)
Do not think Botox would be helpful in alleviating the left buttock pain.

## 2017-02-11 NOTE — Progress Notes (Signed)
Subjective:    Patient ID: [name] [name], male    DOB: [birth year], 66 y.o.   MRN: [id number]  HPI Larey Seat off ladder 1 yr ago, L1 fracture  Consult requested by Dr. Murray Hodgkins to evaluate the feasibility of using botulinum toxin for muscle pain in the left buttock area.  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 Facet arthropathy at that level. Has had right L4 transforaminal in the past, which has been helpful for some sciatic symptoms in the past Patient evaluated by neurosurgery and not felt to require lumbar spine surgery.  Sacroiliac injection performed on the left side. Initial sacroiliac injection was helpful for at least 1 week. Patient reports 2d partial pain reduction after a second sacroiliac injection.  Trigger point injections per Dr Murray Hodgkins, help for ~70month, these help for about 100% relief times 1 week and then diminishes over time. Trigger points in the left gluteus, 3 cc of 1% lidocaine with 30 mg of Kenalog injected.  Pain is worsened by turning over from prone to supine and supine to prone. He has pain with extending his spine spine.  Pain Inventory Average Pain 6 Pain Right Now 6 My pain is intermittent, sharp and dull  In the last 24 hours, has pain interfered with the following? General activity 5 Relation with others 5 Enjoyment of life 5 What TIME of day is your pain at its worst? evening Sleep (in general) Fair  Pain is worse with: standing and some activites Pain improves with: rest, medication and injections Relief from Meds: 8  Mobility walk without assistance how many minutes can you walk? 30 ability to climb steps?  yes do you drive?  yes Do you have any goals in this area?  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  no  Neuro/Psych trouble walking  Prior Studies CT/MRI new visit  Physicians involved in your care new visit   Family History    Problem Relation Age of Onset  . Osteoporosis Mother   . Schizophrenia Mother   . Heart disease Father   . COPD Father   . Diabetes Brother    Social History   Social History  . Marital status: Divorced    Spouse name: N/A  . Number of children: N/A  . Years of education: N/A   Social History Main Topics  . Smoking status: Never Smoker  . Smokeless tobacco: Never Used  . Alcohol use Yes     Comment: social  . Drug use: No  . Sexual activity: Not Asked   Other Topics Concern  . None   Social History Narrative  . None   Past Surgical History:  Procedure Laterality Date  . COLONOSCOPY W/ POLYPECTOMY    . ESOPHAGOGASTRODUODENOSCOPY  1990  . 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 Arthritis    right thumb  . Dry eyes    Takes Restasis  . GERD (gastroesophageal reflux disease)   . Hyperlipidemia   . Hypertension   . Vitamin D deficiency    BP (!) 157/85 (BP Location: Right Arm, Patient Position: Sitting, Cuff Size: Normal)   Pulse (!) 58   Resp 14   SpO2 97%   Opioid Risk Score:  Fall Risk Score:  `1  Depression screen PHQ 2/9  [facility] 02/11/2017 08/08/2016 05/24/2016 01/26/2016  Decreased Interest 0 0 0 0  Down, Depressed, Hopeless 0 0 0 0  PHQ - 2 Score 0 0 0 0  Altered sleeping 0 - - -  Tired, decreased energy 1 - - -  Change in appetite 0 - - -  Feeling bad or failure about yourself  0 - - -  Trouble concentrating 0 - - -  Moving slowly or fidgety/restless 0 - - -  Suicidal thoughts 0 - - -  PHQ-9 Score 1 - - -  Difficult doing work/chores Not difficult at all - - -    Review of Systems  Constitutional: Negative.   HENT: Negative.   Eyes: Negative.   Respiratory: Negative.   Cardiovascular: Negative.   Gastrointestinal: Negative.   Endocrine:  Negative.   Musculoskeletal: Positive for back pain, gait problem and myalgias.  Skin: Negative.   Allergic/Immunologic: Negative.   Hematological: Negative.   Psychiatric/Behavioral: Negative.   All other systems reviewed and are negative.      Objective:   Physical Exam  Constitutional: He is oriented to person, place, and time. He appears well-developed and well-nourished.  HENT:  Head: Normocephalic and atraumatic.  Eyes: Conjunctivae and EOM are normal. Pupils are equal, round, and reactive to light.  Neck: Normal range of motion. Neck supple.  Musculoskeletal:       Right hip: He exhibits normal range of motion and normal strength.       Left hip: He exhibits decreased range of motion. He exhibits no bony tenderness.       Lumbar back: He exhibits decreased range of motion, bony tenderness and deformity. He exhibits no spasm.  Neurological: He is alert and oriented to person, place, and time. He displays no atrophy and no tremor. No sensory deficit. He exhibits normal muscle tone. Coordination and gait normal.  Motor strength is 5/5 bilateral hip flexor, knee extensor, ankle dorsiflexor and plantar flexor  Gait is without evidence of toe drag or knee instability.  Psychiatric: He has a normal mood and affect. His behavior is normal. Judgment and thought content normal.  Nursing note and vitals reviewed.  Positive Faber's causing left SI pain when abducting and flexing the right hip  No pain when stretching. Left gluteus medius or left gluteus maximus  There is no tenderness to superficial palpation over the gluteus muscles. He does have pain only with very deep palpation in the region of the sacroiliac joint on the left side        Assessment & Plan:  1. Chronic left buttock pain.  Exam and mechanism of injury, most consistent with sacroiliac mediated pain. Given this patient's thin body habitus and volume of lidocaine/Kenalog solution, it is possible that this may be in  fact, anesthetizing sacroiliac joint area. I have reviewed notes from Dr. Carolyn StareBarto, which indicates that he meets criteria for sacroiliac RFA. I would agree with that.  I do not think botulinum toxin to that area would likely result in a significant pain relief. I also explained to patient that this is unlikely to be a covered benefit under his insurance and that even if helpful, it would have to be repeated every 3 months  Pt will f/u on prn basis

## 2017-02-22 ENCOUNTER — Other Ambulatory Visit: Payer: Self-pay | Admitting: Internal Medicine

## 2017-04-29 ENCOUNTER — Other Ambulatory Visit: Payer: Self-pay | Admitting: Physician Assistant

## 2017-04-29 MED ORDER — ATENOLOL 100 MG PO TABS: ORAL_TABLET | ORAL | 0 refills | Status: DC

## 2017-05-13 ENCOUNTER — Encounter: Payer: Self-pay | Admitting: Internal Medicine

## 2017-05-13 ENCOUNTER — Ambulatory Visit (INDEPENDENT_AMBULATORY_CARE_PROVIDER_SITE_OTHER): Payer: BLUE CROSS/BLUE SHIELD | Admitting: Internal Medicine

## 2017-05-13 VITALS — BP 110/72 | HR 64 | Temp 97.5°F | Resp 16 | Ht 66.5 in | Wt 153.6 lb

## 2017-05-13 DIAGNOSIS — I10 Essential (primary) hypertension: Secondary | ICD-10-CM | POA: Diagnosis not present

## 2017-05-13 DIAGNOSIS — E559 Vitamin D deficiency, unspecified: Secondary | ICD-10-CM | POA: Diagnosis not present

## 2017-05-13 DIAGNOSIS — E782 Mixed hyperlipidemia: Secondary | ICD-10-CM

## 2017-05-13 DIAGNOSIS — Z79899 Other long term (current) drug therapy: Secondary | ICD-10-CM

## 2017-05-13 DIAGNOSIS — M1000 Idiopathic gout, unspecified site: Secondary | ICD-10-CM | POA: Diagnosis not present

## 2017-05-13 DIAGNOSIS — K219 Gastro-esophageal reflux disease without esophagitis: Secondary | ICD-10-CM | POA: Diagnosis not present

## 2017-05-13 DIAGNOSIS — R7309 Other abnormal glucose: Secondary | ICD-10-CM

## 2017-05-13 DIAGNOSIS — E8881 Metabolic syndrome: Secondary | ICD-10-CM

## 2017-05-13 DIAGNOSIS — E88819 Insulin resistance, unspecified: Secondary | ICD-10-CM

## 2017-05-13 LAB — CBC WITH DIFFERENTIAL/PLATELET
Basophils Absolute: 0 cells/uL (ref 0–200)
Basophils Relative: 0 %
Eosinophils Absolute: 0 cells/uL — ABNORMAL LOW (ref 15–500)
Eosinophils Relative: 0 %
HCT: 45.2 % (ref 38.5–50.0)
Hemoglobin: 15.5 g/dL (ref 13.2–17.1)
Lymphocytes Relative: 12 %
Lymphs Abs: 900 cells/uL (ref 850–3900)
MCH: 34.1 pg — ABNORMAL HIGH (ref 27.0–33.0)
MCHC: 34.3 g/dL (ref 32.0–36.0)
MCV: 99.3 fL (ref 80.0–100.0)
MPV: 9.6 fL (ref 7.5–12.5)
Monocytes Absolute: 825 cells/uL (ref 200–950)
Monocytes Relative: 11 %
Neutro Abs: 5775 cells/uL (ref 1500–7800)
Neutrophils Relative %: 77 %
Platelets: 233 10*3/uL (ref 140–400)
RBC: 4.55 MIL/uL (ref 4.20–5.80)
RDW: 13 % (ref 11.0–15.0)
WBC: 7.5 10*3/uL (ref 3.8–10.8)

## 2017-05-13 NOTE — Patient Instructions (Signed)

## 2017-05-13 NOTE — Progress Notes (Signed)
This very nice 66 y.o. DWM presents for 6 month follow up with Hypertension, Hyperlipidemia, Pre-Diabetes and Vitamin D Deficiency. Patient has hx/o GERD controlled w/prudent diet and meds. Also he has hx/o Lumbar DDD/sp stenosis followed by Dr Yetta BarreJones conservatively recommendoing no surgery and has since been following with Dr Murray HodgkinsBartko for Trigger point injections with Kenalog and apparently is doing much better.      Patient is treated for HTN (1988)  & BP has been controlled at home. Today's BP is at goal - 110/72. Patient has had no complaints of any cardiac type chest pain, palpitations, dyspnea/orthopnea/PND, dizziness, claudication, or dependent edema.     Hyperlipidemia is controlled with diet & meds. Patient denies myalgias or other med SE's. Last Lipids were  Lab Results  Component Value Date   CHOL 173 11/07/2016   HDL 63 11/07/2016   LDLCALC 75 11/07/2016   TRIG 176 (H) 11/07/2016   CHOLHDL 2.7 11/07/2016      Also, the patient is actively screened for PreDiabetes and has had no symptoms of reactive hypoglycemia, diabetic polys, paresthesias or visual blurring.  Last A1c was at goal: Lab Results  Component Value Date   HGBA1C 4.7 08/08/2016      Further, the patient also has history of Vitamin D Deficiency ("28" in 2008) and supplements vitamin D without any suspected side-effects. Last vitamin D was   Lab Results  Component Value Date   VD25OH 3771 08/08/2016   Current Outpatient Prescriptions on File Prior to Visit  Medication Sig  . aspirin 81 MG tablet Take 81 mg by mouth daily.  Marland Kitchen. atenolol (TENORMIN) 100 MG tablet TAKE 1 BY MOUTH DAILY  . atorvastatin (LIPITOR) 80 MG tablet Take 1 tablet (80 mg total) by mouth daily.  . celecoxib (CELEBREX) 200 MG capsule TAKE 1 BY MOUTH DAILY  . Cholecalciferol (VITAMIN D PO) Take 5,000 Units by mouth daily.   Marland Kitchen. esomeprazole (NEXIUM) 40 MG capsule TAKE 1 BY MOUTH DAILY AT NOON  . finasteride (PROSCAR) 5 MG tablet Take 1 tablet (5 mg  total) by mouth daily.  Marland Kitchen. lisinopril-hydrochlorothiazide (PRINZIDE,ZESTORETIC) 20-25 MG tablet TAKE 1 TABLET BY MOUTH DAILY   No Known Allergies  PMHx:   Past Medical History:  Diagnosis Date  . Arthritis    right thumb  . Dry eyes    Takes Restasis  . GERD (gastroesophageal reflux disease)   . Hyperlipidemia   . Hypertension   . Vitamin D deficiency    Immunization History  Administered Date(s) Administered  . Influenza Split 09/22/2013  . PPD Test 09/13/2014, 07/25/2015, 08/08/2016  . Pneumococcal Polysaccharide-23 01/19/2009  . Tdap 01/19/2009  . Zoster 01/29/2013   Past Surgical History:  Procedure Laterality Date  . COLONOSCOPY W/ POLYPECTOMY    . ESOPHAGOGASTRODUODENOSCOPY  1990  . 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 Eyes: Denies redness, blurred vision, diplopia, discharge, itchy, watery eyes.  ENT: Denies discharge, congestion, post nasal drip, epistaxis, sore throat, earache, hearing loss, dental pain, tinnitus, vertigo, sinus pain, snoring.  CV: Denies chest pain, palpitations, irregular heartbeat, syncope, dyspnea, diaphoresis, orthopnea, PND, claudication or edema. Respiratory: denies cough, dyspnea, DOE,  pleurisy, hoarseness, laryngitis, wheezing.  Gastrointestinal: Denies dysphagia, odynophagia, heartburn, reflux, water brash, abdominal pain or cramps, nausea, vomiting, bloating, diarrhea, constipation, hematemesis, melena, hematochezia  or hemorrhoids. Genitourinary: Denies dysuria, frequency, urgency, nocturia, hesitancy, discharge, hematuria or flank pain. Musculoskeletal: Denies arthralgias, myalgias, stiffness, jt.  swelling, pain, limping or strain/sprain.  Skin: Denies pruritus, rash, hives, warts, acne, eczema or change in skin lesion(s). Neuro: No weakness, tremor, incoordination, spasms, paresthesia or pain. Psychiatric: Denies confusion, memory loss or sensory loss. Endo: Denies change in weight, skin or hair change.  Heme/Lymph: No excessive bleeding, bruising or enlarged lymph nodes.  Physical Exam  BP 110/72   Pulse 64   Temp 97.5 F (36.4 C)   Resp 16   Ht 5' 6.5" (1.689 m)   Wt 153 lb 9.6 oz (69.7 kg)   BMI 24.42 kg/m   Appears well nourished, well groomed  and in no distress.  Eyes: PERRLA, EOMs, conjunctiva no swelling or erythema. Sinuses: No frontal/maxillary tenderness ENT/Mouth: EAC's clear, TM's nl w/o erythema, bulging. Nares clear w/o erythema, swelling, exudates. Oropharynx clear without erythema or exudates. Oral hygiene is good. Tongue normal, non obstructing. Hearing intact.  Neck: Supple. Thyroid nl. Car 2+/2+ without bruits, nodes or JVD. Chest: Respirations nl with BS clear & equal w/o rales, rhonchi, wheezing or stridor.  Cor: Heart sounds normal w/ regular rate and rhythm without sig. murmurs, gallops, clicks or rubs. Peripheral pulses normal and equal  without edema.  Abdomen: Soft & bowel sounds normal. Non-tender w/o guarding, rebound, hernias, masses or organomegaly.  Lymphatics: Unremarkable.  Musculoskeletal: Full ROM all peripheral extremities, joint stability, 5/5 strength and normal gait.  Skin: Warm, dry without exposed rashes, lesions or ecchymosis apparent.  Neuro: Cranial nerves intact, reflexes equal bilaterally. Sensory-motor testing grossly intact. Tendon reflexes grossly intact.  Pysch: Alert & oriented x 3.  Insight and judgement nl & appropriate. No ideations.  Assessment and Plan:  1. Essential hypertension  - Continue medication, monitor blood pressure at home.  - Continue DASH diet. Reminder to go to the ER if any CP,  SOB, nausea,  dizziness, severe HA, changes vision/speech,  left arm numbness and tingling and jaw pain.  - CBC with Differential/Platelet - BASIC METABOLIC PANEL WITH GFR - Magnesium - TSH  2. Hyperlipidemia, mixed  - Continue diet/meds, exercise,& lifestyle modifications.  - Continue monitor periodic cholesterol/liver & renal functions   - Hepatic function panel - Lipid panel - TSH  3. Blood glucose abnormal  - Continue diet, exercise, lifestyle modifications.  - Monitor appropriate labs. - Hemoglobin A1c - Insulin, random  4. Vitamin D deficiency  - Continue supplementation.  - VITAMIN D 25 Hydroxy   5. Insulin resistance  - Hemoglobin A1c - Insulin, random  6. Idiopathic gout  - Uric acid  7. Gastroesophageal reflux disease   8. Medication management  - CBC with Differential/Platelet - BASIC METABOLIC PANEL WITH GFR - Hepatic function panel - Magnesium - Lipid panel - TSH - Hemoglobin A1c - Insulin, random - VITAMIN D 25 Hydroxy  - Uric acid      Discussed  regular exercise, BP monitoring, weight control to achieve/maintain BMI less than 25 and discussed med and SE's. Recommended labs to assess and monitor clinical status with further disposition pending results of labs. Over 30 minutes of exam, counseling, chart review was performed.

## 2017-05-14 LAB — HEPATIC FUNCTION PANEL
ALT: 18 U/L (ref 9–46)
AST: 17 U/L (ref 10–35)
Albumin: 4.1 g/dL (ref 3.6–5.1)
Alkaline Phosphatase: 47 U/L (ref 40–115)
Bilirubin, Direct: 0.1 mg/dL (ref <=0.2)
Indirect Bilirubin: 0.5 mg/dL (ref 0.2–1.2)
Total Bilirubin: 0.6 mg/dL (ref 0.2–1.2)
Total Protein: 6.4 g/dL (ref 6.1–8.1)

## 2017-05-14 LAB — HEMOGLOBIN A1C
Hgb A1c MFr Bld: 4.9 % (ref <5.7)
Mean Plasma Glucose: 94 mg/dL

## 2017-05-14 LAB — MAGNESIUM: Magnesium: 2.2 mg/dL (ref 1.5–2.5)

## 2017-05-14 LAB — BASIC METABOLIC PANEL WITH GFR
BUN: 25 mg/dL (ref 7–25)
CO2: 27 mmol/L (ref 20–31)
Calcium: 9.3 mg/dL (ref 8.6–10.3)
Chloride: 99 mmol/L (ref 98–110)
Creat: 1.3 mg/dL — ABNORMAL HIGH (ref 0.70–1.25)
GFR, Est African American: 67 mL/min (ref >=60)
GFR, Est Non African American: 58 mL/min — ABNORMAL LOW (ref >=60)
Glucose, Bld: 102 mg/dL — ABNORMAL HIGH (ref 65–99)
Potassium: 4 mmol/L (ref 3.5–5.3)
Sodium: 137 mmol/L (ref 135–146)

## 2017-05-14 LAB — LIPID PANEL
Cholesterol: 193 mg/dL
HDL: 59 mg/dL
LDL Cholesterol: 90 mg/dL
Total CHOL/HDL Ratio: 3.3 ratio
Triglycerides: 220 mg/dL — ABNORMAL HIGH
VLDL: 44 mg/dL — ABNORMAL HIGH

## 2017-05-14 LAB — URIC ACID: Uric Acid, Serum: 7.5 mg/dL (ref 4.0–8.0)

## 2017-05-14 LAB — TSH: TSH: 0.5 m[IU]/L (ref 0.40–4.50)

## 2017-05-14 LAB — INSULIN, RANDOM: Insulin: 33.7 u[IU]/mL — ABNORMAL HIGH (ref 2.0–19.6)

## 2017-05-14 LAB — VITAMIN D 25 HYDROXY (VIT D DEFICIENCY, FRACTURES): Vit D, 25-Hydroxy: 56 ng/mL (ref 30–100)

## 2017-06-03 ENCOUNTER — Other Ambulatory Visit: Payer: Self-pay | Admitting: Physician Assistant

## 2017-06-03 MED ORDER — ATENOLOL 100 MG PO TABS: ORAL_TABLET | ORAL | 1 refills | Status: DC

## 2017-06-23 ENCOUNTER — Encounter: Payer: Self-pay | Admitting: Physician Assistant

## 2017-06-23 ENCOUNTER — Ambulatory Visit (INDEPENDENT_AMBULATORY_CARE_PROVIDER_SITE_OTHER): Payer: BLUE CROSS/BLUE SHIELD | Admitting: Physician Assistant

## 2017-06-23 VITALS — BP 120/80 | HR 68 | Temp 98.2°F | Resp 14 | Ht 66.5 in | Wt 150.8 lb

## 2017-06-23 DIAGNOSIS — G8929 Other chronic pain: Secondary | ICD-10-CM | POA: Diagnosis not present

## 2017-06-23 DIAGNOSIS — M5442 Lumbago with sciatica, left side: Secondary | ICD-10-CM

## 2017-06-23 MED ORDER — DEXAMETHASONE SODIUM PHOSPHATE 100 MG/10ML IJ SOLN
20.0000 mg | Freq: Once | INTRAMUSCULAR | Status: AC
  Administered 2017-06-23: 20 mg via INTRAMUSCULAR

## 2017-06-23 NOTE — Patient Instructions (Signed)

## 2017-06-23 NOTE — Progress Notes (Signed)
Subjective:    Patient ID: [name] [name], male    DOB: 06/07/1951, 66 y.o.   MRN: [id number]  HPI 66 y.o. WM presents with back pain.  Larey SeatFell off a ladder in Feb/ march 2016, has seen Dr. Ophelia CharterYates and Dr. Yetta BarreJones for his back pain in the past, normal MRI. No help from epidural injections but had trigger point injection with pain management that helped. Now it returns every 5-8 weeks with activity/exercise, can still work.   Blood pressure 120/80, pulse 68, temperature 98.2 F (36.8 C), resp. rate 14, height 5' 6.5" (1.689 m), weight 150 lb 12.8 oz (68.4 kg), SpO2 97 %.  Medications Current Outpatient Prescriptions on File Prior to Visit  Medication Sig  . amphetamine-dextroamphetamine (ADDERALL) 20 MG tablet   . aspirin 81 MG tablet Take 81 mg by mouth daily.  Marland Kitchen. atenolol (TENORMIN) 100 MG tablet TAKE 1 BY MOUTH DAILY  . atorvastatin (LIPITOR) 80 MG tablet Take 1 tablet (80 mg total) by mouth daily.  . celecoxib (CELEBREX) 200 MG capsule TAKE 1 BY MOUTH DAILY  . Cholecalciferol (VITAMIN D PO) Take 5,000 Units by mouth daily.   Marland Kitchen. esomeprazole (NEXIUM) 40 MG capsule TAKE 1 BY MOUTH DAILY AT NOON  . finasteride (PROSCAR) 5 MG tablet Take 1 tablet (5 mg total) by mouth daily.  Marland Kitchen. lisinopril-hydrochlorothiazide (PRINZIDE,ZESTORETIC) 20-25 MG tablet TAKE 1 TABLET BY MOUTH DAILY   No current facility-administered medications on file prior to visit.     Problem list He has Hypertension; Hyperlipidemia; GERD (gastroesophageal reflux disease); Vitamin D deficiency; Encounter for long-term (current) use of medications; Blood glucose abnormal; Medication management; Insulin resistance; Left hip pain; Avascular necrosis of left femoral head (HCC); Insomnia; Gout; Encounter for general adult medical examination with abnormal findings; and DDD (degenerative disc disease), lumbosacral on his problem list.   Review of Systems  Constitutional: Negative for chills and fever.  Gastrointestinal: Negative  for abdominal pain, constipation, diarrhea, nausea and vomiting.  Genitourinary: Negative for difficulty urinating, dysuria, frequency, hematuria and urgency.  Musculoskeletal: Positive for back pain and gait problem.       Objective:   Physical Exam  Constitutional: He is oriented to person, place, and time. He appears well-developed and well-nourished. No distress.  HENT:  Head: Normocephalic.  Mouth/Throat: Oropharynx is clear and moist. No oropharyngeal exudate.  Eyes: Conjunctivae are normal. No scleral icterus.  Neck: Normal range of motion. Neck supple.  Cardiovascular: Normal rate, regular rhythm, normal heart sounds and intact distal pulses.  Exam reveals no gallop and no friction rub.   No murmur heard. Pulmonary/Chest: Effort normal and breath sounds normal. No respiratory distress. He has no wheezes. He has no rales. He exhibits no tenderness.  Abdominal: Soft. Bowel sounds are normal. He exhibits no distension and no mass. There is no tenderness. There is no rebound and no guarding.  Musculoskeletal:       Right hip: He exhibits decreased range of motion. He exhibits normal strength, no tenderness, no bony tenderness, no swelling, no crepitus, no deformity and no laceration.       Lumbar back: He exhibits decreased range of motion, tenderness and pain. He exhibits no bony tenderness, no swelling, no edema, no deformity, no laceration, no spasm and normal pulse.  Walks with antalgic gait, + pinpoint tenderness bilateral lower back, negative straight leg, normal distal neurovascular exam.   Neurological: He is alert and oriented to person, place, and time.  Skin: Skin is warm and dry. He is  not diaphoretic.  Psychiatric: He has a normal mood and affect. His behavior is normal. Judgment and thought content normal.  Nursing note and vitals reviewed.     Assessment & Plan:   Chronic left-sided low back pain with left-sided sciatica Trigger point injection bilateral, handled  well, no bleeding Has does Pt in the past that did not help, is very interested in dry needling for trigger points, will refer - Ambulatory referral to Physical Therapy - dexamethasone (DECADRON) injection 20 mg; Inject 2 mLs (20 mg total) into the muscle once.

## 2017-06-26 ENCOUNTER — Other Ambulatory Visit: Payer: Self-pay | Admitting: Internal Medicine

## 2017-06-26 ENCOUNTER — Encounter: Payer: Self-pay | Admitting: Internal Medicine

## 2017-06-26 DIAGNOSIS — B002 Herpesviral gingivostomatitis and pharyngotonsillitis: Secondary | ICD-10-CM

## 2017-06-26 MED ORDER — VALACYCLOVIR HCL 500 MG PO TABS: ORAL_TABLET | ORAL | 3 refills | Status: AC

## 2017-07-26 ENCOUNTER — Other Ambulatory Visit: Payer: Self-pay | Admitting: Internal Medicine

## 2017-08-05 ENCOUNTER — Other Ambulatory Visit: Payer: Self-pay | Admitting: Internal Medicine

## 2017-09-04 ENCOUNTER — Ambulatory Visit (INDEPENDENT_AMBULATORY_CARE_PROVIDER_SITE_OTHER): Payer: BLUE CROSS/BLUE SHIELD | Admitting: Physician Assistant

## 2017-09-04 ENCOUNTER — Encounter: Payer: Self-pay | Admitting: Physician Assistant

## 2017-09-04 VITALS — BP 110/80 | HR 62 | Temp 97.7°F | Resp 18 | Ht 66.5 in | Wt 148.2 lb

## 2017-09-04 DIAGNOSIS — M5442 Lumbago with sciatica, left side: Secondary | ICD-10-CM

## 2017-09-04 DIAGNOSIS — G8929 Other chronic pain: Secondary | ICD-10-CM | POA: Diagnosis not present

## 2017-09-04 MED ORDER — DEXAMETHASONE SODIUM PHOSPHATE 100 MG/10ML IJ SOLN
20.0000 mg | Freq: Once | INTRAMUSCULAR | Status: AC
  Administered 2017-09-04: 20 mg via INTRAMUSCULAR

## 2017-09-04 MED ORDER — BACLOFEN 10 MG PO TABS: 10.0000 mg | ORAL_TABLET | Freq: Every day | ORAL | 1 refills | Status: DC

## 2017-09-04 NOTE — Patient Instructions (Signed)
Try the exercises and other information in the back care manual, celebrex once during the day as needed (avoid taking other NSAIDS like Alleve or Ibuprofen while taking this) and then baclofen 1/2 -1  if needed at bedtime for muscle spasm. This can be taken up to every 8 hours, but causes sedation, so should not drive or operate heavy machinery while taking this medicine.   Go to the ER if you have any new weakness in your legs, have trouble controlling your urine or bowels, or have worsening pain.   If you are not better in 1-3 month we will refer you to ortho   Back pain Rehab Ask your health care provider which exercises are safe for you. Do exercises exactly as told by your health care provider and adjust them as directed. It is normal to feel mild stretching, pulling, tightness, or discomfort as you do these exercises, but you should stop right away if you feel sudden pain or your pain gets worse.Do not begin these exercises until told by your health care provider. Stretching and range of motion exercises These exercises warm up your muscles and joints and improve the movement and flexibility of your hips and your back. These exercises also help to relieve pain, numbness, and tingling. Exercise A: Sciatic nerve glide 1. Sit in a chair with your head facing down toward your chest. Place your hands behind your back. Let your shoulders slump forward. 2. Slowly straighten one of your knees while you tilt your head back as if you are looking toward the ceiling. Only straighten your leg as far as you can without making your symptoms worse. 3. Hold for __________ seconds. 4. Slowly return to the starting position. 5. Repeat with your other leg. Repeat __________ times. Complete this exercise __________ times a day. Exercise B: Knee to chest with hip adduction and internal rotation  1. Lie on your back on a firm surface with both legs straight. 2. Bend one of your knees and move it up toward your  chest until you feel a gentle stretch in your lower back and buttock. Then, move your knee toward the shoulder that is on the opposite side from your leg. ? Hold your leg in this position by holding onto the front of your knee. 3. Hold for __________ seconds. 4. Slowly return to the starting position. 5. Repeat with your other leg. Repeat __________ times. Complete this exercise __________ times a day. Exercise C: Prone extension on elbows  1. Lie on your abdomen on a firm surface. A bed may be too soft for this exercise. 2. Prop yourself up on your elbows. 3. Use your arms to help lift your chest up until you feel a gentle stretch in your abdomen and your lower back. ? This will place some of your body weight on your elbows. If this is uncomfortable, try stacking pillows under your chest. ? Your hips should stay down, against the surface that you are lying on. Keep your hip and back muscles relaxed. 4. Hold for __________ seconds. 5. Slowly relax your upper body and return to the starting position. Repeat __________ times. Complete this exercise __________ times a day. Strengthening exercises These exercises build strength and endurance in your back. Endurance is the ability to use your muscles for a long time, even after they get tired. Exercise D: Pelvic tilt 1. Lie on your back on a firm surface. Bend your knees and keep your feet flat. 2. Tense your abdominal muscles. Tip  your pelvis up toward the ceiling and flatten your lower back into the floor. ? To help with this exercise, you may place a small towel under your lower back and try to push your back into the towel. 3. Hold for __________ seconds. 4. Let your muscles relax completely before you repeat this exercise. Repeat __________ times. Complete this exercise __________ times a day. Exercise E: Alternating arm and leg raises  1. Get on your hands and knees on a firm surface. If you are on a hard floor, you may want to use  padding to cushion your knees, such as an exercise mat. 2. Line up your arms and legs. Your hands should be below your shoulders, and your knees should be below your hips. 3. Lift your left leg behind you. At the same time, raise your right arm and straighten it in front of you. ? Do not lift your leg higher than your hip. ? Do not lift your arm higher than your shoulder. ? Keep your abdominal and back muscles tight. ? Keep your hips facing the ground. ? Do not arch your back. ? Keep your balance carefully, and do not hold your breath. 4. Hold for __________ seconds. 5. Slowly return to the starting position and repeat with your right leg and your left arm. Repeat __________ times. Complete this exercise __________ times a day. Posture and body mechanics  Body mechanics refers to the movements and positions of your body while you do your daily activities. Posture is part of body mechanics. Good posture and healthy body mechanics can help to relieve stress in your body's tissues and joints. Good posture means that your spine is in its natural S-curve position (your spine is neutral), your shoulders are pulled back slightly, and your head is not tipped forward. The following are general guidelines for applying improved posture and body mechanics to your everyday activities. Standing   When standing, keep your spine neutral and your feet about hip-width apart. Keep a slight bend in your knees. Your ears, shoulders, and hips should line up.  When you do a task in which you stand in one place for a long time, place one foot up on a stable object that is 2-4 inches (5-10 cm) high, such as a footstool. This helps keep your spine neutral. Sitting   When sitting, keep your spine neutral and keep your feet flat on the floor. Use a footrest, if necessary, and keep your thighs parallel to the floor. Avoid rounding your shoulders, and avoid tilting your head forward.  When working at a desk or a  computer, keep your desk at a height where your hands are slightly lower than your elbows. Slide your chair under your desk so you are close enough to maintain good posture.  When working at a computer, place your monitor at a height where you are looking straight ahead and you do not have to tilt your head forward or downward to look at the screen. Resting   When lying down and resting, avoid positions that are most painful for you.  If you have pain with activities such as sitting, bending, stooping, or squatting (flexion-based activities), lie in a position in which your body does not bend very much. For example, avoid curling up on your side with your arms and knees near your chest (fetal position).  If you have pain with activities such as standing for a long time or reaching with your arms (extension-based activities), lie with your spine  in a neutral position and bend your knees slightly. Try the following positions: ? Lying on your side with a pillow between your knees. ? Lying on your back with a pillow under your knees. Lifting   When lifting objects, keep your feet at least shoulder-width apart and tighten your abdominal muscles.  Bend your knees and hips and keep your spine neutral. It is important to lift using the strength of your legs, not your back. Do not lock your knees straight out.  Always ask for help to lift heavy or awkward objects. This information is not intended to replace advice given to you by your health care provider. Make sure you discuss any questions you have with your health care provider. Document Released: 11/25/2005 Document Revised: 08/01/2016 Document Reviewed: 08/11/2015 Elsevier Interactive Patient Education  [employer].

## 2017-09-04 NOTE — Progress Notes (Signed)
Subjective:    Patient ID: [name] [name], male    DOB: 05-27-1951, 66 y.o.   MRN: [id number]  HPI 66 y.o. WM presents with back pain.  Larey Seat off a ladder in Feb/ march 2016, has seen Dr. Ophelia Charter and Dr. [name] for his back pain in the past, normal MRI. Had MRI back 07/2016. No help from epidural injections but had trigger point injection with pain management that helped. Tried dry needling and PT but it did not help. Last injections were 3 months ago. Tried lyrica/gabapentin that did not help.   Blood pressure 110/80, pulse 62, temperature 97.7 F (36.5 C), resp. rate 18, height 5' 6.5" (1.689 m), weight 148 lb 3.2 oz (67.2 kg), SpO2 97 %.  Medications Current Outpatient Prescriptions on File Prior to Visit  Medication Sig  . amphetamine-dextroamphetamine (ADDERALL) 20 MG tablet   . aspirin 81 MG tablet Take 81 mg by mouth daily.  Marland Kitchen atenolol (TENORMIN) 100 MG tablet TAKE 1 BY MOUTH DAILY  . atorvastatin (LIPITOR) 80 MG tablet Take 1 tablet (80 mg total) by mouth daily.  . celecoxib (CELEBREX) 200 MG capsule TAKE 1 BY MOUTH DAILY  . Cholecalciferol (VITAMIN D PO) Take 5,000 Units by mouth daily.   Marland Kitchen esomeprazole (NEXIUM) 40 MG capsule TAKE ONE CAPSULE BY MOUTH DAILY AT NOON  . finasteride (PROSCAR) 5 MG tablet Take 1 tablet (5 mg total) by mouth daily.  Marland Kitchen lisinopril-hydrochlorothiazide (PRINZIDE,ZESTORETIC) 20-25 MG tablet TAKE 1 TABLET BY MOUTH DAILY  . valACYclovir (VALTREX) 500 MG tablet Take 1 tablet 3 x daily for Fever Blisters   No current facility-administered medications on file prior to visit.     Problem list He has Hypertension; Hyperlipidemia; GERD (gastroesophageal reflux disease); Vitamin D deficiency; Encounter for long-term (current) use of medications; Blood glucose abnormal; Medication management; Insulin resistance; Left hip pain; Avascular necrosis of left femoral head (HCC); Insomnia; Gout; Encounter for general adult medical examination with abnormal findings; and  DDD (degenerative disc disease), lumbosacral on his problem list.   Review of Systems  Constitutional: Negative for chills and fever.  Gastrointestinal: Negative for abdominal pain, constipation, diarrhea, nausea and vomiting.  Genitourinary: Negative for difficulty urinating, dysuria, frequency, hematuria and urgency.  Musculoskeletal: Positive for back pain and gait problem.       Objective:   Physical Exam  Constitutional: He is oriented to person, place, and time. He appears well-developed and well-nourished. No distress.  HENT:  Head: Normocephalic.  Mouth/Throat: Oropharynx is clear and moist. No oropharyngeal exudate.  Eyes: Conjunctivae are normal. No scleral icterus.  Neck: Normal range of motion. Neck supple.  Cardiovascular: Normal rate, regular rhythm, normal heart sounds and intact distal pulses.  Exam reveals no gallop and no friction rub.   No murmur heard. Pulmonary/Chest: Effort normal and breath sounds normal. No respiratory distress. He has no wheezes. He has no rales. He exhibits no tenderness.  Abdominal: Soft. Bowel sounds are normal. He exhibits no distension and no mass. There is no tenderness. There is no rebound and no guarding.  Musculoskeletal:       Right hip: He exhibits decreased range of motion. He exhibits normal strength, no tenderness, no bony tenderness, no swelling, no crepitus, no deformity and no laceration.       Lumbar back: He exhibits decreased range of motion, tenderness and pain. He exhibits no bony tenderness, no swelling, no edema, no deformity, no laceration, no spasm and normal pulse.  Walks with antalgic gait, + pinpoint tenderness  bilateral lower back, negative straight leg, normal distal neurovascular exam.   Neurological: He is alert and oriented to person, place, and time.  Skin: Skin is warm and dry. He is not diaphoretic.  Psychiatric: He has a normal mood and affect. His behavior is normal. Judgment and thought content normal.   Nursing note and vitals reviewed.     Assessment & Plan:   Chronic left-sided low back pain with left-sided sciatica Trigger point injection bilateral, handled well, no bleeding Baclofen PRN - dexamethasone (DECADRON) injection 20 mg; Inject 2 mLs (20 mg total) into the muscle once.

## 2017-09-07 NOTE — Patient Instructions (Signed)

## 2017-09-07 NOTE — Progress Notes (Signed)
[facility]   [name] [name], M.D.     [name] [name]. [name] [name], P.A.-C [name] Gaudier, DNP [facility]                [address]                [place], [place]. [address] Telephone [phone] Telefax [phone] Annual  Screening/Preventative Visit  & Comprehensive Evaluation & Examination     This very nice 66 y.o. DWM presents for a Screening/Preventative Visit & comprehensive evaluation and management of multiple medical co-morbidities.  Patient has been followed for HTN, Prediabetes, Hyperlipidemia and Vitamin D Deficiency. Patient also has GERD controlled on his current meds.      He has Lumbar DDD/sp stenosis followed by Dr [name]  recommend  Conservative management and has had for trigger point injections with Kenalog Dr Murray Hodgkins. More recently he has has trigger point Decadron injections by Quentin Mulling, PA-C     HTN predates circa 1988. Patient's BP has been controlled at home.  Today's BP is at goal - 138/84. Patient denies any cardiac symptoms as chest pain, palpitations, shortness of breath, dizziness or ankle swelling. Patient does c/o a persistent hacking tickle type cough suspected ACEi cough from Lisinopril.      Patient's hyperlipidemia is controlled with diet and medications. Patient denies myalgias or other medication SE's. Last lipids were at goal albeit elev Trig's: Lab Results  Component Value Date   CHOL 193 05/13/2017   HDL 59 05/13/2017   LDLCALC 90 05/13/2017   TRIG 220 (H) 05/13/2017   CHOLHDL 3.3 05/13/2017      Patient is screened expectantly for  Prediabetes with hx/o Insulin Resistance (Insulin 28.6 in Aug 2016 and 33.7 in June 2018) and patient denies reactive hypoglycemic symptoms, visual blurring, diabetic polys or paresthesias. Last A1c was at goal: Lab Results  Component Value Date   HGBA1C 4.9 05/13/2017       Finally, patient has history of Vitamin D Deficiency of "28" in 2008 and  last vitamin D was improved toward goal of 70-100:  Lab Results  Component Value Date   VD25OH 79 05/13/2017   Current Outpatient Prescriptions on File Prior to Visit  Medication Sig  . amphetamine-dextroamphetamine (ADDERALL) 20 MG tablet   . aspirin 81 MG tablet Take 81 mg by mouth daily.  Marland Kitchen atenolol (TENORMIN) 100 MG tablet TAKE 1 BY MOUTH DAILY  . atorvastatin (LIPITOR) 80 MG tablet Take 1 tablet (80 mg total) by mouth daily.  . baclofen (LIORESAL) 10 MG tablet Take 1 tablet (10 mg total) by mouth daily.  . celecoxib (CELEBREX) 200 MG capsule TAKE 1 BY MOUTH DAILY  . Cholecalciferol (VITAMIN D PO) Take 5,000 Units by mouth daily.   Marland Kitchen esomeprazole (NEXIUM) 40 MG capsule TAKE ONE CAPSULE BY MOUTH DAILY AT NOON  . finasteride (PROSCAR) 5 MG tablet Take 1 tablet (5 mg total) by mouth daily.  . valACYclovir (VALTREX) 500 MG tablet Take 1 tablet 3 x daily for Fever Blisters   No current facility-administered medications on file prior to visit.    Not on File Past Medical History:  Diagnosis Date  . Arthritis    right thumb  . Dry eyes    Takes Restasis  . GERD (gastroesophageal reflux disease)   . Hyperlipidemia   . Hypertension   . Vitamin D deficiency    Health Maintenance  Topic Date Due  . Hepatitis C Screening  [birth year]  .  HIV Screening  08/13/1967  . INFLUENZA VACCINE  07/09/2017  . PNA vac Low Risk Adult (1 of 2 - PCV13) 08/12/2017  . COLONOSCOPY  03/11/2018  . TETANUS/TDAP  01/19/2019   Immunization History  Administered Date(s) Administered  . Influenza Split 09/22/2013  . Influenza-Unspecified 09/08/2017  . PPD Test 09/13/2014, 07/25/2015, 08/08/2016, 09/08/2017  . Pneumococcal Polysaccharide-23 01/19/2009  . Tdap 01/19/2009  . Zoster 01/29/2013   Past Surgical History:  Procedure Laterality Date  . COLONOSCOPY W/ POLYPECTOMY    . ESOPHAGOGASTRODUODENOSCOPY  1990  . 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 Osteoporosis Mother   . Schizophrenia Mother   . Heart disease Father   . COPD Father   . Diabetes Brother    Social History   Social History  . Marital status: Divorced    Spouse name: N/A  . Number of children: N/A  . Years of education: N/A   Occupational History  . Not on file.   Social History Main Topics  . Smoking status: Never Smoker  . Smokeless tobacco: Never Used  . Alcohol use Yes     Comment: social  . Drug use: No  . Sexual activity: Not on file   Other Topics Concern  . Not on file   Social History Narrative  . No narrative on file    ROS Constitutional: Denies fever, chills, weight loss/gain, headaches, insomnia,  night sweats or change in appetite. Does c/o fatigue. Eyes: Denies redness, blurred vision, diplopia, discharge, itchy or watery eyes.  ENT: Denies discharge, congestion, post nasal drip, epistaxis, sore throat, earache, hearing loss, dental pain, Tinnitus, Vertigo, Sinus pain or snoring.  Cardio: Denies chest pain, palpitations, irregular heartbeat, syncope, dyspnea, diaphoresis, orthopnea, PND, claudication or edema Respiratory: denies cough, dyspnea, DOE, pleurisy, hoarseness, laryngitis or wheezing.  Gastrointestinal: Denies dysphagia, heartburn, reflux, water brash, pain, cramps, nausea, vomiting, bloating, diarrhea, constipation, hematemesis, melena, hematochezia, jaundice or hemorrhoids Genitourinary: Denies dysuria, frequency, urgency, nocturia, hesitancy, discharge, hematuria or flank pain Musculoskeletal: Denies arthralgia, myalgia, stiffness, Jt. Swelling, pain, limp or strain/sprain. Denies Falls. Skin: Denies puritis, rash, hives, warts, acne, eczema or change in skin lesion Neuro: No weakness, tremor, incoordination, spasms, paresthesia or pain Psychiatric: Denies confusion, memory  loss or sensory loss. Denies Depression. Endocrine: Denies change in weight, skin, hair change, nocturia, and paresthesia, diabetic polys, visual blurring or hyper / hypo glycemic episodes.  Heme/Lymph: No excessive bleeding, bruising or enlarged lymph nodes.  Physical Exam  BP 138/84   Pulse 64   Temp (!) 97.3 F (36.3 C)   Resp 18   Ht  (1.702 m)   Wt 150 lb 3.2 oz (68.1 kg)   BMI 23.52 kg/m   General Appearance: Well nourished and well groomed and in no apparent distress.  Eyes: PERRLA, EOMs, conjunctiva no swelling or erythema, normal fundi and vessels. Sinuses: No frontal/maxillary tenderness ENT/Mouth: EACs patent / TMs  nl. Nares clear without erythema, swelling, mucoid exudates. Oral hygiene is good. No erythema, swelling, or exudate. Tongue normal, non-obstructing. Tonsils not swollen or erythematous. Hearing normal.  Neck: Supple, thyroid normal. No bruits, nodes or JVD. Respiratory: Respiratory effort normal.  BS equal and clear bilateral without rales, rhonci, wheezing or stridor. Cardio: Heart sounds are normal with regular rate and rhythm and no murmurs, rubs  or gallops. Peripheral pulses are normal and equal bilaterally without edema. No aortic or femoral bruits. Chest: symmetric with normal excursions and percussion.  Abdomen: Soft, with Nl bowel sounds. Nontender, no guarding, rebound, hernias, masses, or organomegaly.  Lymphatics: Non tender without lymphadenopathy.  Genitourinary: No hernias.Testes nl. DRE - prostate nl for age - smooth & firm w/o nodules. Musculoskeletal: Full ROM all peripheral extremities, joint stability, 5/5 strength, and normal gait. Skin: Warm and dry without rashes, lesions, cyanosis, clubbing or  ecchymosis.  Neuro: Cranial nerves intact, reflexes equal bilaterally. Normal muscle tone, no cerebellar symptoms. Sensation intact.  Pysch: Alert and oriented X 3 with normal affect, insight and judgment appropriate.   Assessment and  Plan  1. Annual Preventative/Screening Exam   1. Encounter for general adult medical examination with abnormal findings  2. Essential hypertension  - EKG 12-Lead - [place], RETROPERITNL ABD,  LTD - Urinalysis, Routine w reflex microscopic - Microalbumin / creatinine urine ratio - CBC with Differential/Platelet - BASIC METABOLIC PANEL WITH GFR - Magnesium - TSH  - d/c Lisinopril for suspected "ACEi" cough  - losartan-hydrochlorothiazide (HYZAAR) 100-25 MG tablet; Take 1/2 to 1 tablet daily as directed for BP  Dispense: 90 tablet; Refill: 1  3. Hyperlipidemia, mixed  - EKG 12-Lead - [place], RETROPERITNL ABD,  LTD - Hepatic function panel - Lipid panel - TSH  4. Insulin resistance  - EKG 12-Lead - [place], RETROPERITNL ABD,  LTD - Hemoglobin A1c - Insulin, random  5. Vitamin D deficiency  - VITAMIN D 25 Hydroxy  6. Gastroesophageal reflux disease  - CBC with Differential/Platelet  7. Screening for colorectal cancer   8. Screening for prostate cancer  - PSA  9. Screening for ischemic heart disease  - EKG 12-Lead  10. Screening for AAA (aortic abdominal aneurysm)  - [place], RETROPERITNL ABD,  LTD  11. Fatigue, unspecified type  - Iron,Total/Total Iron Binding Cap - Vitamin B12 - Testosterone - TSH  12. Medication management  - Urinalysis, Routine w reflex microscopic - Microalbumin / creatinine urine ratio - CBC with Differential/Platelet - BASIC METABOLIC PANEL WITH GFR - Hepatic function panel - Magnesium - Lipid panel - TSH - Hemoglobin A1c - Insulin, random - VITAMIN D 25 Hydroxy  13. Screening examination for pulmonary tuberculosis  - PPD      Patient was counseled in prudent diet, weight control to achieve/maintain BMI less than 25, BP monitoring, regular exercise and medications as discussed.  Discussed med effects and SE's. Routine screening labs and tests as requested with regular follow-up as recommended. Over 40 minutes of exam, counseling,  chart review and high complex critical decision making was performed

## 2017-09-08 ENCOUNTER — Other Ambulatory Visit: Payer: Self-pay | Admitting: Internal Medicine

## 2017-09-08 ENCOUNTER — Encounter: Payer: Self-pay | Admitting: Internal Medicine

## 2017-09-08 ENCOUNTER — Ambulatory Visit (INDEPENDENT_AMBULATORY_CARE_PROVIDER_SITE_OTHER): Payer: BLUE CROSS/BLUE SHIELD | Admitting: Internal Medicine

## 2017-09-08 VITALS — BP 138/84 | HR 64 | Temp 97.3°F | Resp 18 | Ht 67.0 in | Wt 150.2 lb

## 2017-09-08 DIAGNOSIS — Z1212 Encounter for screening for malignant neoplasm of rectum: Secondary | ICD-10-CM

## 2017-09-08 DIAGNOSIS — E88819 Insulin resistance, unspecified: Secondary | ICD-10-CM

## 2017-09-08 DIAGNOSIS — Z125 Encounter for screening for malignant neoplasm of prostate: Secondary | ICD-10-CM | POA: Diagnosis not present

## 2017-09-08 DIAGNOSIS — Z0000 Encounter for general adult medical examination without abnormal findings: Secondary | ICD-10-CM | POA: Diagnosis not present

## 2017-09-08 DIAGNOSIS — Z111 Encounter for screening for respiratory tuberculosis: Secondary | ICD-10-CM | POA: Diagnosis not present

## 2017-09-08 DIAGNOSIS — I10 Essential (primary) hypertension: Secondary | ICD-10-CM | POA: Diagnosis not present

## 2017-09-08 DIAGNOSIS — E782 Mixed hyperlipidemia: Secondary | ICD-10-CM

## 2017-09-08 DIAGNOSIS — E559 Vitamin D deficiency, unspecified: Secondary | ICD-10-CM

## 2017-09-08 DIAGNOSIS — Z136 Encounter for screening for cardiovascular disorders: Secondary | ICD-10-CM | POA: Diagnosis not present

## 2017-09-08 DIAGNOSIS — E8881 Metabolic syndrome: Secondary | ICD-10-CM

## 2017-09-08 DIAGNOSIS — Z0001 Encounter for general adult medical examination with abnormal findings: Secondary | ICD-10-CM

## 2017-09-08 DIAGNOSIS — R5383 Other fatigue: Secondary | ICD-10-CM

## 2017-09-08 DIAGNOSIS — Z79899 Other long term (current) drug therapy: Secondary | ICD-10-CM

## 2017-09-08 DIAGNOSIS — K219 Gastro-esophageal reflux disease without esophagitis: Secondary | ICD-10-CM

## 2017-09-08 DIAGNOSIS — E291 Testicular hypofunction: Secondary | ICD-10-CM

## 2017-09-08 DIAGNOSIS — Z1211 Encounter for screening for malignant neoplasm of colon: Secondary | ICD-10-CM

## 2017-09-08 MED ORDER — LORAZEPAM 1 MG PO TABS: ORAL_TABLET | ORAL | 1 refills | Status: AC

## 2017-09-08 MED ORDER — LOSARTAN POTASSIUM-HCTZ 100-25 MG PO TABS: ORAL_TABLET | ORAL | 1 refills | Status: DC

## 2017-09-09 ENCOUNTER — Ambulatory Visit: Payer: Self-pay | Admitting: Physician Assistant

## 2017-09-09 LAB — CBC WITH DIFFERENTIAL/PLATELET
Basophils Absolute: 16 cells/uL (ref 0–200)
Basophils Relative: 0.2 %
Eosinophils Absolute: 57 cells/uL (ref 15–500)
Eosinophils Relative: 0.7 %
HCT: 43.5 % (ref 38.5–50.0)
Hemoglobin: 15.2 g/dL (ref 13.2–17.1)
Lymphs Abs: 1166 cells/uL (ref 850–3900)
MCH: 34.9 pg — ABNORMAL HIGH (ref 27.0–33.0)
MCHC: 34.9 g/dL (ref 32.0–36.0)
MCV: 99.8 fL (ref 80.0–100.0)
MPV: 9.6 fL (ref 7.5–12.5)
Monocytes Relative: 11.2 %
Neutro Abs: 5954 cells/uL (ref 1500–7800)
Neutrophils Relative %: 73.5 %
Platelets: 228 10*3/uL (ref 140–400)
RBC: 4.36 10*6/uL (ref 4.20–5.80)
RDW: 11.8 % (ref 11.0–15.0)
Total Lymphocyte: 14.4 %
WBC mixed population: 907 cells/uL (ref 200–950)
WBC: 8.1 10*3/uL (ref 3.8–10.8)

## 2017-09-09 LAB — MAGNESIUM: Magnesium: 2.3 mg/dL (ref 1.5–2.5)

## 2017-09-09 LAB — MICROALBUMIN / CREATININE URINE RATIO
Creatinine, Urine: 234 mg/dL (ref 20–320)
Microalb Creat Ratio: 4 mcg/mg creat (ref <30)
Microalb, Ur: 1 mg/dL

## 2017-09-09 LAB — TESTOSTERONE: Testosterone: 385 ng/dL (ref 250–827)

## 2017-09-09 LAB — PSA: PSA: 0.4 ng/mL (ref <=4.0)

## 2017-09-09 LAB — BASIC METABOLIC PANEL WITH GFR
BUN/Creatinine Ratio: 22 (calc) (ref 6–22)
BUN: 26 mg/dL — ABNORMAL HIGH (ref 7–25)
CO2: 30 mmol/L (ref 20–32)
Calcium: 9.3 mg/dL (ref 8.6–10.3)
Chloride: 98 mmol/L (ref 98–110)
Creat: 1.19 mg/dL (ref 0.70–1.25)
GFR, Est African American: 74 mL/min/{1.73_m2} (ref >=60)
GFR, Est Non African American: 64 mL/min/{1.73_m2} (ref >=60)
Glucose, Bld: 119 mg/dL — ABNORMAL HIGH (ref 65–99)
Potassium: 4.2 mmol/L (ref 3.5–5.3)
Sodium: 137 mmol/L (ref 135–146)

## 2017-09-09 LAB — URINALYSIS, ROUTINE W REFLEX MICROSCOPIC
Bilirubin Urine: NEGATIVE
Glucose, UA: NEGATIVE
Hgb urine dipstick: NEGATIVE
Ketones, ur: NEGATIVE
Leukocytes, UA: NEGATIVE
Nitrite: NEGATIVE
Protein, ur: NEGATIVE
Specific Gravity, Urine: 1.029 (ref 1.001–1.03)
pH: 6.5 (ref 5.0–8.0)

## 2017-09-09 LAB — LIPID PANEL
Cholesterol: 203 mg/dL — ABNORMAL HIGH (ref <200)
HDL: 72 mg/dL (ref >40)
LDL Cholesterol (Calc): 96 mg/dL (calc)
Non-HDL Cholesterol (Calc): 131 mg/dL (calc) — ABNORMAL HIGH (ref <130)
Total CHOL/HDL Ratio: 2.8 (calc) (ref <5.0)
Triglycerides: 248 mg/dL — ABNORMAL HIGH (ref <150)

## 2017-09-09 LAB — HEPATIC FUNCTION PANEL
AG Ratio: 2 (calc) (ref 1.0–2.5)
ALT: 33 U/L (ref 9–46)
AST: 24 U/L (ref 10–35)
Albumin: 4.4 g/dL (ref 3.6–5.1)
Alkaline phosphatase (APISO): 53 U/L (ref 40–115)
Bilirubin, Direct: 0.2 mg/dL (ref 0.0–0.2)
Globulin: 2.2 g/dL (calc) (ref 1.9–3.7)
Indirect Bilirubin: 0.9 mg/dL (calc) (ref 0.2–1.2)
Total Bilirubin: 1.1 mg/dL (ref 0.2–1.2)
Total Protein: 6.6 g/dL (ref 6.1–8.1)

## 2017-09-09 LAB — HEMOGLOBIN A1C
Hgb A1c MFr Bld: 4.8 % of total Hgb (ref <5.7)
Mean Plasma Glucose: 91 (calc)
eAG (mmol/L): 5 (calc)

## 2017-09-09 LAB — IRON, TOTAL/TOTAL IRON BINDING CAP
%SAT: 80 % (calc) — ABNORMAL HIGH (ref 15–60)
Iron: 284 ug/dL — ABNORMAL HIGH (ref 50–180)
TIBC: 354 mcg/dL (calc) (ref 250–425)

## 2017-09-09 LAB — TSH: TSH: 1.09 mIU/L (ref 0.40–4.50)

## 2017-09-09 LAB — INSULIN, RANDOM: Insulin: 28.3 u[IU]/mL — ABNORMAL HIGH (ref 2.0–19.6)

## 2017-09-09 LAB — VITAMIN D 25 HYDROXY (VIT D DEFICIENCY, FRACTURES): Vit D, 25-Hydroxy: 45 ng/mL (ref 30–100)

## 2017-09-09 LAB — VITAMIN B12: Vitamin B-12: 281 pg/mL (ref 200–1100)

## 2017-10-16 ENCOUNTER — Other Ambulatory Visit: Payer: Self-pay | Admitting: Internal Medicine

## 2017-10-29 ENCOUNTER — Other Ambulatory Visit: Payer: Self-pay | Admitting: *Deleted

## 2017-10-29 DIAGNOSIS — I10 Essential (primary) hypertension: Secondary | ICD-10-CM

## 2017-10-29 MED ORDER — LOSARTAN POTASSIUM-HCTZ 100-25 MG PO TABS: ORAL_TABLET | ORAL | 0 refills | Status: DC

## 2017-12-16 NOTE — Progress Notes (Signed)
FOLLOW UP  Assessment and Plan:   Hypertension Well controlled with current medications  Monitor blood pressure at home; patient to call if consistently greater than 130/80 Continue DASH diet.   Reminder to go to the ER if any CP, SOB, nausea, dizziness, severe HA, changes vision/speech, left arm numbness and tingling and jaw pain.  [facility] continue stat[facility] - triglycerides discussed Continue low cholesterol diet and exercise.  Check lipid panel.   Other abnormal glucose Elevated glucose and insulin levels without A1C abnormalities Continue diet and exercise.  Perform daily foot/skin check, notify office of any concerning changes.  Defer A1C; check bmp  Vitamin D Def Below goal at last visit; continue supplementation to maintain goal of 70-100 Defer Vit D level  Chronic lower back pain Managed by q3 month trigger point injections Verbal consent obtained, risk and benefits were addressed with patient. A trigger point injection was performed at the site of maximal tenderness using 1% plain Lidocaine and Dexamethasone. This was well tolerated, and followed by immediate relief of pain. Return precautions discussed with the patient.   Continue diet and meds as discussed. Further disposition pending results of labs. Discussed med's effects and SE's.   Over 30 minutes of exam, counseling, chart review, and critical decision making was performed.   Future Appointments  Date Time Provider Department Center  03/24/2018  3:30 PM Lucky CowboyMcKeown, William, MD [facility] None  10/09/2018 10:00 AM Lucky CowboyMcKeown, William, MD [facility] None    ----------------------------------------------------------------------------------------------------------------------  HPI 67 y.o. male  presents for 3 month follow up on hypertension, cholesterol, glucose management, weight monitoring and vitamin D deficiency. He is also requesting trigger point injection for chronic back pain ongoing after  falling off of a ladder in 2016; normal MRI, has been evaluated by Dr. Ophelia CharterYates and Yetta BarreJones. Epidural injections, dry needling, PT, lyrica/gabapentin have been ineffective in the past. He did have success with trigger point injections initiated by pain management. Last injection was 3 months ago.    BMI is Body mass index is 24.43 kg/m., he has been working on diet and exercise. Wt Readings from Last 3 Encounters:  12/17/17 156 lb (70.8 kg)  09/08/17 150 lb 3.2 oz (68.1 kg)  09/04/17 148 lb 3.2 oz (67.2 kg)   His blood pressure has been controlled at home, today their BP is BP: 120/72  He does workout. He denies chest pain, shortness of breath, dizziness.   He is on cholesterol medication and denies myalgias. His LDL cholesterol is at goal; triglycerides remain elevated. The cholesterol last visit was:   Lab Results  Component Value Date   CHOL 203 (H) 09/08/2017   HDL 72 09/08/2017   LDLCALC 90 05/13/2017   TRIG 248 (H) 09/08/2017   CHOLHDL 2.8 09/08/2017    He has been working on diet and exercise for glucose management, and denies foot ulcerations, nausea, polydipsia, polyuria, visual disturbances, vomiting and weight loss. Last A1C in the office was:  Lab Results  Component Value Date   HGBA1C 4.8 09/08/2017   Patient is on Vitamin D supplement but remained below goal at the last visit:   Lab Results  Component Value Date   VD25OH 45 09/08/2017       Current Medications:  Current Outpatient Medications on File Prior to Visit  Medication Sig  . amphetamine-dextroamphetamine (ADDERALL) 20 MG tablet   . aspirin 81 MG tablet Take 81 mg by mouth daily.  Marland Kitchen. atorvastatin (LIPITOR) 80 MG tablet Take 1 tablet (80 mg total)  by mouth daily.  . celecoxib (CELEBREX) 200 MG capsule TAKE ONE CAPSULE BY MOUTH DAILY  . Cholecalciferol (VITAMIN D PO) Take 5,000 Units by mouth daily.   Marland Kitchen esomeprazole (NEXIUM) 40 MG capsule TAKE ONE CAPSULE BY MOUTH DAILY AT NOON  . finasteride (PROSCAR) 5 MG  tablet Take 1 tablet (5 mg total) by mouth daily.  Marland Kitchen LORazepam (ATIVAN) 1 MG tablet Take 1/2 to 1 tablet at hour of sleep  . valACYclovir (VALTREX) 500 MG tablet Take 1 tablet 3 x daily for Fever Blisters  . baclofen (LIORESAL) 10 MG tablet Take 1 tablet (10 mg total) by mouth daily. (Patient not taking: Reported on 12/17/2017)   No current facility-administered medications on file prior to visit.      Allergies: No Known Allergies   Medical History:  Past Medical History:  Diagnosis Date  . Arthritis    right thumb  . Dry eyes    Takes Restasis  . GERD (gastroesophageal reflux disease)   . Hyperlipidemia   . Hypertension   . Vitamin D deficiency    Family history- Reviewed and unchanged Social history- Reviewed and unchanged   Review of Systems:  Review of Systems  Constitutional: Negative for malaise/fatigue and weight loss.  HENT: Negative for hearing loss and tinnitus.   Eyes: Negative for blurred vision and double vision.  Respiratory: Negative for cough, shortness of breath and wheezing.   Cardiovascular: Negative for chest pain, palpitations, orthopnea, claudication and leg swelling.  Gastrointestinal: Negative for abdominal pain, blood in stool, constipation, diarrhea, heartburn, melena, nausea and vomiting.  Genitourinary: Negative.   Musculoskeletal: Positive for back pain. Negative for joint pain and myalgias.  Skin: Negative for rash.  Neurological: Positive for focal weakness. Negative for dizziness, tingling, sensory change, weakness and headaches.  Endo/Heme/Allergies: Negative for polydipsia.  Psychiatric/Behavioral: Negative.   All other systems reviewed and are negative.   Physical Exam: BP 120/72   Pulse (!) 55   Temp (!) 97.5 F (36.4 C)   Ht 5\' 7"  (1.702 m)   Wt 156 lb (70.8 kg)   SpO2 96%   BMI 24.43 kg/m  Wt Readings from Last 3 Encounters:  12/17/17 156 lb (70.8 kg)  09/08/17 150 lb 3.2 oz (68.1 kg)  09/04/17 148 lb 3.2 oz (67.2 kg)    General Appearance: Well nourished, in no apparent distress. Eyes: PERRLA, EOMs, conjunctiva no swelling or erythema Sinuses: No Frontal/maxillary tenderness ENT/Mouth: Ext aud canals clear, TMs without erythema, bulging. No erythema, swelling, or exudate on post pharynx.  Tonsils not swollen or erythematous. Hearing normal.  Neck: Supple, thyroid normal.  Respiratory: Respiratory effort normal, BS equal bilaterally without rales, rhonchi, wheezing or stridor.  Cardio: RRR with no MRGs. Brisk peripheral pulses without edema.  Abdomen: Soft, + BS.  Non tender, no guarding, rebound, hernias, masses. Lymphatics: Non tender without lymphadenopathy.  Musculoskeletal: Full ROM (pain with ROM in lumbar), 5/5 strength, Normal gait Skin: Warm, dry without rashes, lesions, ecchymosis.  Neuro: Cranial nerves intact. No cerebellar symptoms.  Psych: Awake and oriented X 3, normal affect, Insight and Judgment appropriate.    Dan Maker, NP 3:49 PM [facility]

## 2017-12-17 ENCOUNTER — Encounter: Payer: Self-pay | Admitting: Adult Health

## 2017-12-17 ENCOUNTER — Ambulatory Visit: Payer: BLUE CROSS/BLUE SHIELD | Admitting: Adult Health

## 2017-12-17 VITALS — BP 120/72 | HR 55 | Temp 97.5°F | Ht 67.0 in | Wt 156.0 lb

## 2017-12-17 DIAGNOSIS — M545 Low back pain: Secondary | ICD-10-CM | POA: Diagnosis not present

## 2017-12-17 DIAGNOSIS — R7309 Other abnormal glucose: Secondary | ICD-10-CM

## 2017-12-17 DIAGNOSIS — E559 Vitamin D deficiency, unspecified: Secondary | ICD-10-CM

## 2017-12-17 DIAGNOSIS — E785 Hyperlipidemia, unspecified: Secondary | ICD-10-CM | POA: Diagnosis not present

## 2017-12-17 DIAGNOSIS — G8929 Other chronic pain: Secondary | ICD-10-CM

## 2017-12-17 DIAGNOSIS — Z79899 Other long term (current) drug therapy: Secondary | ICD-10-CM | POA: Diagnosis not present

## 2017-12-17 DIAGNOSIS — I10 Essential (primary) hypertension: Secondary | ICD-10-CM

## 2017-12-17 MED ORDER — ATENOLOL 100 MG PO TABS: ORAL_TABLET | ORAL | 1 refills | Status: DC

## 2017-12-17 MED ORDER — DEXAMETHASONE SODIUM PHOSPHATE 10 MG/ML IJ SOLN
20.0000 mg | Freq: Once | INTRAMUSCULAR | Status: AC
  Administered 2017-12-17: 20 mg via INTRAMUSCULAR

## 2017-12-17 MED ORDER — LOSARTAN POTASSIUM-HCTZ 100-25 MG PO TABS: ORAL_TABLET | ORAL | 0 refills | Status: DC

## 2017-12-17 NOTE — Patient Instructions (Signed)
Go to the ER if you have any new weakness in your legs, have trouble controlling your urine or bowels, or have worsening pain.    Trigger Point Injection Trigger points are areas where you have pain. A trigger point injection is a shot given in the trigger point to help relieve pain for a few days to a few months. Common places for trigger points include:  The neck.  The shoulders.  The upper back.  The lower back.  A trigger point injection will not cure long-lasting (chronic) pain permanently. These injections do not always work for every person, but for some people they can help to relieve pain for a few days to a few months. Tell a health care provider about:  Any allergies you have.  All medicines you are taking, including vitamins, herbs, eye drops, creams, and over-the-counter medicines.  Any problems you or family members have had with anesthetic medicines.  Any blood disorders you have.  Any surgeries you have had.  Any medical conditions you have. What are the risks? Generally, this is a safe procedure. However, problems may occur, including:  Infection.  Bleeding.  Allergic reaction to the injected medicine.  Irritation of the skin around the injection site.  What happens before the procedure?  Ask your health care provider about changing or stopping your regular medicines. This is especially important if you are taking diabetes medicines or blood thinners. What happens during the procedure?  Your health care provider will feel for trigger points. A marker may be used to circle the area for the injection.  The skin over the trigger point will be washed with a germ-killing (antiseptic) solution.  A thin needle is used for the shot. You may feel pain or a twitching feeling when the needle enters the trigger point.  A numbing solution may be injected into the trigger point. Sometimes a medicine to keep down swelling, redness, and warmth (inflammation) is also  injected.  Your health care provider may move the needle around the area where the trigger point is located until the tightness and twitching goes away.  After the injection, your health care provider may put gentle pressure over the injection site.  The injection site will be covered with a bandage (dressing). The procedure may vary among health care providers and hospitals. What happens after the procedure?  The dressing can be taken off in a few hours or as told by your health care provider.  You may feel sore and stiff for 1-2 days. This information is not intended to replace advice given to you by your health care provider. Make sure you discuss any questions you have with your health care provider. Document Released: 11/14/2011 Document Revised: 07/28/2016 Document Reviewed: 05/15/2015 Elsevier Interactive Patient Education  2018 [employer]

## 2017-12-18 ENCOUNTER — Other Ambulatory Visit: Payer: Self-pay | Admitting: Adult Health

## 2017-12-18 DIAGNOSIS — N179 Acute kidney failure, unspecified: Secondary | ICD-10-CM

## 2017-12-18 LAB — BASIC METABOLIC PANEL WITH GFR
BUN/Creatinine Ratio: 15 (calc) (ref 6–22)
BUN: 38 mg/dL — ABNORMAL HIGH (ref 7–25)
CO2: 30 mmol/L (ref 20–32)
Calcium: 9.7 mg/dL (ref 8.6–10.3)
Chloride: 104 mmol/L (ref 98–110)
Creat: 2.5 mg/dL — ABNORMAL HIGH (ref 0.70–1.25)
GFR, Est African American: 30 mL/min/{1.73_m2} — ABNORMAL LOW (ref >=60)
GFR, Est Non African American: 26 mL/min/{1.73_m2} — ABNORMAL LOW (ref >=60)
Glucose, Bld: 92 mg/dL (ref 65–99)
Potassium: 4.5 mmol/L (ref 3.5–5.3)
Sodium: 144 mmol/L (ref 135–146)

## 2017-12-18 LAB — CBC WITH DIFFERENTIAL/PLATELET
Basophils Absolute: 69 cells/uL (ref 0–200)
Basophils Relative: 0.9 %
Eosinophils Absolute: 39 cells/uL (ref 15–500)
Eosinophils Relative: 0.5 %
HCT: 38.4 % — ABNORMAL LOW (ref 38.5–50.0)
Hemoglobin: 13.8 g/dL (ref 13.2–17.1)
Lymphs Abs: 1509 cells/uL (ref 850–3900)
MCH: 34.7 pg — ABNORMAL HIGH (ref 27.0–33.0)
MCHC: 35.9 g/dL (ref 32.0–36.0)
MCV: 96.5 fL (ref 80.0–100.0)
MPV: 10.1 fL (ref 7.5–12.5)
Monocytes Relative: 12.8 %
Neutro Abs: 5097 cells/uL (ref 1500–7800)
Neutrophils Relative %: 66.2 %
Platelets: 241 10*3/uL (ref 140–400)
RBC: 3.98 10*6/uL — ABNORMAL LOW (ref 4.20–5.80)
RDW: 11.7 % (ref 11.0–15.0)
Total Lymphocyte: 19.6 %
WBC mixed population: 986 cells/uL — ABNORMAL HIGH (ref 200–950)
WBC: 7.7 10*3/uL (ref 3.8–10.8)

## 2017-12-18 LAB — LIPID PANEL
Cholesterol: 203 mg/dL — ABNORMAL HIGH (ref <200)
HDL: 57 mg/dL (ref >40)
LDL Cholesterol (Calc): 111 mg/dL (calc) — ABNORMAL HIGH
Non-HDL Cholesterol (Calc): 146 mg/dL (calc) — ABNORMAL HIGH (ref <130)
Total CHOL/HDL Ratio: 3.6 (calc) (ref <5.0)
Triglycerides: 229 mg/dL — ABNORMAL HIGH (ref <150)

## 2017-12-18 LAB — HEPATIC FUNCTION PANEL
AG Ratio: 1.8 (calc) (ref 1.0–2.5)
ALT: 18 U/L (ref 9–46)
AST: 19 U/L (ref 10–35)
Albumin: 4.4 g/dL (ref 3.6–5.1)
Alkaline phosphatase (APISO): 61 U/L (ref 40–115)
Bilirubin, Direct: 0.1 mg/dL (ref 0.0–0.2)
Globulin: 2.5 g/dL (calc) (ref 1.9–3.7)
Indirect Bilirubin: 0.5 mg/dL (calc) (ref 0.2–1.2)
Total Bilirubin: 0.6 mg/dL (ref 0.2–1.2)
Total Protein: 6.9 g/dL (ref 6.1–8.1)

## 2017-12-18 LAB — TSH: TSH: 2.08 mIU/L (ref 0.40–4.50)

## 2018-01-05 ENCOUNTER — Encounter: Payer: Self-pay | Admitting: *Deleted

## 2018-01-08 ENCOUNTER — Other Ambulatory Visit: Payer: Self-pay | Admitting: Internal Medicine

## 2018-01-12 ENCOUNTER — Other Ambulatory Visit: Payer: BLUE CROSS/BLUE SHIELD

## 2018-01-12 DIAGNOSIS — N179 Acute kidney failure, unspecified: Secondary | ICD-10-CM

## 2018-01-12 LAB — BASIC METABOLIC PANEL WITH GFR
BUN/Creatinine Ratio: 14 (calc) (ref 6–22)
BUN: 30 mg/dL — ABNORMAL HIGH (ref 7–25)
CO2: 28 mmol/L (ref 20–32)
Calcium: 9.4 mg/dL (ref 8.6–10.3)
Chloride: 100 mmol/L (ref 98–110)
Creat: 2.1 mg/dL — ABNORMAL HIGH (ref 0.70–1.25)
GFR, Est African American: 37 mL/min/{1.73_m2} — ABNORMAL LOW (ref >=60)
GFR, Est Non African American: 32 mL/min/{1.73_m2} — ABNORMAL LOW (ref >=60)
Glucose, Bld: 97 mg/dL (ref 65–99)
Potassium: 4 mmol/L (ref 3.5–5.3)
Sodium: 138 mmol/L (ref 135–146)

## 2018-01-13 ENCOUNTER — Other Ambulatory Visit: Payer: Self-pay | Admitting: Adult Health

## 2018-01-13 DIAGNOSIS — N289 Disorder of kidney and ureter, unspecified: Secondary | ICD-10-CM

## 2018-01-15 ENCOUNTER — Other Ambulatory Visit: Payer: Self-pay | Admitting: Adult Health

## 2018-01-15 ENCOUNTER — Telehealth: Payer: Self-pay | Admitting: Adult Health

## 2018-01-15 DIAGNOSIS — M545 Low back pain: Principal | ICD-10-CM

## 2018-01-15 DIAGNOSIS — G8929 Other chronic pain: Secondary | ICD-10-CM

## 2018-01-15 NOTE — Telephone Encounter (Signed)
PATIENT requesting referral and rx for massage therapy, insurance will cover if provider orders and [facility] . Please advise. 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

## 2018-01-15 NOTE — Telephone Encounter (Signed)
Order entered as requested. Let me know if further follow up is needed.

## 2018-01-19 ENCOUNTER — Encounter: Payer: Self-pay | Admitting: Adult Health

## 2018-01-19 DIAGNOSIS — N183 Chronic kidney disease, stage 3 unspecified: Secondary | ICD-10-CM | POA: Insufficient documentation

## 2018-01-22 ENCOUNTER — Ambulatory Visit: Payer: BLUE CROSS/BLUE SHIELD | Admitting: Internal Medicine

## 2018-01-22 ENCOUNTER — Encounter: Payer: Self-pay | Admitting: Internal Medicine

## 2018-01-22 VITALS — BP 114/62 | HR 64 | Temp 97.3°F | Resp 16 | Ht 67.0 in | Wt 158.0 lb

## 2018-01-22 DIAGNOSIS — G8929 Other chronic pain: Secondary | ICD-10-CM

## 2018-01-22 DIAGNOSIS — M5441 Lumbago with sciatica, right side: Secondary | ICD-10-CM | POA: Diagnosis not present

## 2018-01-22 DIAGNOSIS — Z23 Encounter for immunization: Secondary | ICD-10-CM

## 2018-01-22 DIAGNOSIS — Z79899 Other long term (current) drug therapy: Secondary | ICD-10-CM | POA: Diagnosis not present

## 2018-01-22 DIAGNOSIS — G5711 Meralgia paresthetica, right lower limb: Secondary | ICD-10-CM

## 2018-01-22 DIAGNOSIS — M5411 Radiculopathy, occipito-atlanto-axial region: Secondary | ICD-10-CM | POA: Diagnosis not present

## 2018-01-22 DIAGNOSIS — N289 Disorder of kidney and ureter, unspecified: Secondary | ICD-10-CM | POA: Diagnosis not present

## 2018-01-22 MED ORDER — GABAPENTIN 100 MG PO CAPS: ORAL_CAPSULE | ORAL | 2 refills | Status: DC

## 2018-01-22 MED ORDER — TRAMADOL HCL 50 MG PO TABS: ORAL_TABLET | ORAL | 0 refills | Status: DC

## 2018-01-22 NOTE — Patient Instructions (Signed)
Lateral Femoral Cutaneous Nerve Block Patient Information  Description: The lateral femoral cutaneous nerve of the thigh is a purely sensory nerve that can become entrapped or irritated for a number of reasons.  The pain associated with this condition is called meralgia paraesthetica.  Patients affected with this syndrome have burning pain or abnormal sensation along the lateral aspect of the thigh.  The pain can be worsened by prolonged walking, standing, or constrictive garments around the house.   The diagnosis can be confirmed and treatment initiated by blocking the nerve with local anesthetic (like Novocaine).  At times, a steroid solution may be injected at the same time.  The site of injection is through a tiny needle in the left, lower quadrant of the abdomen.   The entire block usually lasts less than 5 minutes.  Conditions which may be treated by lateral femoral cutaneous nerve block:   Meralagia paraesthetica  Preparation for the injection:  1. Do not eat any solid food or dairy products within 8 hours of your appointment.  2. You may drink clear liquids up to 3 hours before appointment.  Clear liquids include water, black coffee, juice or soda. No milk or cream please. 3. You may take your regular medication, including pain medications, with a sip of water before your appointment.  Diabetics should hold regular insulin (if taken separately) and take 1/2 normal NPH dose the morning of the procedure.  Carry some sugar containing items with you to your appointment. 4. A driver must accompany you and be prepared to drive you home after your procedure. 5. Bring all you current medications with you 6. An IV may be inserted and sedation may be given at the discretion of the physician. 7. A blood pressure cuff, EKG and other monitors will often be applied during the procedure.  Some patients may need to have extra oxygen administered for a short period. 8. You will be asked to provide medical  information, including your allergies and medications, prior to the procedure.  We must know immediately if you are taking blood thinners (like Coumadin/Warfarin) or if you allergic to IV iodine contrast (dye)  We must know if you could possible be pregnant.   Possible side-effects:   Bleeding from needle site  Infection (rate, may require surgery)  Nerve injury (rare)  Numbness and Tingling (temporary)  Light-headedness (temporary)  Pain at injection site (several day)  Decreased blood pressure (rare, temporary)  Weakness in leg (temporary)  Call if you experience:  Hives or difficulty breathing (go to the emergency room)  Inflammation or drainage at the injection site(s)  Please note:  Although the local anesthetic injected can often make your leg feel good for several hours after the injection,  The pain may return.  It takes 3-7 days for steroids to work.  You may not notice any pain relief for at least one week.  If effective, we will often do a series of injections spaced 3-6 weeks apart to maximally decrease your pain.  ++++++++++++++++++++++  Acute Kidney Injury, Adult Acute kidney injury is a sudden worsening of kidney function. The kidneys are organs that have several jobs. They filter the blood to remove waste products and extra fluid. They also maintain a healthy balance of minerals and hormones in the body, which helps control blood pressure and keep bones strong. With this condition, your kidneys do not do their jobs as well as they should. This condition ranges from mild to severe. Over time it may develop  into long-lasting (chronic) kidney disease. Early detection and treatment may prevent acute kidney injury from developing into a chronic condition. What are the causes? Common causes of this condition include:  A problem with blood flow to the kidneys. This may be caused by: ? Low blood pressure (hypotension) or shock. ? Blood loss. ? Heart and blood  vessel (cardiovascular) disease. ? Severe burns. ? Liver disease.  Direct damage to the kidneys. This may be caused by: ? Certain medicines. ? A kidney infection. ? Poisoning. ? Being around or in contact with toxic substances. ? A surgical wound. ? A hard, direct hit to the kidney area.  A sudden blockage of urine flow. This may be caused by: ? Cancer. ? Kidney stones. ? An enlarged prostate in males.  What are the signs or symptoms? Symptoms of this condition may not be obvious until the condition becomes severe. Symptoms of this condition can include:  Tiredness (lethargy), or difficulty staying awake.  Nausea or vomiting.  Swelling (edema) of the face, legs, ankles, or feet.  Problems with urination, such as: ? Abdominal pain, or pain along the side of your stomach (flank). ? Decreased urine production. ? Decrease in the force of urine flow.  Muscle twitches and cramps, especially in the legs.  Confusion or trouble concentrating.  Loss of appetite.  Fever.  How is this diagnosed? This condition may be diagnosed with tests, including:  Blood tests.  Urine tests.  Imaging tests.  A test in which a sample of tissue is removed from the kidneys to be examined under a microscope (kidney biopsy).  How is this treated? Treatment for this condition depends on the cause and how severe the condition is. In mild cases, treatment may not be needed. The kidneys may heal on their own. In more severe cases, treatment will involve:  Treating the cause of the kidney injury. This may involve changing any medicines you are taking or adjusting your dosage.  Fluids. You may need specialized IV fluids to balance your body's needs.  Having a catheter placed to drain urine and prevent blockages.  Preventing problems from occurring. This may mean avoiding certain medicines or procedures that can cause further injury to the kidneys.  In some cases treatment may also  require:  A procedure to remove toxic wastes from the body (dialysis or continuous renal replacement therapy - CRRT).  Surgery. This may be done to repair a torn kidney, or to remove the blockage from the urinary system.  Follow these instructions at home: Medicines  Take over-the-counter and prescription medicines only as told by your health care provider.  Do not take any new medicines without your health care provider's approval. Many medicines can worsen your kidney damage.  Do not take any vitamin and mineral supplements without your health care provider's approval. Many nutritional supplements can worsen your kidney damage. Lifestyle  If your health care provider prescribed changes to your diet, follow them. You may need to decrease the amount of protein you eat.  Achieve and maintain a healthy weight. If you need help with this, ask your health care provider.  Start or continue an exercise plan. Try to exercise at least 30 minutes a day, 5 days a week.  Do not use any tobacco products, such as cigarettes, chewing tobacco, and e-cigarettes. If you need help quitting, ask your health care provider. General instructions  Keep track of your blood pressure. Report changes in your blood pressure as told by your health care  provider.  Stay up to date with immunizations. Ask your health care provider which immunizations you need.  Keep all follow-up visits as told by your health care provider. This is important. Where to find more information:  American Association of Kidney Patients: ResidentialShow.is  [employer]: www.kidney.org  American Kid[employer] Fund: FightingMatch.com.ee  Life Options Rehabilitation Program: ? www.lifeoptions.org ? www.kidneyschool.org Contact a health care provider if:  Your symptoms get worse.  You develop new symptoms. Get help right away if:  You develop symptoms of worsening kidney disease, which include: ? Headaches. ? Abnormally dark or  light skin. ? Easy bruising. ? Frequent hiccups. ? Chest pain. ? Shortness of breath. ? End of menstruation in women. ? Seizures. ? Confusion or altered mental status. ? Abdominal or back pain. ? Itchiness.  You have a fever.  Your body is producing less urine.  You have pain or bleeding when you urinate. Summary  Acute kidney injury is a sudden worsening of kidney function.  Acute kidney injury can be caused by problems with blood flow to the kidneys, direct damage to the kidneys, and sudden blockage of urine flow.  Symptoms of this condition may not be obvious until it becomes severe. Symptoms may include edema, lethargy, confusion, nausea or vomiting, and problems passing urine.  This condition can usually be diagnosed with blood tests, urine tests, and imaging tests. Sometimes a kidney biopsy is done to diagnose this condition.  Treatment for this condition often involves treating the underlying cause. It is treated with fluids, medicines, dialysis, diet changes, or surgery. This information is not intended to replace advice given to you by your health care provider. Make sure you discuss any questions you have with your health care provider. Document Released: 06/10/2011 Document Revised: 03/27/2017 Document Reviewed: 11/15/2016 Elsevier Interactive Patient Education  [employer].

## 2018-01-22 NOTE — Progress Notes (Signed)
Subjective:    Patient ID: [name] [name], male    DOB: 11/30/1951, 67 y.o.   MRN: [id number]  HPI   This very nice 67 yo DWM with Rt LBP beginning after Lt THR in Nov 2015. He has Lumbar DDD/Spinal Stenosis evaluated in the past by Dr Ophelia CharterYates who did hid THR and then was seen by Dr Marikay Alaravid Jones and more recently by Dr Wyline MoodBranch in [place] and he has had 2 MRI and both Neurosurgeons advised they did not feel that he had a surgically remediable situation. He had EDSI's and then trigger point injections by Dr Murray HodgkinsBartko. He also has had trigger point injections in this office with good relief albeit only up to 6 weeks.      Patient had been on Celebrex for pain control til recent labs found acute renal injury with BUN 26,  Creat 1.19  & GFR 74, dropping 1 month ago to BUN 38, Creat 2.50 & GFR 30 and he was advised to d/c gCelebrex and increase fluids with 10 day f/ labs with sl improvement with BUN 30 , Creat 2.10 and GFR 37. Nephrology consultation has been requested. Patient admits that he has not stopped his Celebrex as recommended.    Medication Sig  . amphetamine-dextroamphetamine (ADDERALL) 20 MG tablet   . aspirin 81 MG tablet Take 81 mg by mouth daily.  Marland Kitchen. atenolol (TENORMIN) 100 MG tablet TAKE 1 BY MOUTH DAILY  . celecoxib (CELEBREX) 200 MG capsule TAKE ONE CAPSULE BY MOUTH DAILY  . Cholecalciferol (VITAMIN D PO) Take 5,000 Units by mouth daily.   Marland Kitchen. esomeprazole (NEXIUM) 40 MG capsule TAKE ONE CAPSULE BY MOUTH DAILY AT NOON  . finasteride (PROSCAR) 5 MG tablet Take 1 tablet (5 mg total) by mouth daily.  Marland Kitchen. LORazepam (ATIVAN) 1 MG tablet Take 1/2 to 1 tablet at hour of sleep  . losartan-hydrochlorothiazide (HYZAAR) 100-25 MG tablet Take 1/2 to 1 tablet daily as directed for BP  . atorvastatin (LIPITOR) 80 MG tablet Take 1 tablet (80 mg total) by mouth daily. (Patient not taking: Reported on 01/22/2018)  . baclofen (LIORESAL) 10 MG tablet Take 1 tablet (10 mg total) by mouth daily. (Patient not taking:  Reported on 12/17/2017)   No Known Allergies  Past Medical History:  Diagnosis Date  . Arthritis    right thumb  . Dry eyes    Takes Restasis  . GERD (gastroesophageal reflux disease)   . Hyperlipidemia   . Hypertension   . Vitamin D deficiency    Past Surgical History:  Procedure Laterality Date  . COLONOSCOPY W/ POLYPECTOMY    . ESOPHAGOGASTRODUODENOSCOPY  1990  . 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    Objective:   Physical Exam  BP 114/62   Pulse 64   Temp (!) 97.3 F (36.3 C)   Resp 16   Ht 5\' 7"  (1.702 m)   Wt 158 lb (71.7 kg)   BMI 24.75 kg/m   HEENT - WNL. Neck - supple.  Chest - Clear equal BS. Cor - Nl HS. RRR w/o sig MGR. PP 1(+). No edema. MS- FROM w/o deformities.  Gait Nl. Neuro -  Nl w/o focal abnormalities.  Patient localizes his pain over the Rt lateral buttock area & extending into the distribution  of the Rt anterolateral thigh. SLR is neg bilaterally and hip fig-4 and ROM is Nl.     Assessment & Plan:   1. Renal insufficiency  - Urinalysis, Routine w reflex microscopic - Sedimentation rate - C3 and C4 - C-reactive protein - ANCA screen with reflex titer - Serum protein electrophoresis with reflex - Protein Electrophoresis, Urine Rflx. - BASIC METABOLIC PANEL WITH GFR  2. Need for prophylactic vaccination against Streptococcus pneumoniae (pneumococcus)  - Pneumococcal conjugate vaccine 13-valent  3. Meralgia paraesthetica, right  - gabapentin (NEURONTIN) 100 MG capsule; Take 3 to 6 caps / day for pain  Dispense: 90 capsule; Refill: 2 - traMADol (ULTRAM) 50 MG tablet; Take 1 tablet every 4 hr if needed for pain  Dispense: 30 tablet; Refill: 0  4. Chronic right-sided low back pain with right-sided sciatica  - gabapentin  (NEURONTIN) 100 MG capsule; Take 3 to 6 caps / day for pain  Dispense: 90 capsule; Refill: 2  - traMADol (ULTRAM) 50 MG tablet; Take 1 tablet every 4 hr if needed for pain  (to take 1 tab qam)  Dispense: 30 tablet; Refill: 0  5. Medication management  - Patient advised to absolutely stop the Celebrex.   - ROV 8-10 day to assess meds  Over 25 minutes of exam, counseling, chart review and complex critical decision making was performed

## 2018-01-30 LAB — PROTEIN ELECTROPHORESIS, SERUM, WITH REFLEX
Albumin ELP: 4.5 g/dL (ref 3.8–4.8)
Alpha 1: 0.3 g/dL (ref 0.2–0.3)
Alpha 2: 0.7 g/dL (ref 0.5–0.9)
Beta 2: 0.3 g/dL (ref 0.2–0.5)
Beta Globulin: 0.4 g/dL (ref 0.4–0.6)
Gamma Globulin: 0.6 g/dL — ABNORMAL LOW (ref 0.8–1.7)
Total Protein: 6.7 g/dL (ref 6.1–8.1)

## 2018-01-30 LAB — URINALYSIS, ROUTINE W REFLEX MICROSCOPIC
Bilirubin Urine: NEGATIVE
Glucose, UA: NEGATIVE
Hgb urine dipstick: NEGATIVE
Ketones, ur: NEGATIVE
Leukocytes, UA: NEGATIVE
Nitrite: NEGATIVE
Protein, ur: NEGATIVE
Specific Gravity, Urine: 1.007 (ref 1.001–1.03)
pH: 6 (ref 5.0–8.0)

## 2018-01-30 LAB — BASIC METABOLIC PANEL WITH GFR
BUN/Creatinine Ratio: 10 (calc) (ref 6–22)
BUN: 16 mg/dL (ref 7–25)
CO2: 27 mmol/L (ref 20–32)
Calcium: 9.6 mg/dL (ref 8.6–10.3)
Chloride: 100 mmol/L (ref 98–110)
Creat: 1.62 mg/dL — ABNORMAL HIGH (ref 0.70–1.25)
GFR, Est African American: 51 mL/min/{1.73_m2} — ABNORMAL LOW (ref >=60)
GFR, Est Non African American: 44 mL/min/{1.73_m2} — ABNORMAL LOW (ref >=60)
Glucose, Bld: 87 mg/dL (ref 65–99)
Potassium: 4.1 mmol/L (ref 3.5–5.3)
Sodium: 135 mmol/L (ref 135–146)

## 2018-01-30 LAB — SEDIMENTATION RATE: Sed Rate: 9 mm/h (ref 0–20)

## 2018-01-30 LAB — C3 AND C4
C3 Complement: 128 mg/dL (ref 82–185)
C4 Complement: 35 mg/dL (ref 15–53)

## 2018-01-30 LAB — C-REACTIVE PROTEIN: CRP: 2.6 mg/L (ref <8.0)

## 2018-01-30 LAB — ANCA SCREEN W REFLEX TITER: ANCA Screen: NEGATIVE

## 2018-01-30 LAB — IFE INTERPRETATION: Immunofix Electr Int: NOT DETECTED

## 2018-02-02 ENCOUNTER — Other Ambulatory Visit: Payer: Self-pay | Admitting: *Deleted

## 2018-02-02 ENCOUNTER — Ambulatory Visit: Payer: BLUE CROSS/BLUE SHIELD | Admitting: Internal Medicine

## 2018-02-02 ENCOUNTER — Encounter: Payer: Self-pay | Admitting: Internal Medicine

## 2018-02-02 VITALS — BP 124/82 | HR 60 | Temp 97.3°F | Resp 16 | Ht 67.0 in | Wt 162.6 lb

## 2018-02-02 DIAGNOSIS — M5441 Lumbago with sciatica, right side: Secondary | ICD-10-CM | POA: Diagnosis not present

## 2018-02-02 DIAGNOSIS — N289 Disorder of kidney and ureter, unspecified: Secondary | ICD-10-CM

## 2018-02-02 DIAGNOSIS — G8929 Other chronic pain: Secondary | ICD-10-CM

## 2018-02-02 DIAGNOSIS — Z79899 Other long term (current) drug therapy: Secondary | ICD-10-CM | POA: Diagnosis not present

## 2018-02-02 DIAGNOSIS — M25551 Pain in right hip: Secondary | ICD-10-CM

## 2018-02-02 DIAGNOSIS — Z23 Encounter for immunization: Secondary | ICD-10-CM

## 2018-02-02 MED ORDER — ATENOLOL 100 MG PO TABS: ORAL_TABLET | ORAL | 1 refills | Status: DC

## 2018-02-02 MED ORDER — PREDNISONE 5 MG PO TABS: ORAL_TABLET | ORAL | 1 refills | Status: DC

## 2018-02-02 NOTE — Progress Notes (Signed)
Subjective:    Patient ID: [name] [name], male    DOB: 04/17/1951, 67 y.o.   MRN: [id number]  HPI    This very nice 67 yo DWM returns for 10 day f/u of Acute Renal insufficiency. Patient also has Chronic Rt LBP beginning about the time of his Lt THR in Nov 2015 due to Aseptic Necrosis of hip  (Lt).    Patient's kidney function decreased dramatically over the course of the last few months and Nephrology consultation has been requested.                                                                                                                                                                                   He also has ongoing Rt LBP and pains about the Rt flank. From last OV 10 days ago, patient was tried on Gabapentin and felt excessively sedated. He also is concurrently taking Tylenol 1,000 mg 2 x /day.  Today, he's also c/o pains about the Rt hip with ROM (Int/ext rotation).   Patient had 2 surgical consultations and each offered no recommendations for surgical remediation. He has had some relief with trigger point injections. He also describes pains over the anterior lateral Rt thigh suspect for Meralgia Paresthetica.                                       Medication Sig  . amphetamine-dextroamphetamine (ADDERALL) 20 MG tablet   . aspirin 81 MG tablet Take 81 mg by mouth daily.  Marland Kitchen atorvastatin (LIPITOR) 80 MG tablet Take 1 tablet (80 mg total) by mouth daily.  . Cholecalciferol (VITAMIN D PO) Take 5,000 Units by mouth daily.   Marland Kitchen esomeprazole (NEXIUM) 40 MG capsule TAKE ONE CAPSULE BY MOUTH DAILY AT NOON  . finasteride  (PROSCAR) 5 MG tablet Take 1 tablet (5 mg total) by mouth daily.  Marland Kitchen LORazepam (ATIVAN) 1 MG tablet Take 1/2 to 1 tablet at hour of sleep  . losartan-hydrochlorothiazide (HYZAAR) 100-25 MG tablet Take 1/2 to 1 tablet daily as directed for BP  . traMADol (ULTRAM) 50 MG tablet Take 1 tablet every 4 hr if needed for pain  . atenolol (TENORMIN) 100 MG tablet TAKE 1 BY MOUTH DAILY  . gabapentin (NEURONTIN) 100 MG capsule Take 3 to 6 caps / day for pain (Patient not taking: Reported on 02/02/2018)  . celecoxib (CELEBREX) 200 MG capsule TAKE ONE CAPSULE BY MOUTH DAILY   No Known Allergies   Past Medical History:  Diagnosis Date  . Arthritis    right thumb  . Dry eyes    Takes Restasis  . GERD (gastroesophageal reflux disease)   . Hyperlipidemia   . Hypertension   . Vitamin D deficiency    Past Surgical History:  Procedure Laterality Date  . COLONOSCOPY W/ POLYPECTOMY    . ESOPHAGOGASTRODUODENOSCOPY  1990  . TOTAL HIP ARTHROPLASTY Left 10/28/2014   Procedure: TOTAL HIP ARTHROPLASTY ANTERIOR APPROACH;  Surgeon: Eldred Manges, MD;  Location: [facility] OR;  Service: Orthopedics;  Laterality: Left;                                                                                                                                      Review of Systems  10 point systems review negative except as above.    Objective:   Physical Exam  BP 124/82   Pulse 60   Temp (!) 97.3 F (36.3 C)   Resp 16   Ht 5\' 7"  (1.702 m)   Wt 162 lb 9.6 oz (73.8 kg)   BMI 25.47 kg/m   HEENT - WNL. Neck - supple.  Chest - Clear equal BS. Cor - Nl HS. RRR w/o sig MGR. PP 1(+). No edema. MS- (+)tender over the low Rt para lumbar area  and also down the Rt buttock to the Rt hip and around anterior to the Rt antero-lateral thigh. Antalgic limping gait.  Neuro -  Nl w/o focal abnormalities.    Assessment & Plan:   1. Renal insufficiency  - BASIC METABOLIC PANEL WITH GFR  2. Chronic right-sided low back pain with  right-sided sciatica  - predniSONE (DELTASONE) 5 MG tablet; Take 1 tablet 1 to 3 x/ day as directed  Dispense: 90 tablet; Refill: 1  3. Right hip pain   - Patient is desperate for pain relief and willing to try low dose prednisone.   - predniSONE (DELTASONE) 5 MG tablet; Take 1 tablet 1 to 3 x/ day as directed  Dispense: 90 tablet; Refill: 1  - MR HIP RIGHT WO CONTRAST  4. Need for shingles vaccine  - Varicella zoster antibody, IgG  5. Medication management  - BASIC METABOLIC PANEL WITH GFR

## 2018-02-03 LAB — BASIC METABOLIC PANEL WITH GFR
BUN/Creatinine Ratio: 10 (calc) (ref 6–22)
BUN: 15 mg/dL (ref 7–25)
CO2: 26 mmol/L (ref 20–32)
Calcium: 9.6 mg/dL (ref 8.6–10.3)
Chloride: 98 mmol/L (ref 98–110)
Creat: 1.54 mg/dL — ABNORMAL HIGH (ref 0.70–1.25)
GFR, Est African American: 54 mL/min/{1.73_m2} — ABNORMAL LOW (ref >=60)
GFR, Est Non African American: 47 mL/min/{1.73_m2} — ABNORMAL LOW (ref >=60)
Glucose, Bld: 88 mg/dL (ref 65–99)
Potassium: 3.9 mmol/L (ref 3.5–5.3)
Sodium: 137 mmol/L (ref 135–146)

## 2018-02-03 LAB — VARICELLA ZOSTER ANTIBODY, IGG: Varicella IgG: 1062 index

## 2018-02-04 ENCOUNTER — Other Ambulatory Visit: Payer: Self-pay | Admitting: Internal Medicine

## 2018-02-04 DIAGNOSIS — M25551 Pain in right hip: Secondary | ICD-10-CM

## 2018-02-15 ENCOUNTER — Other Ambulatory Visit: Payer: Self-pay | Admitting: Internal Medicine

## 2018-02-15 ENCOUNTER — Other Ambulatory Visit: Payer: Self-pay | Admitting: Physician Assistant

## 2018-02-15 DIAGNOSIS — E782 Mixed hyperlipidemia: Secondary | ICD-10-CM

## 2018-02-15 MED ORDER — ATORVASTATIN CALCIUM 80 MG PO TABS: ORAL_TABLET | ORAL | 3 refills | Status: DC

## 2018-02-27 ENCOUNTER — Encounter: Payer: Self-pay | Admitting: Internal Medicine

## 2018-02-27 ENCOUNTER — Ambulatory Visit: Payer: BLUE CROSS/BLUE SHIELD | Admitting: Internal Medicine

## 2018-02-27 VITALS — BP 128/86 | HR 60 | Temp 97.7°F | Resp 16 | Ht 67.0 in | Wt 157.8 lb

## 2018-02-27 DIAGNOSIS — E782 Mixed hyperlipidemia: Secondary | ICD-10-CM | POA: Diagnosis not present

## 2018-02-27 DIAGNOSIS — G8929 Other chronic pain: Secondary | ICD-10-CM

## 2018-02-27 DIAGNOSIS — N289 Disorder of kidney and ureter, unspecified: Secondary | ICD-10-CM | POA: Diagnosis not present

## 2018-02-27 DIAGNOSIS — Z79899 Other long term (current) drug therapy: Secondary | ICD-10-CM | POA: Diagnosis not present

## 2018-02-27 DIAGNOSIS — I10 Essential (primary) hypertension: Secondary | ICD-10-CM

## 2018-02-27 DIAGNOSIS — M545 Low back pain: Secondary | ICD-10-CM

## 2018-02-27 LAB — BASIC METABOLIC PANEL WITH GFR
BUN: 21 mg/dL (ref 7–25)
CO2: 28 mmol/L (ref 20–32)
Calcium: 9.5 mg/dL (ref 8.6–10.3)
Chloride: 101 mmol/L (ref 98–110)
Creat: 1.21 mg/dL (ref 0.70–1.25)
GFR, Est African American: 72 mL/min/{1.73_m2} (ref >=60)
GFR, Est Non African American: 62 mL/min/{1.73_m2} (ref >=60)
Glucose, Bld: 83 mg/dL (ref 65–99)
Potassium: 4 mmol/L (ref 3.5–5.3)
Sodium: 137 mmol/L (ref 135–146)

## 2018-02-27 LAB — HEPATIC FUNCTION PANEL
AG Ratio: 2 (calc) (ref 1.0–2.5)
ALT: 21 U/L (ref 9–46)
AST: 19 U/L (ref 10–35)
Albumin: 4.4 g/dL (ref 3.6–5.1)
Alkaline phosphatase (APISO): 39 U/L — ABNORMAL LOW (ref 40–115)
Bilirubin, Direct: 0.1 mg/dL (ref 0.0–0.2)
Globulin: 2.2 g/dL (calc) (ref 1.9–3.7)
Indirect Bilirubin: 0.6 mg/dL (calc) (ref 0.2–1.2)
Total Bilirubin: 0.7 mg/dL (ref 0.2–1.2)
Total Protein: 6.6 g/dL (ref 6.1–8.1)

## 2018-02-27 NOTE — Progress Notes (Signed)
  Subjective:    Patient ID: [name] [name], male    DOB: 09/17/1951, 67 y.o.   MRN: [id number]  HPI  This nice 67 yo DWM with hx/o Chronic LBP, HTN, HLD, pDM, ADD, GERD, Vit D def returns for 1 month f/u on low dose prednisone tapered to 5 mg daily. He relates his pain level decreasing from intolerable to tolerable. He's also taking Tylenol 500 mg x 4 tabs/day. He has been able to resume some activities around his house & yard. He deferred getting the MRI as he's improved so much.     He's avoided NSAID's as instructed due to rise in Creat to 2.50  & drop in  GFR to 26 on Jan 9th and then improved on Feb 25 to Creat 1.54  & GFR 47.   Medication Sig  . ADDERALL 20 MG   . aspirin 81 MG  Take 81 mg by mouth daily.  Marland Kitchen. atenolol  100 MG TAKE 1 BY MOUTH DAILY  . atorvastatin  80 MG  Take 1 tablet daily for Cholesterol  . VITAMIN D Take 5,000 Units by mouth daily.   Marland Kitchen. esomeprazole  40 MG  TAKE ONE CAPSULE BY MOUTH DAILY AT NOON  . finasteride 5 MG Take 1 tablet (5 mg total) by mouth daily.  Marland Kitchen. LORazepam 1 MG Take 1/2 to 1 tablet at hour of sleep  . losartan-hctz 100-25 MG Take 1/2 to 1 tablet daily as directed for BP  . predniSONE  5 MG  Take 1 tablet 1 to 3 x/ day as directed  . traMADol  50 MG Take 1 tablet every 4 hr if needed for pain  . gabapentin  100 MG Take 3 to 6 caps / day for pain ( not taking: Reported 02/02/2018)   No Known Allergies   Past Medical History:  Diagnosis Date  . Arthritis    right thumb  . Dry eyes    Takes Restasis  . GERD (gastroesophageal reflux disease)   . Hyperlipidemia   . Hypertension   . Vitamin D deficiency    Past Surgical History:  Procedure Laterality Date  . COLONOSCOPY W/ POLYPECTOMY    . ESOPHAGOGASTRODUODENOSCOPY  1990  . 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    Objective:   Physical Exam  BP 128/86   Pulse 60   Temp 97.7 F (36.5 C)   Resp 16   Ht 5\' 7"  (1.702 m)   Wt 157 lb 12.8 oz (71.6 kg)   BMI 24.71 kg/m   HEENT - WNL. Neck - supple.  Chest - Clear equal BS. Cor - Nl HS. RRR w/o sig MGR. PP 1(+). No edema. MS- FROM w/o deformities.  Gait Nl. Neuro -  Nl w/o focal abnormalities.    Assessment & Plan:   1. Chronic right-sided low back pain without sciatica  - discussed changing prednisone 5 mg daily to 10-15 mg qod   - also discussed re-trying Gabapentin 100 mg 1 cap at bedtime  2. Essential hypertension  3. Mixed hyperlipidemia  - Hepatic function panel  4. Renal insufficiency  - BASIC METABOLIC PANEL WITH GFR  5. Medication management  - BASIC METABOLIC PANEL WITH GFR - Hepatic function panel

## 2018-03-24 ENCOUNTER — Encounter: Payer: Self-pay | Admitting: Internal Medicine

## 2018-03-24 ENCOUNTER — Ambulatory Visit: Payer: BLUE CROSS/BLUE SHIELD | Admitting: Internal Medicine

## 2018-03-24 VITALS — BP 116/72 | HR 64 | Temp 97.3°F | Resp 16 | Ht 67.0 in | Wt 151.6 lb

## 2018-03-24 DIAGNOSIS — K219 Gastro-esophageal reflux disease without esophagitis: Secondary | ICD-10-CM

## 2018-03-24 DIAGNOSIS — I10 Essential (primary) hypertension: Secondary | ICD-10-CM

## 2018-03-24 DIAGNOSIS — Z79899 Other long term (current) drug therapy: Secondary | ICD-10-CM

## 2018-03-24 DIAGNOSIS — E782 Mixed hyperlipidemia: Secondary | ICD-10-CM | POA: Diagnosis not present

## 2018-03-24 DIAGNOSIS — E559 Vitamin D deficiency, unspecified: Secondary | ICD-10-CM | POA: Diagnosis not present

## 2018-03-24 DIAGNOSIS — E8881 Metabolic syndrome: Secondary | ICD-10-CM

## 2018-03-24 DIAGNOSIS — E88819 Insulin resistance, unspecified: Secondary | ICD-10-CM

## 2018-03-24 NOTE — Progress Notes (Signed)
This very nice 67 y.o. DWM  presents for 6 month follow up with HTN, HLD, Pre-Diabetes and Vitamin D Deficiency. GERD is controlled on his meds. Patient has ongoing Lumbar pain consequent of DDD & DJD s/p back surgeries. He also endorsed SI joint pains which have responded well with trigger pint steroid injections.  He has had recent issues with renal insufficiency attributed to combo of Celebrex use and dehydration and lat renal parameters were essentially back to normal. He seems reasonally controlled with his back pain on low dose steroids      Patient is treated for HTN (1988)  & BP has been controlled at home. Today's BP is at goal - 116/72. Patient has had no complaints of any cardiac type chest pain, palpitations, dyspnea / orthopnea / PND, dizziness, claudication, or dependent edema.     Hyperlipidemia is controlled with diet & meds. Patient denies myalgias or other med SE's. Last Lipids were not at goal: Lab Results  Component Value Date   CHOL 203 (H) 12/17/2017   HDL 57 12/17/2017   LDLCALC 111 (H) 12/17/2017   TRIG 229 (H) 12/17/2017   CHOLHDL 3.6 12/17/2017      Also, the patient has  of  Mild insulin resistance (Insulin 28.05/2015 and 33.06/2017) and is followed expectantly for preDiabetes and has had no symptoms of reactive hypoglycemia, diabetic polys, paresthesias or visual blurring.  Last A1c was Normal & at goal: Lab Results  Component Value Date   HGBA1C 4.8 09/08/2017      Further, the patient also has history of Vitamin D Deficiency ("28"/2008) and supplements vitamin D without any suspected side-effects. Last vitamin D was still low: Lab Results  Component Value Date   VD25OH 45 09/08/2017   Current Outpatient Medications on File Prior to Visit  Medication Sig  . amphetamine-dextroamphetamine (ADDERALL) 20 MG tablet   . aspirin 81 MG tablet Take 81 mg by mouth daily.  Marland Kitchen. atenolol (TENORMIN) 100 MG tablet TAKE 1 BY MOUTH DAILY  . atorvastatin (LIPITOR) 80 MG  tablet Take 1 tablet daily for Cholesterol  . Cholecalciferol (VITAMIN D PO) Take 5,000 Units by mouth daily.   Marland Kitchen. esomeprazole (NEXIUM) 40 MG capsule TAKE ONE CAPSULE BY MOUTH DAILY AT NOON  . finasteride (PROSCAR) 5 MG tablet Take 1 tablet (5 mg total) by mouth daily.  Marland Kitchen. LORazepam (ATIVAN) 1 MG tablet Take 1/2 to 1 tablet at hour of sleep  . losartan-hydrochlorothiazide (HYZAAR) 100-25 MG tablet Take 1/2 to 1 tablet daily as directed for BP  . predniSONE (DELTASONE) 5 MG tablet Take 1 tablet 1 to 3 x/ day as directed  . traMADol (ULTRAM) 50 MG tablet Take 1 tablet every 4 hr if needed for pain   No current facility-administered medications on file prior to visit.    Allergies  Allergen Reactions  . Nsaids     Renal Insufficiency   PMHx:   Past Medical History:  Diagnosis Date  . Arthritis    right thumb  . Dry eyes    Takes Restasis  . GERD (gastroesophageal reflux disease)   . Hyperlipidemia   . Hypertension   . Vitamin D deficiency    Immunization History  Administered Date(s) Administered  . Influenza Split 09/22/2013  . Influenza-Unspecified 09/08/2017  . PPD Test 09/13/2014, 07/25/2015, 08/08/2016, 09/08/2017  . Pneumococcal Conjugate-13 01/22/2018  . Pneumococcal Polysaccharide-23 01/19/2009  . Tdap 01/19/2009  . Zoster 01/29/2013   Past Surgical History:  Procedure Laterality Date  .  COLONOSCOPY W/ POLYPECTOMY    . ESOPHAGOGASTRODUODENOSCOPY  1990  . TOTAL HIP ARTHROPLASTY Left 10/28/2014   Procedure: TOTAL HIP ARTHROPLASTY ANTERIOR APPROACH;  Surgeon: Eldred Manges, MD;  Location: [facility] OR;  Service: Orthopedics;  Laterality: Left;   FHx:    Reviewed / unchanged  SHx:    Reviewed / unchanged  Systems Review:  Constitutional: Denies fever, chills, wt changes, headaches, insomnia, fatigue, night sweats, change in appetite. Eyes: Denies redness, blurred vision, diplopia, discharge, itchy, watery eyes.  ENT: Denies discharge, congestion, post nasal drip,  epistaxis, sore throat, earache, hearing loss, dental pain, tinnitus, vertigo, sinus pain, snoring.  CV: Denies chest pain, palpitations, irregular heartbeat, syncope, dyspnea, diaphoresis, orthopnea, PND, claudication or edema. Respiratory: denies cough, dyspnea, DOE, pleurisy, hoarseness, laryngitis, wheezing.  Gastrointestinal: Denies dysphagia, odynophagia, heartburn, reflux, water brash, abdominal pain or cramps, nausea, vomiting, bloating, diarrhea, constipation, hematemesis, melena, hematochezia  or hemorrhoids. Genitourinary: Denies dysuria, frequency, urgency, nocturia, hesitancy, discharge, hematuria or flank pain. Musculoskeletal: Denies arthralgias, myalgias, stiffness, jt. swelling, pain, limping or strain/sprain.  Skin: Denies pruritus, rash, hives, warts, acne, eczema or change in skin lesion(s). Neuro: No weakness, tremor, incoordination, spasms, paresthesia or pain. Psychiatric: Denies confusion, memory loss or sensory loss. Endo: Denies change in weight, skin or hair change.  Heme/Lymph: No excessive bleeding, bruising or enlarged lymph nodes.  Physical Exam  BP 116/72   Pulse 64   Temp (!) 97.3 F (36.3 C)   Resp 16   Ht 5\' 7"  (1.702 m)   Wt 151 lb 9.6 oz (68.8 kg)   BMI 23.74 kg/m   Appears  well nourished, well groomed  and in no distress.  Eyes: PERRLA, EOMs, conjunctiva no swelling or erythema. Sinuses: No frontal/maxillary tenderness ENT/Mouth: EAC's clear, TM's nl w/o erythema, bulging. Nares clear w/o erythema, swelling, exudates. Oropharynx clear without erythema or exudates. Oral hygiene is good. Tongue normal, non obstructing. Hearing intact.  Neck: Supple. Thyroid not palpable. Car 2+/2+ without bruits, nodes or JVD. Chest: Respirations nl with BS clear & equal w/o rales, rhonchi, wheezing or stridor.  Cor: Heart sounds normal w/ regular rate and rhythm without sig. murmurs, gallops, clicks or rubs. Peripheral pulses normal and equal  without edema.    Abdomen: Soft & bowel sounds normal. Non-tender w/o guarding, rebound, hernias, masses or organomegaly.  Lymphatics: Unremarkable.  Musculoskeletal: Full ROM all peripheral extremities, joint stability, 5/5 strength and normal gait.  Skin: Warm, dry without exposed rashes, lesions or ecchymosis apparent.  Neuro: Cranial nerves intact, reflexes equal bilaterally. Sensory-motor testing grossly intact. Tendon reflexes grossly intact.  Pysch: Alert & oriented x 3.  Insight and judgement nl & appropriate. No ideations.  Assessment and Plan:  1. Essential hypertension  - Continue medication, monitor blood pressure at home.  - Continue DASH diet.  Reminder to go to the ER if any CP,  SOB, nausea, dizziness, severe HA, changes vision/speech.  - CBC with Differential/Platelet - BASIC METABOLIC PANEL WITH GFR - TSH  2. Hyperlipidemia, mixed  - Continue diet/meds, exercise,& lifestyle modifications.  - Continue monitor periodic cholesterol/liver & renal functions   - Magnesium - Lipid panel - TSH  3. Insulin resistance  - Hemoglobin A1c - Insulin, random  4. Vitamin D deficiency  - Continue diet, exercise, lifestyle modifications.  - Monitor appropriate labs. - Continue supplementation.   - VITAMIN D 25 Hydroxyl  5. Gastroesophageal reflux disease  - CBC with Differential/Platelet  6. Medication management  - CBC with Differential/Platelet - BASIC METABOLIC  PANEL WITH GFR - Magnesium - Lipid panel - TSH - Hemoglobin A1c - Insulin, random - VITAMIN D 25 Hydroxyl          Discussed  regular exercise, BP monitoring, weight control to achieve/maintain BMI less than 25 and discussed med and SE's. Recommended labs to assess and monitor clinical status with further disposition pending results of labs. Over 30 minutes of exam, counseling, chart review was performed.

## 2018-03-24 NOTE — Patient Instructions (Signed)

## 2018-03-25 LAB — CBC WITH DIFFERENTIAL/PLATELET
Basophils Absolute: 56 cells/uL (ref 0–200)
Basophils Relative: 0.6 %
Eosinophils Absolute: 0 cells/uL — ABNORMAL LOW (ref 15–500)
Eosinophils Relative: 0 %
HCT: 44.5 % (ref 38.5–50.0)
Hemoglobin: 15.9 g/dL (ref 13.2–17.1)
Lymphs Abs: 987 cells/uL (ref 850–3900)
MCH: 34.4 pg — ABNORMAL HIGH (ref 27.0–33.0)
MCHC: 35.7 g/dL (ref 32.0–36.0)
MCV: 96.3 fL (ref 80.0–100.0)
MPV: 9.5 fL (ref 7.5–12.5)
Monocytes Relative: 10.2 %
Neutro Abs: 7398 cells/uL (ref 1500–7800)
Neutrophils Relative %: 78.7 %
Platelets: 248 10*3/uL (ref 140–400)
RBC: 4.62 10*6/uL (ref 4.20–5.80)
RDW: 12.1 % (ref 11.0–15.0)
Total Lymphocyte: 10.5 %
WBC mixed population: 959 cells/uL — ABNORMAL HIGH (ref 200–950)
WBC: 9.4 10*3/uL (ref 3.8–10.8)

## 2018-03-25 LAB — BASIC METABOLIC PANEL WITH GFR
BUN/Creatinine Ratio: 15 (calc) (ref 6–22)
BUN: 24 mg/dL (ref 7–25)
CO2: 31 mmol/L (ref 20–32)
Calcium: 9.5 mg/dL (ref 8.6–10.3)
Chloride: 99 mmol/L (ref 98–110)
Creat: 1.64 mg/dL — ABNORMAL HIGH (ref 0.70–1.25)
GFR, Est African American: 50 mL/min/{1.73_m2} — ABNORMAL LOW (ref >=60)
GFR, Est Non African American: 43 mL/min/{1.73_m2} — ABNORMAL LOW (ref >=60)
Glucose, Bld: 102 mg/dL — ABNORMAL HIGH (ref 65–99)
Potassium: 3.8 mmol/L (ref 3.5–5.3)
Sodium: 138 mmol/L (ref 135–146)

## 2018-03-25 LAB — INSULIN, RANDOM: Insulin: 27.6 u[IU]/mL — ABNORMAL HIGH (ref 2.0–19.6)

## 2018-03-25 LAB — TSH: TSH: 0.68 mIU/L (ref 0.40–4.50)

## 2018-03-25 LAB — LIPID PANEL
Cholesterol: 202 mg/dL — ABNORMAL HIGH (ref <200)
HDL: 67 mg/dL (ref >40)
LDL Cholesterol (Calc): 98 mg/dL (calc)
Non-HDL Cholesterol (Calc): 135 mg/dL (calc) — ABNORMAL HIGH (ref <130)
Total CHOL/HDL Ratio: 3 (calc) (ref <5.0)
Triglycerides: 256 mg/dL — ABNORMAL HIGH (ref <150)

## 2018-03-25 LAB — HEMOGLOBIN A1C
Hgb A1c MFr Bld: 5 % of total Hgb (ref <5.7)
Mean Plasma Glucose: 97 (calc)
eAG (mmol/L): 5.4 (calc)

## 2018-03-25 LAB — VITAMIN D 25 HYDROXY (VIT D DEFICIENCY, FRACTURES): Vit D, 25-Hydroxy: 38 ng/mL (ref 30–100)

## 2018-03-25 LAB — MAGNESIUM: Magnesium: 2 mg/dL (ref 1.5–2.5)

## 2018-03-26 ENCOUNTER — Telehealth: Payer: Self-pay | Admitting: Internal Medicine

## 2018-03-26 NOTE — Telephone Encounter (Signed)
Per Dr Oneta RackMcKeown, faxed Feb-April labs and notes to [facility], requesting status and referral appt. Lvm w. Provider.

## 2018-03-30 NOTE — Progress Notes (Signed)
Subjective:    Patient ID: [name] [name], male    DOB: 04/24/1951, 67 y.o.   MRN: [id number]  HPI 67 y.o. WM presents with back pain.  Larey SeatFell off a ladder in Feb/ march 2016, has seen Dr. Ophelia CharterYates and Dr. Yetta BarreJones for his back pain in the past, normal MRI. Had MRI back 07/2016. No help from epidural injections but had trigger point injection with pain management that helped. Tried dry needling and PT but it did not help. Last injections were 3 months ago. Tried lyrica/gabapentin that did not help.   Blood pressure 118/74, pulse 76, temperature 97.8 F (36.6 C), height 5\' 7"  (1.702 m), weight 152 lb (68.9 kg), SpO2 96 %.  Medications Current Outpatient Medications on File Prior to Visit  Medication Sig  . amphetamine-dextroamphetamine (ADDERALL) 20 MG tablet   . aspirin 81 MG tablet Take 81 mg by mouth daily.  Marland Kitchen. atenolol (TENORMIN) 100 MG tablet TAKE 1 BY MOUTH DAILY  . atorvastatin (LIPITOR) 80 MG tablet Take 1 tablet daily for Cholesterol  . Cholecalciferol (VITAMIN D PO) Take 5,000 Units by mouth daily.   Marland Kitchen. esomeprazole (NEXIUM) 40 MG capsule TAKE ONE CAPSULE BY MOUTH DAILY AT NOON  . finasteride (PROSCAR) 5 MG tablet Take 1 tablet (5 mg total) by mouth daily.  Marland Kitchen. LORazepam (ATIVAN) 1 MG tablet Take 1/2 to 1 tablet at hour of sleep  . losartan-hydrochlorothiazide (HYZAAR) 100-25 MG tablet Take 1/2 to 1 tablet daily as directed for BP  . predniSONE (DELTASONE) 5 MG tablet Take 1 tablet 1 to 3 x/ day as directed  . traMADol (ULTRAM) 50 MG tablet Take 1 tablet every 4 hr if needed for pain   No current facility-administered medications on file prior to visit.     Problem list He has Hypertension; Hyperlipidemia; GERD (gastroesophageal reflux disease); Vitamin D deficiency; Blood glucose abnormal; Medication management; Insulin resistance; Left hip pain; Avascular necrosis of left femoral head (HCC); Insomnia; Gout; DDD (degenerative disc disease), lumbosacral; and CKD (chronic kidney  disease) stage 3, GFR 30-59 ml/min (HCC) on their problem list.   Review of Systems  Constitutional: Negative for chills and fever.  Gastrointestinal: Negative for abdominal pain, constipation, diarrhea, nausea and vomiting.  Genitourinary: Negative for difficulty urinating, dysuria, frequency, hematuria and urgency.  Musculoskeletal: Positive for back pain and gait problem.       Objective:   Physical Exam  Constitutional: He is oriented to person, place, and time. He appears well-developed and well-nourished. No distress.  HENT:  Head: Normocephalic.  Mouth/Throat: Oropharynx is clear and moist. No oropharyngeal exudate.  Eyes: Conjunctivae are normal. No scleral icterus.  Neck: Normal range of motion. Neck supple.  Cardiovascular: Normal rate, regular rhythm, normal heart sounds and intact distal pulses. Exam reveals no gallop and no friction rub.  No murmur heard. Pulmonary/Chest: Effort normal and breath sounds normal. No respiratory distress. He has no wheezes. He has no rales. He exhibits no tenderness.  Abdominal: Soft. Bowel sounds are normal. He exhibits no distension and no mass. There is no tenderness. There is no rebound and no guarding.  Musculoskeletal:       Right hip: He exhibits decreased range of motion. He exhibits normal strength, no tenderness, no bony tenderness, no swelling, no crepitus, no deformity and no laceration.       Lumbar back: He exhibits decreased range of motion, tenderness and pain. He exhibits no bony tenderness, no swelling, no edema, no deformity, no laceration, no spasm and  normal pulse.  Walks with antalgic gait, + pinpoint tenderness bilateral lower back, negative straight leg, normal distal neurovascular exam.   Neurological: He is alert and oriented to person, place, and time.  Skin: Skin is warm and dry. He is not diaphoretic.  Psychiatric: He has a normal mood and affect. His behavior is normal. Judgment and thought content normal.   Nursing note and vitals reviewed.     Assessment & Plan:   Chronic left-sided low back pain with left-sided sciatica Trigger point injection bilateral, handled well, no bleeding Baclofen PRN - dexamethasone (DECADRON) injection 20 mg; Inject 2 mLs (20 mg total) into the muscle once. ? BENEFIT FROM SI FILMS- orders placed  Future Appointments  Date Time Provider Department Center  07/09/2018  9:30 AM Judd Gaudier, NP [facility] None  10/09/2018 10:00 AM Lucky Cowboy, MD [facility] None

## 2018-04-01 ENCOUNTER — Encounter: Payer: Self-pay | Admitting: Physician Assistant

## 2018-04-01 ENCOUNTER — Ambulatory Visit: Payer: BLUE CROSS/BLUE SHIELD | Admitting: Physician Assistant

## 2018-04-01 VITALS — BP 118/74 | HR 76 | Temp 97.8°F | Ht 67.0 in | Wt 152.0 lb

## 2018-04-01 DIAGNOSIS — G8929 Other chronic pain: Secondary | ICD-10-CM

## 2018-04-01 DIAGNOSIS — M545 Low back pain, unspecified: Secondary | ICD-10-CM

## 2018-04-01 DIAGNOSIS — M533 Sacrococcygeal disorders, not elsewhere classified: Secondary | ICD-10-CM | POA: Diagnosis not present

## 2018-04-01 MED ORDER — DEXAMETHASONE SODIUM PHOSPHATE 100 MG/10ML IJ SOLN
20.0000 mg | Freq: Once | INTRAMUSCULAR | Status: AC
  Administered 2018-04-01: 20 mg via INTRAMUSCULAR

## 2018-04-01 NOTE — Patient Instructions (Signed)
[facility], go to radiology and give them your name. They will have the order and take you back. You do not any paper work, I should get the result back today or tomorrow. This order is good for a year.

## 2018-04-15 ENCOUNTER — Encounter: Payer: Self-pay | Admitting: Internal Medicine

## 2018-04-15 ENCOUNTER — Other Ambulatory Visit: Payer: Self-pay | Admitting: Internal Medicine

## 2018-04-15 DIAGNOSIS — I10 Essential (primary) hypertension: Secondary | ICD-10-CM

## 2018-04-15 MED ORDER — LOSARTAN POTASSIUM-HCTZ 100-25 MG PO TABS: ORAL_TABLET | ORAL | 3 refills | Status: DC

## 2018-04-22 ENCOUNTER — Other Ambulatory Visit: Payer: Self-pay

## 2018-04-22 ENCOUNTER — Encounter: Payer: Self-pay | Admitting: Internal Medicine

## 2018-04-22 MED ORDER — ATENOLOL 100 MG PO TABS: ORAL_TABLET | ORAL | 1 refills | Status: DC

## 2018-04-24 ENCOUNTER — Other Ambulatory Visit: Payer: Self-pay | Admitting: Nephrology

## 2018-04-24 DIAGNOSIS — I10 Essential (primary) hypertension: Secondary | ICD-10-CM

## 2018-04-24 DIAGNOSIS — N183 Chronic kidney disease, stage 3 unspecified: Secondary | ICD-10-CM

## 2018-04-24 DIAGNOSIS — E559 Vitamin D deficiency, unspecified: Secondary | ICD-10-CM

## 2018-05-08 ENCOUNTER — Ambulatory Visit
Admission: RE | Admit: 2018-05-08 | Discharge: 2018-05-08 | Disposition: A | Discharge status: Home / Self Care | Payer: BLUE CROSS/BLUE SHIELD | Source: Ambulatory Visit | Attending: Physician Assistant | Admitting: Physician Assistant

## 2018-05-08 ENCOUNTER — Ambulatory Visit
Admission: RE | Admit: 2018-05-08 | Discharge: 2018-05-08 | Disposition: A | Discharge status: Home / Self Care | Payer: BLUE CROSS/BLUE SHIELD | Source: Ambulatory Visit | Attending: Nephrology | Admitting: Nephrology

## 2018-05-08 DIAGNOSIS — N183 Chronic kidney disease, stage 3 unspecified: Secondary | ICD-10-CM

## 2018-05-08 DIAGNOSIS — G8929 Other chronic pain: Secondary | ICD-10-CM

## 2018-05-08 DIAGNOSIS — E559 Vitamin D deficiency, unspecified: Secondary | ICD-10-CM

## 2018-05-08 DIAGNOSIS — I10 Essential (primary) hypertension: Secondary | ICD-10-CM

## 2018-05-08 DIAGNOSIS — M545 Low back pain, unspecified: Secondary | ICD-10-CM

## 2018-05-08 DIAGNOSIS — M533 Sacrococcygeal disorders, not elsewhere classified: Secondary | ICD-10-CM

## 2018-06-22 ENCOUNTER — Other Ambulatory Visit: Payer: Self-pay | Admitting: Internal Medicine

## 2018-07-05 ENCOUNTER — Other Ambulatory Visit: Payer: Self-pay | Admitting: Internal Medicine

## 2018-07-09 ENCOUNTER — Ambulatory Visit: Payer: Self-pay | Admitting: Adult Health

## 2018-07-14 LAB — HM COLONOSCOPY

## 2018-07-15 ENCOUNTER — Ambulatory Visit: Payer: Self-pay | Admitting: Adult Health

## 2018-07-25 NOTE — Progress Notes (Signed)
FOLLOW UP  Assessment and Plan:   Hypertension Well controlled with current medications  Monitor blood pressure at home; patient to call if consistently greater than 130/80 Continue DASH diet.   Reminder to go to the ER if any CP, SOB, nausea, dizziness, severe HA, changes vision/speech, left arm numbness and tingling and jaw pain.  [facility] continue stat[facility] - triglycerides discussed Continue low cholesterol diet and exercise.  Check lipid panel.   Other abnormal glucose Elevated glucose and insulin levels without A1C abnormalities Continue diet and exercise.  Perform daily foot/skin check, notify office of any concerning changes.  Defer A1C; check bmp  Vitamin D Def Below goal at last visit; continue supplementation to maintain goal of 70-100 Defer Vit D level  Chronic lower back pain Managed by q3 month trigger point injections Verbal consent obtained, risk and benefits were addressed with patient. A trigger point injection was performed at the site of maximal tenderness using 1% plain Lidocaine and Dexamethasone. This was well tolerated, and followed by immediate relief of pain. Return precautions discussed with the patient.  [place], will get next injections prior to his trip.   Continue diet and meds as discussed. Further disposition pending results of labs. Discussed med's effects and SE's.   Over 30 minutes of exam, counseling, chart review, and critical decision making was performed.   Future Appointments  Date Time Provider Department Center  10/09/2018 10:00 AM Lucky CowboyMcKeown, William, MD [facility] None    ----------------------------------------------------------------------------------------------------------------------  HPI 67 y.o. male  presents for 3 month follow up on hypertension, cholesterol, glucose management, weight monitoring and vitamin D deficiency.   He is also requesting trigger point injection for chronic back pain  ongoing after falling off of a ladder in 2016; normal MRI, has been evaluated by Dr. Ophelia CharterYates and Yetta BarreJones. Epidural injections, dry needling, PT, lyrica/gabapentin have been ineffective in the past. He did have success with trigger point injections initiated by pain management. Last injection was 3 months ago.    BMI is Body mass index is 24.43 kg/m., he has been working on diet and exercise. Wt Readings from Last 3 Encounters:  07/27/18 156 lb (70.8 kg)  04/01/18 152 lb (68.9 kg)  03/24/18 151 lb 9.6 oz (68.8 kg)   His blood pressure has been controlled at home, he is on 1 pill of his BP med, today their BP is BP: 92/60  He does workout. He denies chest pain, shortness of breath, dizziness.   He is on cholesterol medication and denies myalgias. His LDL cholesterol is at goal; triglycerides remain elevated. The cholesterol last visit was:   Lab Results  Component Value Date   CHOL 202 (H) 03/24/2018   HDL 67 03/24/2018   LDLCALC 98 03/24/2018   TRIG 256 (H) 03/24/2018   CHOLHDL 3.0 03/24/2018    He has been working on diet and exercise for glucose management, and denies foot ulcerations, nausea, polydipsia, polyuria, visual disturbances, vomiting and weight loss. Last A1C in the office was:  Lab Results  Component Value Date   HGBA1C 5.0 03/24/2018   Patient is on Vitamin D supplement but remained below goal at the last visit:   Lab Results  Component Value Date   VD25OH 38 03/24/2018     BMI is Body mass index is 24.43 kg/m., he is working on diet and exercise. Wt Readings from Last 3 Encounters:  07/27/18 156 lb (70.8 kg)  04/01/18 152 lb (68.9 kg)  03/24/18 151 lb 9.6  oz (68.8 kg)    Current Medications:  Current Outpatient Medications on File Prior to Visit  Medication Sig  . acetaminophen (TYLENOL) 500 MG tablet Take 500 mg by mouth every 6 (six) hours as needed.  Marland Kitchen amphetamine-dextroamphetamine (ADDERALL) 20 MG tablet   . atenolol (TENORMIN) 100 MG tablet TAKE 1 TABLET BY  MOUTH DAILY  . atorvastatin (LIPITOR) 80 MG tablet Take 1 tablet daily for Cholesterol  . Cholecalciferol (VITAMIN D PO) Take 5,000 Units by mouth daily.   Marland Kitchen esomeprazole (NEXIUM) 40 MG capsule TAKE ONE CAPSULE BY MOUTH DAILY AT NOON  . finasteride (PROSCAR) 5 MG tablet Take 1 tablet (5 mg total) by mouth daily.  Marland Kitchen losartan-hydrochlorothiazide (HYZAAR) 100-25 MG tablet Take 1/2 to 1 tablet daily as directed for BP  . predniSONE (DELTASONE) 5 MG tablet Take 1 tablet 1 to 3 x/ day as directed  . valACYclovir (VALTREX) 500 MG tablet TAKE 1 TABLET BY MOUTH THREE TIMES DAILY FOR FEVER BLISTERS   No current facility-administered medications on file prior to visit.      Allergies:  Allergies  Allergen Reactions  . Nsaids     Renal Insufficiency     Medical History:  Past Medical History:  Diagnosis Date  . Arthritis    right thumb  . Dry eyes    Takes Restasis  . GERD (gastroesophageal reflux disease)   . Hyperlipidemia   . Hypertension   . Vitamin D deficiency    Family history- Reviewed and unchanged Social history- Reviewed and unchanged   Review of Systems:  Review of Systems  Constitutional: Negative for malaise/fatigue and weight loss.  HENT: Negative for hearing loss and tinnitus.   Eyes: Negative for blurred vision and double vision.  Respiratory: Negative for cough, shortness of breath and wheezing.   Cardiovascular: Negative for chest pain, palpitations, orthopnea, claudication and leg swelling.  Gastrointestinal: Negative for abdominal pain, blood in stool, constipation, diarrhea, heartburn, melena, nausea and vomiting.  Genitourinary: Negative.   Musculoskeletal: Positive for back pain. Negative for joint pain and myalgias.  Skin: Negative for rash.  Neurological: Positive for focal weakness. Negative for dizziness, tingling, sensory change, weakness and headaches.  Endo/Heme/Allergies: Negative for polydipsia.  Psychiatric/Behavioral: Negative.   All other  systems reviewed and are negative.   Physical Exam: BP 92/60   Pulse 62   Temp 98.6 F (37 C)   Resp 16   Ht 5\' 7"  (1.702 m)   Wt 156 lb (70.8 kg)   SpO2 98%   BMI 24.43 kg/m  Wt Readings from Last 3 Encounters:  07/27/18 156 lb (70.8 kg)  04/01/18 152 lb (68.9 kg)  03/24/18 151 lb 9.6 oz (68.8 kg)   General Appearance: Well nourished, in no apparent distress. Eyes: PERRLA, EOMs, conjunctiva no swelling or erythema Sinuses: No Frontal/maxillary tenderness ENT/Mouth: Ext aud canals clear, TMs without erythema, bulging. No erythema, swelling, or exudate on post pharynx.  Tonsils not swollen or erythematous. Hearing normal.  Neck: Supple, thyroid normal.  Respiratory: Respiratory effort normal, BS equal bilaterally without rales, rhonchi, wheezing or stridor.  Cardio: RRR with no MRGs. Brisk peripheral pulses without edema.  Abdomen: Soft, + BS.  Non tender, no guarding, rebound, hernias, masses. Lymphatics: Non tender without lymphadenopathy.  Musculoskeletal: Full ROM (pain with ROM in lumbar), 5/5 strength, Normal gait Skin: Warm, dry without rashes, lesions, ecchymosis.  Neuro: Cranial nerves intact. No cerebellar symptoms.  Psych: Awake and oriented X 3, normal affect, Insight and Judgment appropriate.  Quentin MullingAmanda Aryanah Enslow, PA-C 3:51 PM [facility]

## 2018-07-27 ENCOUNTER — Encounter: Payer: Self-pay | Admitting: Physician Assistant

## 2018-07-27 ENCOUNTER — Other Ambulatory Visit: Payer: Self-pay | Admitting: Physician Assistant

## 2018-07-27 ENCOUNTER — Ambulatory Visit: Payer: BLUE CROSS/BLUE SHIELD | Admitting: Physician Assistant

## 2018-07-27 VITALS — BP 92/60 | HR 62 | Temp 98.6°F | Resp 16 | Ht 67.0 in | Wt 156.0 lb

## 2018-07-27 DIAGNOSIS — M5137 Other intervertebral disc degeneration, lumbosacral region: Secondary | ICD-10-CM

## 2018-07-27 DIAGNOSIS — N183 Chronic kidney disease, stage 3 unspecified: Secondary | ICD-10-CM

## 2018-07-27 DIAGNOSIS — E785 Hyperlipidemia, unspecified: Secondary | ICD-10-CM | POA: Diagnosis not present

## 2018-07-27 DIAGNOSIS — I10 Essential (primary) hypertension: Secondary | ICD-10-CM | POA: Diagnosis not present

## 2018-07-27 DIAGNOSIS — E559 Vitamin D deficiency, unspecified: Secondary | ICD-10-CM | POA: Diagnosis not present

## 2018-07-27 DIAGNOSIS — E8881 Metabolic syndrome: Secondary | ICD-10-CM

## 2018-07-27 DIAGNOSIS — E88819 Insulin resistance, unspecified: Secondary | ICD-10-CM

## 2018-07-27 MED ORDER — FINASTERIDE 5 MG PO TABS: 5.0000 mg | ORAL_TABLET | Freq: Every day | ORAL | 1 refills | Status: AC

## 2018-07-27 NOTE — Patient Instructions (Signed)
Monitor your blood pressure at home, please keep a record and bring that in with you to your next office visit.   Cut losartan/25mg  in half and monitor BP  Go to the ER if any CP, SOB, nausea, dizziness, severe HA, changes vision/speech  Due to a recent study, SPRINT, we have changed our goal for the systolic or top blood pressure number. Ideally we want your top number at 120.  In the [facility] Trial, 5000 people were randomized to a goal BP of 120 and 5000 people were randomized to a goal BP of less than 140. The patients with the goal BP at 120 had LESS DEMENTIA, LESS HEART ATTACKS, AND LESS STROKES, AS WELL AS OVERALL DECREASED MORTALITY OR DEATH RATE.   If you are willing, our goal BP is the top number of 120.  Your most recent BP: BP: 92/60   Take your medications faithfully as instructed. Maintain a healthy weight. Get at least 150 minutes of aerobic exercise per week. Minimize salt intake. Minimize alcohol intake  DASH Eating Plan DASH stands for "Dietary Approaches to Stop Hypertension." The DASH eating plan is a healthy eating plan that has been shown to reduce high blood pressure (hypertension). Additional health benefits may include reducing the risk of type 2 diabetes mellitus, heart disease, and stroke. The DASH eating plan may also help with weight loss. WHAT DO I NEED TO KNOW ABOUT THE DASH EATING PLAN? For the DASH eating plan, you will follow these general guidelines:  Choose foods with a percent daily value for sodium of less than 5% (as listed on the food label).  Use salt-free seasonings or herbs instead of table salt or sea salt.  Check with your health care provider or pharmacist before using salt substitutes.  Eat lower-sodium products, often labeled as "lower sodium" or "no salt added."  Eat fresh foods.  Eat more vegetables, fruits, and low-fat dairy products.  Choose whole grains. Look for the word "whole" as the first word in the ingredient list.  Choose  fish and skinless chicken or [place] more often than red meat. Limit fish, poultry, and meat to 6 oz (170 g) each day.  Limit sweets, desserts, sugars, and sugary drinks.  Choose heart-healthy fats.  Limit cheese to 1 oz (28 g) per day.  Eat more home-cooked food and less restaurant, buffet, and fast food.  Limit fried foods.  Cook foods using methods other than frying.  Limit canned vegetables. If you do use them, rinse them well to decrease the sodium.  When eating at a restaurant, ask that your food be prepared with less salt, or no salt if possible. WHAT FOODS CAN I EAT? Seek help from a dietitian for individual calorie needs. Grains Whole grain or whole wheat bread. Brown rice. Whole grain or whole wheat pasta. Quinoa, bulgur, and whole grain cereals. Low-sodium cereals. Corn or whole wheat flour tortillas. Whole grain cornbread. Whole grain crackers. Low-sodium crackers. Vegetables Fresh or frozen vegetables (raw, steamed, roasted, or grilled). Low-sodium or reduced-sodium tomato and vegetable juices. Low-sodium or reduced-sodium tomato sauce and paste. Low-sodium or reduced-sodium canned vegetables.  Fruits All fresh, canned (in natural juice), or frozen fruits. Meat and Other Protein Products Ground beef (85% or leaner), grass-fed beef, or beef trimmed of fat. Skinless chicken or [place]. Ground chicken or [place]key. Pork trimmed of fat. All fish and seafood. Eggs. Dried beans, peas, or lentils. Unsalted nuts and seeds. Unsalted canned beans. Dairy Low-fat dairy products, such as skim or 1%  milk, 2% or reduced-fat cheeses, low-fat ricotta or cottage cheese, or plain low-fat yogurt. Low-sodium or reduced-sodium cheeses. Fats and Oils Tub margarines without trans fats. Light or reduced-fat mayonnaise and salad dressings (reduced sodium). Avocado. Safflower, olive, or canola oils. Natural peanut or almond butter. Other Unsalted popcorn and pretzels. The items listed above may not be  a complete list of recommended foods or beverages. Contact your dietitian for more options. WHAT FOODS ARE NOT RECOMMENDED? Grains White bread. White pasta. White rice. Refined cornbread. Bagels and croissants. Crackers that contain trans fat. Vegetables Creamed or fried vegetables. Vegetables in a cheese sauce. Regular canned vegetables. Regular canned tomato sauce and paste. Regular tomato and vegetable juices. Fruits Dried fruits. Canned fruit in light or heavy syrup. Fruit juice. Meat and Other Protein Products Fatty cuts of meat. Ribs, chicken wings, bacon, sausage, bologna, salami, chitterlings, fatback, hot dogs, bratwurst, and packaged luncheon meats. Salted nuts and seeds. Canned beans with salt. Dairy Whole or 2% milk, cream, half-and-half, and cream cheese. Whole-fat or sweetened yogurt. Full-fat cheeses or blue cheese. Nondairy creamers and whipped toppings. Processed cheese, cheese spreads, or cheese curds. Condiments Onion and garlic salt, seasoned salt, table salt, and sea salt. Canned and packaged gravies. Worcestershire sauce. Tartar sauce. Barbecue sauce. Teriyaki sauce. Soy sauce, including reduced sodium. Steak sauce. Fish sauce. Oyster sauce. Cocktail sauce. Horseradish. Ketchup and mustard. Meat flavorings and tenderizers. Bouillon cubes. Hot sauce. Tabasco sauce. Marinades. Taco seasonings. Relishes. Fats and Oils Butter, stick margarine, lard, shortening, ghee, and bacon fat. Coconut, palm kernel, or palm oils. Regular salad dressings. Other Pickles and olives. Salted popcorn and pretzels. The items listed above may not be a complete list of foods and beverages to avoid. Contact your dietitian for more information. WHERE CAN I FIND MORE INFORMATION? National Heart, Lung, and Blood Institute: CableProm[employer].itwww.nhlbi.nih.gov/health/health-topics/topics/dash/ Document Released: 11/14/2011 Document Revised: 04/11/2014 Document Reviewed: 09/29/2013 [facility] Patient Information 2015  [place]. This information is not intended to replace advice given to you by your health care provider. Make sure you discuss any questions you have with your health care provider.   Please monitor your blood pressure. If it is getting below 130/80 AND you are having fatigue with exertion, dizziness we may need to cut your blood pressure medication in half. Please call the office if this is happening. Hypotension As your heart beats, it forces blood through your body. This force is called blood pressure. If you have hypotension, you have low blood pressure. When your blood pressure is too low, you may not get enough blood to your brain. You may feel weak, feel lightheaded, have a fast heartbeat, or even pass out (faint). HOME CARE  Drink enough fluids to keep your pee (urine) clear or pale yellow.  Take all medicines as told by your doctor.  Get up slowly after sitting or lying down.  Wear support stockings as told by your doctor.  Maintain a healthy diet by including foods such as fruits, vegetables, nuts, whole grains, and lean meats. GET HELP IF:  You are throwing up (vomiting) or have watery poop (diarrhea).  You have a fever for more than 2-3 days.  You feel more thirsty than usual.  You feel weak and tired. GET HELP RIGHT AWAY IF:   You pass out (faint).  You have chest pain or a fast or irregular heartbeat.  You lose feeling in part of your body.  You cannot move your arms or legs.  You have trouble speaking.  You get  sweaty or feel lightheaded. MAKE SURE YOU:   Understand these instructions.  Will watch your condition.  Will get help right away if you are not doing well or get worse. Document Released: 02/19/2010 Document Revised: 07/28/2013 Document Reviewed: 05/28/2013 [facility] Patient Information 2015 [place], [place]. This information is not intended to replace advice given to you by your health care provider. Make sure you discuss any questions you have  with your health care provider.

## 2018-07-28 LAB — CBC WITH DIFFERENTIAL/PLATELET
Basophils Absolute: 80 cells/uL (ref 0–200)
Basophils Relative: 0.8 %
Eosinophils Absolute: 20 cells/uL (ref 15–500)
Eosinophils Relative: 0.2 %
HCT: 46.5 % (ref 38.5–50.0)
Hemoglobin: 16.3 g/dL (ref 13.2–17.1)
Lymphs Abs: 960 cells/uL (ref 850–3900)
MCH: 34.7 pg — ABNORMAL HIGH (ref 27.0–33.0)
MCHC: 35.1 g/dL (ref 32.0–36.0)
MCV: 98.9 fL (ref 80.0–100.0)
MPV: 9.5 fL (ref 7.5–12.5)
Monocytes Relative: 8.1 %
Neutro Abs: 8130 cells/uL — ABNORMAL HIGH (ref 1500–7800)
Neutrophils Relative %: 81.3 %
Platelets: 276 10*3/uL (ref 140–400)
RBC: 4.7 10*6/uL (ref 4.20–5.80)
RDW: 11.7 % (ref 11.0–15.0)
Total Lymphocyte: 9.6 %
WBC mixed population: 810 cells/uL (ref 200–950)
WBC: 10 10*3/uL (ref 3.8–10.8)

## 2018-07-28 LAB — TSH: TSH: 0.55 mIU/L (ref 0.40–4.50)

## 2018-07-28 LAB — COMPLETE METABOLIC PANEL WITH GFR
AG Ratio: 2 (calc) (ref 1.0–2.5)
ALT: 43 U/L (ref 9–46)
AST: 47 U/L — ABNORMAL HIGH (ref 10–35)
Albumin: 4.7 g/dL (ref 3.6–5.1)
Alkaline phosphatase (APISO): 45 U/L (ref 40–115)
BUN/Creatinine Ratio: 15 (calc) (ref 6–22)
BUN: 23 mg/dL (ref 7–25)
CO2: 29 mmol/L (ref 20–32)
Calcium: 9.8 mg/dL (ref 8.6–10.3)
Chloride: 98 mmol/L (ref 98–110)
Creat: 1.49 mg/dL — ABNORMAL HIGH (ref 0.70–1.25)
GFR, Est African American: 56 mL/min/{1.73_m2} — ABNORMAL LOW (ref >=60)
GFR, Est Non African American: 49 mL/min/{1.73_m2} — ABNORMAL LOW (ref >=60)
Globulin: 2.3 g/dL (calc) (ref 1.9–3.7)
Glucose, Bld: 102 mg/dL — ABNORMAL HIGH (ref 65–99)
Potassium: 4.9 mmol/L (ref 3.5–5.3)
Sodium: 138 mmol/L (ref 135–146)
Total Bilirubin: 0.7 mg/dL (ref 0.2–1.2)
Total Protein: 7 g/dL (ref 6.1–8.1)

## 2018-07-28 LAB — LIPID PANEL
Cholesterol: 194 mg/dL (ref <200)
HDL: 75 mg/dL (ref >40)
LDL Cholesterol (Calc): 89 mg/dL (calc)
Non-HDL Cholesterol (Calc): 119 mg/dL (calc) (ref <130)
Total CHOL/HDL Ratio: 2.6 (calc) (ref <5.0)
Triglycerides: 199 mg/dL — ABNORMAL HIGH (ref <150)

## 2018-07-28 LAB — VITAMIN D 25 HYDROXY (VIT D DEFICIENCY, FRACTURES): Vit D, 25-Hydroxy: 42 ng/mL (ref 30–100)

## 2018-08-13 ENCOUNTER — Encounter: Payer: Self-pay | Admitting: Internal Medicine

## 2018-09-01 NOTE — Progress Notes (Deleted)
Subjective:    Patient ID: [name] [name], male    DOB: 05/07/1951, 67 y.o.   MRN: [id number]  HPI 67 y.o. WM presents with back pain.  Larey SeatFell off a ladder in Feb/ march 2016, has seen Dr. Ophelia CharterYates and Dr. Yetta BarreJones for his back pain in the past, normal MRI. Had MRI back 07/2016. No help from epidural injections but had trigger point injection with pain management that helped. Tried dry needling and PT but it did not help. Last injections were a month ago however [place] and requesting exception to the injections.  Tried lyrica/gabapentin that did not help.   There were no vitals taken for this visit.  Medications Current Outpatient Medications on File Prior to Visit  Medication Sig  . acetaminophen (TYLENOL) 500 MG tablet Take 500 mg by mouth every 6 (six) hours as needed.  Marland Kitchen. amphetamine-dextroamphetamine (ADDERALL) 20 MG tablet   . atenolol (TENORMIN) 100 MG tablet TAKE 1 TABLET BY MOUTH DAILY  . atorvastatin (LIPITOR) 80 MG tablet Take 1 tablet daily for Cholesterol  . Cholecalciferol (VITAMIN D PO) Take 5,000 Units by mouth daily.   Marland Kitchen. esomeprazole (NEXIUM) 40 MG capsule TAKE ONE CAPSULE BY MOUTH DAILY AT NOON  . finasteride (PROSCAR) 5 MG tablet Take 1 tablet (5 mg total) by mouth daily.  Marland Kitchen. losartan-hydrochlorothiazide (HYZAAR) 100-25 MG tablet Take 1/2 to 1 tablet daily as directed for BP  . predniSONE (DELTASONE) 5 MG tablet Take 1 tablet 1 to 3 x/ day as directed  . valACYclovir (VALTREX) 500 MG tablet TAKE 1 TABLET BY MOUTH THREE TIMES DAILY FOR FEVER BLISTERS   No current facility-administered medications on file prior to visit.     Problem list He has Hypertension; Hyperlipidemia; GERD (gastroesophageal reflux disease); Vitamin D deficiency; Blood glucose abnormal; Medication management; Insulin resistance; Left hip pain; Avascular necrosis of left femoral head (HCC); Insomnia; Gout; DDD (degenerative disc disease), lumbosacral; and CKD (chronic kidney disease)  stage 3, GFR 30-59 ml/min (HCC) on their problem list.   Review of Systems  Constitutional: Negative for chills and fever.  Gastrointestinal: Negative for abdominal pain, constipation, diarrhea, nausea and vomiting.  Genitourinary: Negative for difficulty urinating, dysuria, frequency, hematuria and urgency.  Musculoskeletal: Positive for back pain and gait problem.       Objective:   Physical Exam  Constitutional: He is oriented to person, place, and time. He appears well-developed and well-nourished. No distress.  HENT:  Head: Normocephalic.  Mouth/Throat: Oropharynx is clear and moist. No oropharyngeal exudate.  Eyes: Conjunctivae are normal. No scleral icterus.  Neck: Normal range of motion. Neck supple.  Cardiovascular: Normal rate, regular rhythm, normal heart sounds and intact distal pulses. Exam reveals no gallop and no friction rub.  No murmur heard. Pulmonary/Chest: Effort normal and breath sounds normal. No respiratory distress. He has no wheezes. He has no rales. He exhibits no tenderness.  Abdominal: Soft. Bowel sounds are normal. He exhibits no distension and no mass. There is no tenderness. There is no rebound and no guarding.  Musculoskeletal:       Right hip: He exhibits decreased range of motion. He exhibits normal strength, no tenderness, no bony tenderness, no swelling, no crepitus, no deformity and no laceration.       Lumbar back: He exhibits decreased range of motion, tenderness and pain. He exhibits no bony tenderness, no swelling, no edema, no deformity, no laceration, no spasm and normal pulse.  Walks with antalgic gait, + pinpoint tenderness  bilateral lower back, negative straight leg, normal distal neurovascular exam.   Neurological: He is alert and oriented to person, place, and time.  Skin: Skin is warm and dry. He is not diaphoretic.  Psychiatric: He has a normal mood and affect. His behavior is normal. Judgment and thought content normal.  Nursing note  and vitals reviewed.     Assessment & Plan:   Chronic left-sided low back pain with left-sided sciatica Trigger point injection bilateral, handled well, no bleeding - dexamethasone (DECADRON) injection 20 mg; Inject 2 mLs (20 mg total) into the muscle once.  Future Appointments  Date Time Provider Department Center  09/04/2018 11:15 AM Quentin Mulling, PA-C [facility] None  10/09/2018 10:00 AM Lucky Cowboy, MD [facility] None

## 2018-09-04 ENCOUNTER — Ambulatory Visit: Payer: Self-pay | Admitting: Physician Assistant

## 2018-10-09 ENCOUNTER — Encounter: Payer: Self-pay | Admitting: Internal Medicine

## 2018-10-09 ENCOUNTER — Ambulatory Visit (INDEPENDENT_AMBULATORY_CARE_PROVIDER_SITE_OTHER): Payer: BLUE CROSS/BLUE SHIELD | Admitting: Internal Medicine

## 2018-10-09 ENCOUNTER — Other Ambulatory Visit: Payer: Self-pay | Admitting: *Deleted

## 2018-10-09 VITALS — BP 132/80 | HR 68 | Temp 97.4°F | Resp 16 | Ht 67.0 in | Wt 151.0 lb

## 2018-10-09 DIAGNOSIS — Z130 Encounter for screening for diseases of the blood and blood-forming organs and certain disorders involving the immune mechanism: Secondary | ICD-10-CM | POA: Diagnosis not present

## 2018-10-09 DIAGNOSIS — Z79899 Other long term (current) drug therapy: Secondary | ICD-10-CM | POA: Diagnosis not present

## 2018-10-09 DIAGNOSIS — Z0001 Encounter for general adult medical examination with abnormal findings: Secondary | ICD-10-CM

## 2018-10-09 DIAGNOSIS — Z13220 Encounter for screening for lipoid disorders: Secondary | ICD-10-CM

## 2018-10-09 DIAGNOSIS — Z125 Encounter for screening for malignant neoplasm of prostate: Secondary | ICD-10-CM

## 2018-10-09 DIAGNOSIS — G4700 Insomnia, unspecified: Secondary | ICD-10-CM

## 2018-10-09 DIAGNOSIS — Z1212 Encounter for screening for malignant neoplasm of rectum: Secondary | ICD-10-CM

## 2018-10-09 DIAGNOSIS — M5441 Lumbago with sciatica, right side: Secondary | ICD-10-CM

## 2018-10-09 DIAGNOSIS — Z136 Encounter for screening for cardiovascular disorders: Secondary | ICD-10-CM

## 2018-10-09 DIAGNOSIS — K219 Gastro-esophageal reflux disease without esophagitis: Secondary | ICD-10-CM

## 2018-10-09 DIAGNOSIS — Z0000 Encounter for general adult medical examination without abnormal findings: Secondary | ICD-10-CM

## 2018-10-09 DIAGNOSIS — Z131 Encounter for screening for diabetes mellitus: Secondary | ICD-10-CM

## 2018-10-09 DIAGNOSIS — E8881 Metabolic syndrome: Secondary | ICD-10-CM

## 2018-10-09 DIAGNOSIS — R350 Frequency of micturition: Secondary | ICD-10-CM | POA: Diagnosis not present

## 2018-10-09 DIAGNOSIS — I10 Essential (primary) hypertension: Secondary | ICD-10-CM | POA: Diagnosis not present

## 2018-10-09 DIAGNOSIS — Z1211 Encounter for screening for malignant neoplasm of colon: Secondary | ICD-10-CM

## 2018-10-09 DIAGNOSIS — E88819 Insulin resistance, unspecified: Secondary | ICD-10-CM

## 2018-10-09 DIAGNOSIS — Z8249 Family history of ischemic heart disease and other diseases of the circulatory system: Secondary | ICD-10-CM

## 2018-10-09 DIAGNOSIS — N401 Enlarged prostate with lower urinary tract symptoms: Secondary | ICD-10-CM | POA: Diagnosis not present

## 2018-10-09 DIAGNOSIS — Z1389 Encounter for screening for other disorder: Secondary | ICD-10-CM

## 2018-10-09 DIAGNOSIS — R7309 Other abnormal glucose: Secondary | ICD-10-CM

## 2018-10-09 DIAGNOSIS — E559 Vitamin D deficiency, unspecified: Secondary | ICD-10-CM

## 2018-10-09 DIAGNOSIS — Z1329 Encounter for screening for other suspected endocrine disorder: Secondary | ICD-10-CM

## 2018-10-09 DIAGNOSIS — M25551 Pain in right hip: Secondary | ICD-10-CM

## 2018-10-09 DIAGNOSIS — R5383 Other fatigue: Secondary | ICD-10-CM

## 2018-10-09 DIAGNOSIS — N183 Chronic kidney disease, stage 3 unspecified: Secondary | ICD-10-CM

## 2018-10-09 DIAGNOSIS — Z111 Encounter for screening for respiratory tuberculosis: Secondary | ICD-10-CM | POA: Diagnosis not present

## 2018-10-09 DIAGNOSIS — E782 Mixed hyperlipidemia: Secondary | ICD-10-CM

## 2018-10-09 DIAGNOSIS — G8929 Other chronic pain: Secondary | ICD-10-CM

## 2018-10-09 MED ORDER — LORAZEPAM 1 MG PO TABS: ORAL_TABLET | ORAL | 0 refills | Status: DC

## 2018-10-09 MED ORDER — ATENOLOL 100 MG PO TABS: ORAL_TABLET | ORAL | 1 refills | Status: DC

## 2018-10-09 MED ORDER — PREDNISONE 5 MG PO TABS: ORAL_TABLET | ORAL | 1 refills | Status: DC

## 2018-10-09 NOTE — Patient Instructions (Signed)

## 2018-10-09 NOTE — Progress Notes (Signed)
[facility]   Lucky Cowboy, M.D.     Dyanne Carrel. Steffanie Dunn, P.A.-C Judd Gaudier, DNP [facility]                [address]                [place], [place]. [address] Telephone [phone] Telefax [phone] Annual  Screening/Preventative Visit  & Comprehensive Evaluation & Examination     This very nice 67 y.o. DWM presents for a Screening /Preventative Visit & comprehensive evaluation and management of multiple medical co-morbidities.  Patient has been followed for HTN, HLD, Prediabetes and Vitamin D Deficiency.Patient has GERD controlled w/his meds. He also has ADD treated w/Adderal with improved focus & concentration in his work environment.      HTN predates since 72. Patient's BP has been controlled at home.  Today's BP is at goal -  132/80. Patient denies any cardiac symptoms as chest pain, palpitations, shortness of breath, dizziness or ankle swelling.     Patient's hyperlipidemia is controlled with diet and medications. Patient denies myalgias or other medication SE's. Last lipids were at goal albeit elevated Trig's: Lab Results  Component Value Date   CHOL 194 07/27/2018   HDL 75 07/27/2018   LDLCALC 89 07/27/2018   TRIG 199 (H) 07/27/2018   CHOLHDL 2.6 07/27/2018      Patient has hx/o prediabetes/Insulin Resistance (Insulin 28.6 / Aug 2016 & 33.7 / June 2018) and patient denies reactive hypoglycemic symptoms, visual blurring, diabetic polys or paresthesias. Last A1c was Normal & at goal: Lab Results  Component Value Date   HGBA1C 5.0 03/24/2018       Finally, patient has history of Vitamin D Deficiency ("28" / 2008)  and last vitamin D was still low: Lab Results  Component Value Date   VD25OH 42 07/27/2018   Current Outpatient Medications on File Prior to Visit  Medication Sig  . acetaminophen (TYLENOL) 500 MG tablet Take 500 mg by mouth every 6 (six) hours as needed.  Marland Kitchen  amphetamine-dextroamphetamine (ADDERALL) 20 MG tablet   . atorvastatin (LIPITOR) 80 MG tablet Take 1 tablet daily for Cholesterol  . Cholecalciferol (VITAMIN D PO) Take 5,000 Units by mouth daily.   Marland Kitchen esomeprazole (NEXIUM) 40 MG capsule TAKE ONE CAPSULE BY MOUTH DAILY AT NOON  . finasteride (PROSCAR) 5 MG tablet Take 1 tablet (5 mg total) by mouth daily.  Marland Kitchen losartan-hydrochlorothiazide (HYZAAR) 100-25 MG tablet Take 1/2 to 1 tablet daily as directed for BP  . predniSONE (DELTASONE) 5 MG tablet Take 1 tablet 1 to 3 x/ day as directed  . valACYclovir (VALTREX) 500 MG tablet TAKE 1 TABLET BY MOUTH THREE TIMES DAILY FOR FEVER BLISTERS   No current facility-administered medications on file prior to visit.    Allergies  Allergen Reactions  . Nsaids     Renal Insufficiency   Past Medical History:  Diagnosis Date  . Arthritis    right thumb  . Dry eyes    Takes Restasis  . GERD (gastroesophageal reflux disease)   . Hyperlipidemia   . Hypertension   . Vitamin D deficiency    Health Maintenance  Topic Date Due  . TETANUS/TDAP  01/19/2019  . PNA vac Low Risk Adult (2 of 2 - PPSV23) 01/22/2019  . COLONOSCOPY  07/14/2028  . INFLUENZA VACCINE  Completed  . Hepatitis C Screening  Completed   Immunization History  Administered Date(s) Administered  . Influenza Split  09/22/2013  . Influenza-Unspecified 09/08/2017, 08/19/2018  . PPD Test 09/13/2014, 07/25/2015, 08/08/2016, 09/08/2017  . Pneumococcal Conjugate-13 01/22/2018  . Pneumococcal Polysaccharide-23 01/19/2009  . Tdap 01/19/2009  . Zoster 01/29/2013   Last Colon - 07/14/2018 - Dr Randa Evens - Negative - Recc 10 yr f/u due Aug 2029  Past Surgical History:  Procedure Laterality Date  . COLONOSCOPY W/ POLYPECTOMY    . ESOPHAGOGASTRODUODENOSCOPY  1990  . 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 Osteoporosis Mother   . Schizophrenia Mother   . Heart disease Father   . COPD Father   . Diabetes Brother    Social History   Socioeconomic History  . Marital status: Divorced    Spouse name: Not on file  . Number of children: Not on file  . Years of education: Not on file  . Highest education level: Not on file  Occupational History  . 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 Smoking status: Never Smoker  . Smokeless tobacco: Never Used  Substance and Sexual Activity  . Alcohol use: Yes    Comment: social  . Drug use: No  . Sexual activity: Not on file    ROS Constitutional: Denies fever, chills, weight loss/gain, headaches, insomnia,  night sweats or change in appetite. Does c/o fatigue. Eyes: Denies redness, blurred vision, diplopia, discharge, itchy or watery eyes.  ENT: Denies discharge, congestion, post nasal drip, epistaxis, sore throat, earache, hearing loss, dental pain, Tinnitus, Vertigo, Sinus pain or snoring.  Cardio: Denies chest pain, palpitations, irregular heartbeat, syncope, dyspnea, diaphoresis, orthopnea, PND, claudication or edema Respiratory: denies cough, dyspnea, DOE, pleurisy, hoarseness, laryngitis or wheezing.  Gastrointestinal: Denies dysphagia, heartburn, reflux, water brash, pain, cramps, nausea, vomiting, bloating, diarrhea, constipation, hematemesis, melena, hematochezia, jaundice or hemorrhoids Genitourinary: Denies dysuria, frequency, urgency, nocturia, hesitancy, discharge, hematuria or flank pain Musculoskeletal: Denies arthralgia, myalgia, stiffness, Jt. Swelling, pain, limp or strain/sprain. Denies Falls. Skin: Denies puritis, rash, hives, warts, acne, eczema or change in skin lesion Neuro: No weakness, tremor, incoordination, spasms, paresthesia or pain Psychiatric: Denies confusion, memory loss or sensory loss. Denies Depression. Endocrine:  Denies change in weight, skin, hair change, nocturia, and paresthesia, diabetic polys, visual blurring or hyper / hypo glycemic episodes.  Heme/Lymph: No excessive bleeding, bruising or enlarged lymph nodes.  Physical Exam  BP 132/80   Pulse 68   Temp (!) 97.4 F (36.3 C)   Resp 16   Ht 5\' 7"  (1.702 m)   Wt 151 lb (68.5 kg)   BMI 23.65 kg/m   General Appearance: Well nourished and well groomed and in no apparent distress.  Eyes: PERRLA, EOMs, conjunctiva no swelling or erythema, normal fundi and vessels. Sinuses: No frontal/maxillary tenderness ENT/Mouth: EACs patent / TMs  nl. Nares clear without erythema, swelling, mucoid exudates. Oral hygiene is good. No erythema, swelling, or exudate. Tongue normal, non-obstructing. Tonsils not swollen or erythematous. Hearing normal.  Neck: Supple, thyroid not palpable. No bruits, nodes or JVD. Respiratory: Respiratory effort normal.  BS equal and clear bilateral without rales, rhonci, wheezing or stridor. Cardio: Heart sounds are normal with regular rate and rhythm and no murmurs, rubs or gallops. Peripheral pulses are normal and equal bilaterally without edema. No aortic or femoral bruits. Chest: symmetric  with normal excursions and percussion.  Abdomen: Soft, with Nl bowel sounds. Nontender, no guarding, rebound, hernias, masses, or organomegaly.  Lymphatics: Non tender without lymphadenopathy.  Genitourinary: No hernias.Testes nl. DRE - prostate nl for age - smooth & firm w/o nodules. Musculoskeletal: Full ROM all peripheral extremities, joint stability, 5/5 strength, and normal gait. Skin: Warm and dry without rashes, lesions, cyanosis, clubbing or  ecchymosis.  Neuro: Cranial nerves intact, reflexes equal bilaterally. Normal muscle tone, no cerebellar symptoms. Sensation intact.  Pysch: Alert and oriented X 3 with normal affect, insight and judgment appropriate.   Assessment and Plan  1. Annual Preventative/Screening Exam   2.  Essential hypertension  - EKG 12-Lead - [place], RETROPERITNL ABD,  LTD - Urinalysis, Routine w reflex microscopic - Microalbumin / creatinine urine ratio - CBC with Differential/Platelet - COMPLETE METABOLIC PANEL WITH GFR - Magnesium - TSH  3. Hyperlipidemia, mixed  - EKG 12-Lead - [place], RETROPERITNL ABD,  LTD - Lipid panel - TSH  4. Abnormal glucose  - EKG 12-Lead - [place], RETROPERITNL ABD,  LTD - Hemoglobin A1c - Insulin, random  5. Vitamin D deficiency  - VITAMIN D 25 Hydroxyl  6. Insulin resistance  - Hemoglobin A1c - Insulin, random  7. CKD (chronic kidney disease) stage 3, GFR 30-59 ml/min (HCC)  - COMPLETE METABOLIC PANEL WITH GFR  8. Gastroesophageal reflux disease  - CBC with Differential/Platelet  9. Screening for colorectal cancer  - POC Hemoccult Bld/Stl   10. Screening for prostate cancer  - PSA  11. Screening for ischemic heart disease  - EKG 12-Lead  12. FHx: heart disease  - EKG 12-Lead - [place], RETROPERITNL ABD,  LTD  13. Screening for AAA (aortic abdominal aneurysm)  - [place], RETROPERITNL ABD,  LTD  14. Screening examination for pulmonary tuberculosis   15. Fatigue  - Iron,Total/Total Iron Binding Cap - Vitamin B12 - Testosterone - CBC with Differential/Platelet  16. Medication management  - Urinalysis, Routine w reflex microscopic - Microalbumin / creatinine urine ratio - CBC with Differential/Platelet - COMPLETE METABOLIC PANEL WITH GFR - Magnesium - Lipid panel - TSH - Hemoglobin A1c - Insulin, random - VITAMIN D 25 Hydroxyl        Patient was counseled in prudent diet, weight control to achieve/maintain BMI less than 25, BP monitoring, regular exercise and medications as discussed.  Discussed med effects and SE's. Routine screening labs and tests as requested with regular follow-up as recommended. Over 40 minutes of exam, counseling, chart review and high complex critical decision making was performed

## 2018-10-12 ENCOUNTER — Other Ambulatory Visit: Payer: Self-pay | Admitting: Internal Medicine

## 2018-10-12 DIAGNOSIS — N289 Disorder of kidney and ureter, unspecified: Secondary | ICD-10-CM

## 2018-10-12 LAB — URINALYSIS, ROUTINE W REFLEX MICROSCOPIC
Bilirubin Urine: NEGATIVE
Glucose, UA: NEGATIVE
Hgb urine dipstick: NEGATIVE
Ketones, ur: NEGATIVE
Leukocytes, UA: NEGATIVE
Nitrite: NEGATIVE
Protein, ur: NEGATIVE
Specific Gravity, Urine: 1.011 (ref 1.001–1.03)
pH: 6 (ref 5.0–8.0)

## 2018-10-12 LAB — MICROALBUMIN / CREATININE URINE RATIO
Creatinine, Urine: 101 mg/dL (ref 20–320)
Microalb Creat Ratio: 2 mcg/mg creat (ref <30)
Microalb, Ur: 0.2 mg/dL

## 2018-10-12 LAB — COMPLETE METABOLIC PANEL WITH GFR
AG Ratio: 2.2 (calc) (ref 1.0–2.5)
ALT: 23 U/L (ref 9–46)
AST: 23 U/L (ref 10–35)
Albumin: 4.6 g/dL (ref 3.6–5.1)
Alkaline phosphatase (APISO): 51 U/L (ref 40–115)
BUN/Creatinine Ratio: 14 (calc) (ref 6–22)
BUN: 25 mg/dL (ref 7–25)
CO2: 28 mmol/L (ref 20–32)
Calcium: 9.4 mg/dL (ref 8.6–10.3)
Chloride: 102 mmol/L (ref 98–110)
Creat: 1.83 mg/dL — ABNORMAL HIGH (ref 0.70–1.25)
GFR, Est African American: 44 mL/min/{1.73_m2} — ABNORMAL LOW (ref >=60)
GFR, Est Non African American: 38 mL/min/{1.73_m2} — ABNORMAL LOW (ref >=60)
Globulin: 2.1 g/dL (calc) (ref 1.9–3.7)
Glucose, Bld: 116 mg/dL — ABNORMAL HIGH (ref 65–99)
Potassium: 4 mmol/L (ref 3.5–5.3)
Sodium: 139 mmol/L (ref 135–146)
Total Bilirubin: 0.6 mg/dL (ref 0.2–1.2)
Total Protein: 6.7 g/dL (ref 6.1–8.1)

## 2018-10-12 LAB — LIPID PANEL
Cholesterol: 191 mg/dL (ref <200)
HDL: 63 mg/dL (ref >40)
LDL Cholesterol (Calc): 102 mg/dL (calc) — ABNORMAL HIGH
Non-HDL Cholesterol (Calc): 128 mg/dL (calc) (ref <130)
Total CHOL/HDL Ratio: 3 (calc) (ref <5.0)
Triglycerides: 157 mg/dL — ABNORMAL HIGH (ref <150)

## 2018-10-12 LAB — CBC WITH DIFFERENTIAL/PLATELET
Basophils Absolute: 69 cells/uL (ref 0–200)
Basophils Relative: 1.1 %
Eosinophils Absolute: 63 cells/uL (ref 15–500)
Eosinophils Relative: 1 %
HCT: 39.9 % (ref 38.5–50.0)
Hemoglobin: 14 g/dL (ref 13.2–17.1)
Lymphs Abs: 1550 cells/uL (ref 850–3900)
MCH: 35.5 pg — ABNORMAL HIGH (ref 27.0–33.0)
MCHC: 35.1 g/dL (ref 32.0–36.0)
MCV: 101.3 fL — ABNORMAL HIGH (ref 80.0–100.0)
MPV: 9.7 fL (ref 7.5–12.5)
Monocytes Relative: 11.2 %
Neutro Abs: 3912 cells/uL (ref 1500–7800)
Neutrophils Relative %: 62.1 %
Platelets: 278 10*3/uL (ref 140–400)
RBC: 3.94 10*6/uL — ABNORMAL LOW (ref 4.20–5.80)
RDW: 11.7 % (ref 11.0–15.0)
Total Lymphocyte: 24.6 %
WBC mixed population: 706 cells/uL (ref 200–950)
WBC: 6.3 10*3/uL (ref 3.8–10.8)

## 2018-10-12 LAB — HEMOGLOBIN A1C
Hgb A1c MFr Bld: 5 % of total Hgb (ref <5.7)
Mean Plasma Glucose: 97 (calc)
eAG (mmol/L): 5.4 (calc)

## 2018-10-12 LAB — IRON, TOTAL/TOTAL IRON BINDING CAP
%SAT: 36 % (calc) (ref 20–48)
Iron: 120 ug/dL (ref 50–180)
TIBC: 336 mcg/dL (calc) (ref 250–425)

## 2018-10-12 LAB — PSA: PSA: 0.6 ng/mL (ref <=4.0)

## 2018-10-12 LAB — TESTOSTERONE: Testosterone: 500 ng/dL (ref 250–827)

## 2018-10-12 LAB — VITAMIN B12: Vitamin B-12: 272 pg/mL (ref 200–1100)

## 2018-10-12 LAB — VITAMIN D 25 HYDROXY (VIT D DEFICIENCY, FRACTURES): Vit D, 25-Hydroxy: 46 ng/mL (ref 30–100)

## 2018-10-12 LAB — INSULIN, RANDOM: Insulin: 35.8 u[IU]/mL — ABNORMAL HIGH (ref 2.0–19.6)

## 2018-10-12 LAB — TSH: TSH: 2.19 mIU/L (ref 0.40–4.50)

## 2018-10-12 LAB — MAGNESIUM: Magnesium: 1.5 mg/dL (ref 1.5–2.5)

## 2018-10-13 ENCOUNTER — Encounter: Payer: Self-pay | Admitting: *Deleted

## 2018-10-22 ENCOUNTER — Ambulatory Visit: Payer: BLUE CROSS/BLUE SHIELD | Admitting: Physician Assistant

## 2018-10-22 ENCOUNTER — Encounter: Payer: Self-pay | Admitting: Physician Assistant

## 2018-10-22 VITALS — BP 128/80 | HR 76 | Temp 98.1°F | Resp 14 | Ht 67.0 in | Wt 156.2 lb

## 2018-10-22 DIAGNOSIS — M545 Low back pain, unspecified: Secondary | ICD-10-CM

## 2018-10-22 DIAGNOSIS — G8929 Other chronic pain: Secondary | ICD-10-CM | POA: Diagnosis not present

## 2018-10-22 MED ORDER — DEXAMETHASONE SODIUM PHOSPHATE 100 MG/10ML IJ SOLN
20.0000 mg | Freq: Once | INTRAMUSCULAR | Status: AC
  Administered 2018-10-22: 20 mg via INTRAMUSCULAR

## 2018-10-22 NOTE — Progress Notes (Signed)
Subjective:    Patient ID: [name] [name], male    DOB: 1951-07-05, 67 y.o.   MRN: [id number]  HPI 67 y.o. WM presents with back pain.  Larey Seat off a ladder in Feb/ march 2016, has seen Dr. Ophelia Charter and Dr. [name] for his back pain in the past, normal MRI. Had MRI back 07/2016. No help from epidural injections but had trigger point injection with pain management that helped. Tried dry needling and PT but it did not help. Last injections were 3 months ago. Tried lyrica/gabapentin that did not help.   Blood pressure 128/80, pulse 76, temperature 98.1 F (36.7 C), resp. rate 14, height 5\' 7"  (1.702 m), weight 156 lb 3.2 oz (70.9 kg), SpO2 97 %.  Medications Current Outpatient Medications on File Prior to Visit  Medication Sig  . acetaminophen (TYLENOL) 500 MG tablet Take 500 mg by mouth every 6 (six) hours as needed.  Marland Kitchen amphetamine-dextroamphetamine (ADDERALL) 20 MG tablet   . atenolol (TENORMIN) 100 MG tablet TAKE 1 TABLET BY MOUTH DAILY  . atorvastatin (LIPITOR) 80 MG tablet Take 1 tablet daily for Cholesterol  . Cholecalciferol (VITAMIN D PO) Take 5,000 Units by mouth daily.   Marland Kitchen esomeprazole (NEXIUM) 40 MG capsule TAKE ONE CAPSULE BY MOUTH DAILY AT NOON  . finasteride (PROSCAR) 5 MG tablet Take 1 tablet (5 mg total) by mouth daily.  Marland Kitchen LORazepam (ATIVAN) 1 MG tablet Take 1/2 to 1 tablet at Bedtime as needed for Sleep  . predniSONE (DELTASONE) 5 MG tablet Take 1 tablet 1 to 3 x/ day as directed  . valACYclovir (VALTREX) 500 MG tablet TAKE 1 TABLET BY MOUTH THREE TIMES DAILY FOR FEVER BLISTERS  . losartan-hydrochlorothiazide (HYZAAR) 100-25 MG tablet Take 1/2 to 1 tablet daily as directed for BP   No current facility-administered medications on file prior to visit.     Problem list He has Hypertension; Hyperlipidemia; GERD (gastroesophageal reflux disease); Vitamin D deficiency; Blood glucose abnormal; Medication management; Insulin resistance; Left hip pain; Avascular necrosis of left  femoral head (HCC); Insomnia; Gout; DDD (degenerative disc disease), lumbosacral; and CKD (chronic kidney disease) stage 3, GFR 30-59 ml/min (HCC) on their problem list.   Review of Systems  Constitutional: Negative for chills and fever.  Gastrointestinal: Negative for abdominal pain, constipation, diarrhea, nausea and vomiting.  Genitourinary: Negative for difficulty urinating, dysuria, frequency, hematuria and urgency.  Musculoskeletal: Positive for back pain and gait problem.       Objective:   Physical Exam  Constitutional: He is oriented to person, place, and time. He appears well-developed and well-nourished. No distress.  HENT:  Head: Normocephalic.  Mouth/Throat: Oropharynx is clear and moist. No oropharyngeal exudate.  Eyes: Conjunctivae are normal. No scleral icterus.  Neck: Normal range of motion. Neck supple.  Cardiovascular: Normal rate, regular rhythm, normal heart sounds and intact distal pulses. Exam reveals no gallop and no friction rub.  No murmur heard. Pulmonary/Chest: Effort normal and breath sounds normal. No respiratory distress. He has no wheezes. He has no rales. He exhibits no tenderness.  Abdominal: Soft. Bowel sounds are normal. He exhibits no distension and no mass. There is no tenderness. There is no rebound and no guarding.  Musculoskeletal:       Right hip: He exhibits decreased range of motion. He exhibits normal strength, no tenderness, no bony tenderness, no swelling, no crepitus, no deformity and no laceration.       Lumbar back: He exhibits decreased range of motion, tenderness and pain. He  exhibits no bony tenderness, no swelling, no edema, no deformity, no laceration, no spasm and normal pulse.  Walks with antalgic gait, + pinpoint tenderness bilateral lower back, negative straight leg, normal distal neurovascular exam.   Neurological: He is alert and oriented to person, place, and time.  Skin: Skin is warm and dry. He is not diaphoretic.   Psychiatric: He has a normal mood and affect. His behavior is normal. Judgment and thought content normal.  Nursing note and vitals reviewed.     Assessment & Plan:   Chronic left-sided low back pain with left-sided sciatica Trigger point injection bilateral, handled well, no bleeding Baclofen PRN - dexamethasone (DECADRON) injection 20 mg; Inject 2 mLs (20 mg total) into the muscle once.  Future Appointments  Date Time Provider Department Center  01/19/2019 10:45 AM Judd Gaudierorbett, Ashley, NP [facility] None  04/22/2019 11:00 AM Lucky CowboyMcKeown, William, MD [facility] None  10/27/2019 10:00 AM Lucky CowboyMcKeown, William, MD [facility] None

## 2018-12-14 ENCOUNTER — Other Ambulatory Visit: Payer: Self-pay | Admitting: Internal Medicine

## 2018-12-16 ENCOUNTER — Ambulatory Visit: Payer: BLUE CROSS/BLUE SHIELD | Admitting: Physician Assistant

## 2018-12-16 ENCOUNTER — Encounter: Payer: Self-pay | Admitting: Physician Assistant

## 2018-12-16 VITALS — BP 120/82 | HR 68 | Temp 97.8°F | Ht 67.0 in | Wt 158.0 lb

## 2018-12-16 DIAGNOSIS — M545 Low back pain, unspecified: Secondary | ICD-10-CM

## 2018-12-16 DIAGNOSIS — G8929 Other chronic pain: Secondary | ICD-10-CM | POA: Diagnosis not present

## 2018-12-16 MED ORDER — DEXAMETHASONE SODIUM PHOSPHATE 100 MG/10ML IJ SOLN
20.0000 mg | Freq: Once | INTRAMUSCULAR | Status: AC
  Administered 2018-12-16: 20 mg via INTRAMUSCULAR

## 2018-12-16 MED ORDER — TRAMADOL HCL 50 MG PO TABS: 50.0000 mg | ORAL_TABLET | ORAL | 0 refills | Status: DC | PRN

## 2018-12-16 MED ORDER — NORTRIPTYLINE HCL 10 MG PO CAPS: 10.0000 mg | ORAL_CAPSULE | Freq: Three times a day (TID) | ORAL | 2 refills | Status: DC | PRN

## 2018-12-16 MED ORDER — TRAMADOL HCL 50 MG PO TABS: 50.0000 mg | ORAL_TABLET | ORAL | 0 refills | Status: AC | PRN

## 2018-12-16 NOTE — Progress Notes (Signed)
Subjective:    Patient ID: [name] [name], male    DOB: 1951-04-22, 68 y.o.   MRN: [id number]  HPI 68 y.o. WM presents with back pain.  Larey Seat off a ladder in Feb/ march 2016, has seen Dr. Ophelia Charter and Dr. [name] for his back pain in the past, Had MRI back 07/2016 NORMAL.  No help from epidural injections but had trigger point injection with pain management that helped.  Tried dry needling and PT but it did not help.   Tried lyrica/gabapentin that did not help- he did retry recently and had dizziness even with the 100.  Cymbalta also caused dizziness.  Muscle relaxer make him feel tired.  He states tylenol 3000 helps some. Tramadol helps some but takes very rarely if in a lot of pain.   Lab Results  Component Value Date   GFRNONAA 38 (L) 10/09/2018    Blood pressure 120/82, pulse 68, temperature 97.8 F (36.6 C), height 5\' 7"  (1.702 m), weight 158 lb (71.7 kg), SpO2 99 %.  Medications Current Outpatient Medications on File Prior to Visit  Medication Sig  . acetaminophen (TYLENOL) 500 MG tablet Take 500 mg by mouth every 6 (six) hours as needed.  Marland Kitchen amphetamine-dextroamphetamine (ADDERALL) 20 MG tablet   . atenolol (TENORMIN) 100 MG tablet TAKE 1 TABLET BY MOUTH DAILY  . atorvastatin (LIPITOR) 80 MG tablet Take 1 tablet daily for Cholesterol  . Cholecalciferol (VITAMIN D PO) Take 5,000 Units by mouth daily.   Marland Kitchen esomeprazole (NEXIUM) 40 MG capsule TAKE ONE CAPSULE BY MOUTH DAILY AT NOON  . finasteride (PROSCAR) 5 MG tablet Take 1 tablet (5 mg total) by mouth daily.  Marland Kitchen LORazepam (ATIVAN) 1 MG tablet Take 1/2 to 1 tablet at Bedtime as needed for Sleep  . valACYclovir (VALTREX) 500 MG tablet TAKE 1 TABLET BY MOUTH THREE TIMES DAILY FOR FEVER BLISTERS  . losartan-hydrochlorothiazide (HYZAAR) 100-25 MG tablet Take 1/2 to 1 tablet daily as directed for BP  . predniSONE (DELTASONE) 5 MG tablet Take 1 tablet 1 to 3 x/ day as directed (Patient not taking: Reported on 12/16/2018)   No current  facility-administered medications on file prior to visit.     Problem list He has Hypertension; Hyperlipidemia; GERD (gastroesophageal reflux disease); Vitamin D deficiency; Blood glucose abnormal; Medication management; Insulin resistance; Left hip pain; Avascular necrosis of left femoral head (HCC); Insomnia; Gout; DDD (degenerative disc disease), lumbosacral; and CKD (chronic kidney disease) stage 3, GFR 30-59 ml/min (HCC) on their problem list.   Review of Systems  Constitutional: Negative for chills and fever.  Gastrointestinal: Negative for abdominal pain, constipation, diarrhea, nausea and vomiting.  Genitourinary: Negative for difficulty urinating, dysuria, frequency, hematuria and urgency.  Musculoskeletal: Positive for back pain and gait problem.       Objective:   Physical Exam  Constitutional: He is oriented to person, place, and time. He appears well-developed and well-nourished. No distress.  HENT:  Head: Normocephalic.  Mouth/Throat: Oropharynx is clear and moist. No oropharyngeal exudate.  Eyes: Conjunctivae are normal. No scleral icterus.  Neck: Normal range of motion. Neck supple.  Cardiovascular: Normal rate, regular rhythm, normal heart sounds and intact distal pulses. Exam reveals no gallop and no friction rub.  No murmur heard. Pulmonary/Chest: Effort normal and breath sounds normal. No respiratory distress. He has no wheezes. He has no rales. He exhibits no tenderness.  Abdominal: Soft. Bowel sounds are normal. He exhibits no distension and no mass. There is no abdominal tenderness. There is  no rebound and no guarding.  Musculoskeletal:     Right hip: He exhibits decreased range of motion. He exhibits normal strength, no tenderness, no bony tenderness, no swelling, no crepitus, no deformity and no laceration.     Lumbar back: He exhibits decreased range of motion, tenderness and pain. He exhibits no bony tenderness, no swelling, no edema, no deformity, no  laceration, no spasm and normal pulse.     Comments: Walks with antalgic gait, + pinpoint tenderness bilateral lower back, negative straight leg, normal distal neurovascular exam.   Neurological: He is alert and oriented to person, place, and time.  Skin: Skin is warm and dry. He is not diaphoretic.  Psychiatric: He has a normal mood and affect. His behavior is normal. Judgment and thought content normal.  Nursing note and vitals reviewed.     Assessment & Plan:   Chronic left-sided low back pain with left-sided sciatica Trigger point injection bilateral, handled well, no bleeding Baclofen PRN - dexamethasone (DECADRON) injection 20 mg; Inject 2 mLs (20 mg total) into the muscle once. -     nortriptyline (PAMELOR) 10 MG capsule; Take 1 capsule (10 mg total) by mouth 3 (three) times daily as needed (pain). -     Discontinue: traMADol (ULTRAM) 50 MG tablet; Take 1 tablet (50 mg total) by mouth every 4 (four) hours as needed for up to 5 days. 1/2-1 tablet at night for cough -     dexamethasone (DECADRON) injection 20 mg -     Discontinue: traMADol (ULTRAM) 50 MG tablet; Take 1 tablet (50 mg total) by mouth every 4 (four) hours as needed for up to 5 days. 1/2-1 tablet at night for cough -     traMADol (ULTRAM) 50 MG tablet; Take 1 tablet (50 mg total) by mouth every 4 (four) hours as needed for up to 5 days. 1/2-1 tablet at night for cough     Future Appointments  Date Time Provider Department Center  12/16/2018  2:30 PM Quentin MullingCollier, Riyaan Heroux, PA-C [facility] None  01/19/2019 10:45 AM Judd Gaudierorbett, Ashley, NP [facility] None  04/22/2019 11:00 AM Lucky CowboyMcKeown, William, MD [facility] None  10/27/2019 10:00 AM Lucky CowboyMcKeown, William, MD [facility] None

## 2018-12-16 NOTE — Patient Instructions (Signed)
Trigger Point Injection Trigger points are areas where you have pain. A trigger point injection is a shot given in the trigger point to help relieve pain for a few days to a few months. Common places for trigger points include:  The neck.  The shoulders.  The upper back.  The lower back. A trigger point injection will not cure long-lasting (chronic) pain permanently. These injections do not always work for every person, but for some people they can help to relieve pain for a few days to a few months. Tell a health care provider about:  Any allergies you have.  All medicines you are taking, including vitamins, herbs, eye drops, creams, and over-the-counter medicines.  Any problems you or family members have had with anesthetic medicines.  Any blood disorders you have.  Any surgeries you have had.  Any medical conditions you have. What are the risks? Generally, this is a safe procedure. However, problems may occur, including:  Infection.  Bleeding.  Allergic reaction to the injected medicine.  Irritation of the skin around the injection site. What happens before the procedure?  Ask your health care provider about changing or stopping your regular medicines. This is especially important if you are taking diabetes medicines or blood thinners. What happens during the procedure?  Your health care provider will feel for trigger points. A marker may be used to circle the area for the injection.  The skin over the trigger point will be washed with a germ-killing (antiseptic) solution.  A thin needle is used for the shot. You may feel pain or a twitching feeling when the needle enters the trigger point.  A numbing solution may be injected into the trigger point. Sometimes a medicine to keep down swelling, redness, and warmth (inflammation) is also injected.  Your health care provider may move the needle around the area where the trigger point is located until the tightness and  twitching goes away.  After the injection, your health care provider may put gentle pressure over the injection site.  The injection site will be covered with a bandage (dressing). The procedure may vary among health care providers and hospitals. What happens after the procedure?  The dressing can be taken off in a few hours or as told by your health care provider.  You may feel sore and stiff for 1-2 days. This information is not intended to replace advice given to you by your health care provider. Make sure you discuss any questions you have with your health care provider. Document Released: 11/14/2011 Document Revised: 07/28/2016 Document Reviewed: 05/15/2015 Elsevier Interactive Patient Education  2019 Elsevier Inc.   Prednisone tablets What is this medicine? PREDNISONE (PRED ni sone) is a corticosteroid. It is commonly used to treat inflammation of the skin, joints, lungs, and other organs. Common conditions treated include asthma, allergies, and arthritis. It is also used for other conditions, such as blood disorders and diseases of the adrenal glands. This medicine may be used for other purposes; ask your health care provider or pharmacist if you have questions. COMMON BRAND NAME(S): Deltasone, Predone, Sterapred, Sterapred DS What should I tell my health care provider before I take this medicine? They need to know if you have any of these conditions: -Cushing's syndrome -diabetes -glaucoma -heart disease -high blood pressure -infection (especially a virus infection such as chickenpox, cold sores, or herpes) -kidney disease -liver disease -mental illness -myasthenia gravis -osteoporosis -seizures -stomach or intestine problems -thyroid disease -an unusual or allergic reaction to lactose, prednisone,  other medicines, foods, dyes, or preservatives -pregnant or trying to get pregnant -breast-feeding How should I use this medicine? Take this medicine by mouth with a glass  of water. Follow the directions on the prescription label. Take this medicine with food. If you are taking this medicine once a day, take it in the morning. Do not take more medicine than you are told to take. Do not suddenly stop taking your medicine because you may develop a severe reaction. Your doctor will tell you how much medicine to take. If your doctor wants you to stop the medicine, the dose may be slowly lowered over time to avoid any side effects. Talk to your pediatrician regarding the use of this medicine in children. Special care may be needed. Overdosage: If you think you have taken too much of this medicine contact a poison control center or emergency room at once. NOTE: This medicine is only for you. Do not share this medicine with others. What if I miss a dose? If you miss a dose, take it as soon as you can. If it is almost time for your next dose, talk to your doctor or health care professional. You may need to miss a dose or take an extra dose. Do not take double or extra doses without advice. What may interact with this medicine? Do not take this medicine with any of the following medications: -metyrapone -mifepristone This medicine may also interact with the following medications: -aminoglutethimide -amphotericin B -aspirin and aspirin-like medicines -barbiturates -certain medicines for diabetes, like glipizide or glyburide -cholestyramine -cholinesterase inhibitors -cyclosporine -digoxin -diuretics -ephedrine -male hormones, like estrogens and birth control pills -isoniazid -ketoconazole -NSAIDS, medicines for pain and inflammation, like ibuprofen or naproxen -phenytoin -rifampin -toxoids -vaccines -warfarin This list may not describe all possible interactions. Give your health care provider a list of all the medicines, herbs, non-prescription drugs, or dietary supplements you use. Also tell them if you smoke, drink alcohol, or use illegal drugs. Some items may  interact with your medicine. What should I watch for while using this medicine? Visit your doctor or health care professional for regular checks on your progress. If you are taking this medicine over a prolonged period, carry an identification card with your name and address, the type and dose of your medicine, and your doctor's name and address. This medicine may increase your risk of getting an infection. Tell your doctor or health care professional if you are around anyone with measles or chickenpox, or if you develop sores or blisters that do not heal properly. If you are going to have surgery, tell your doctor or health care professional that you have taken this medicine within the last twelve months. Ask your doctor or health care professional about your diet. You may need to lower the amount of salt you eat. This medicine may increase blood sugar. Ask your healthcare provider if changes in diet or medicines are needed if you have diabetes. What side effects may I notice from receiving this medicine? Side effects that you should report to your doctor or health care professional as soon as possible: -allergic reactions like skin rash, itching or hives, swelling of the face, lips, or tongue -changes in emotions or moods -changes in vision -depressed mood -eye pain -fever or chills, cough, sore throat, pain or difficulty passing urine -signs and symptoms of high blood sugar such as being more thirsty or hungry or having to urinate more than normal. You may also feel very tired or have blurry  vision. -swelling of ankles, feet Side effects that usually do not require medical attention (report to your doctor or health care professional if they continue or are bothersome): -confusion, excitement, restlessness -headache -nausea, vomiting -skin problems, acne, thin and shiny skin -trouble sleeping -weight gain This list may not describe all possible side effects. Call your doctor for medical  advice about side effects. You may report side effects to FDA at 1-800-FDA-1088. Where should I keep my medicine? Keep out of the reach of children. Store at room temperature between 15 and 30 degrees C (59 and 86 degrees F). Protect from light. Keep container tightly closed. Throw away any unused medicine after the expiration date. NOTE: This sheet is a summary. It may not cover all possible information. If you have questions about this medicine, talk to your doctor, pharmacist, or health care provider.  2019 Elsevier/Gold Standard (2018-08-25 10:54:22)

## 2019-01-05 ENCOUNTER — Other Ambulatory Visit: Payer: Self-pay

## 2019-01-18 DIAGNOSIS — H2181 Floppy iris syndrome: Secondary | ICD-10-CM | POA: Diagnosis not present

## 2019-01-18 DIAGNOSIS — H25813 Combined forms of age-related cataract, bilateral: Secondary | ICD-10-CM | POA: Diagnosis not present

## 2019-01-18 DIAGNOSIS — H35033 Hypertensive retinopathy, bilateral: Secondary | ICD-10-CM | POA: Diagnosis not present

## 2019-01-18 NOTE — Progress Notes (Signed)
FOLLOW UP  Assessment and Plan:   Hypertension Well controlled with current medications  Monitor blood pressure at home; patient to call if consistently greater than 130/80 Continue DASH diet.   Reminder to go to the ER if any CP, SOB, nausea, dizziness, severe HA, changes vision/speech, left arm numbness and tingling and jaw pain.  Cholesterol Currently near LDL goal; continue statin Continue low cholesterol diet and exercise.  Check lipid panel.   Other abnormal glucose Elevated glucose and insulin levels without A1C abnormalities Continue diet and exercise.  Perform daily foot/skin check, notify office of any concerning changes.  Defer A1C; check bmp  Vitamin D Def Below goal at last visit; continue supplementation to maintain goal of 70-100 Defer Vit D level  Chronic lower back pain Managed by q3 month trigger point injections ? Coming from hips, MRI poorly explained symptoms Will refer to ortho   Bilateral hip pain S/p L hip replacement in 2015, symptoms started at that time, ? R/t implanted hardware Discussed referral to alternate ortho surgeon for second opinion and possibility for surgical correction, patient is in aggreement  Insomnia Uses ativan 3-4 days a week, mostly worse with pain - consider d/c'ing if pain management is improved Insomnia- good sleep hygiene discussed, increase day time activity, try melatonin or benadryl if this does not help we will call in sleep medication.   GERD Symptoms well managed without breakthrough Will try to get off PPI given info for taper and famotidine sent in   Continue diet and meds as discussed. Further disposition pending results of labs. Discussed med's effects and SE's.   Over 30 minutes of exam, counseling, chart review, and critical decision making was performed.   Future Appointments  Date Time Provider Department Center  04/22/2019 11:00 AM Lucky Cowboy, MD [facility] None  10/27/2019 10:00 AM Lucky Cowboy, MD [facility] None    ----------------------------------------------------------------------------------------------------------------------  HPI 68 y.o. male  presents for 3 month follow up on hypertension, cholesterol, glucose management, weight monitoring and vitamin D deficiency.   He haschronic back pain ongoing after hip replacement in 2015 and also falling off of a ladder in 2016; normal MRI, has been evaluated by Dr. Ophelia Charter and [name]. Epidural injections, dry needling, PT, lyrica/gabapentin have been ineffective in the past. He did have success with trigger point injections initiated by pain management, now getting by Marchelle Folks through our office, most recently on 12/16/2018 and also on nortryptyline 10 mg TID but patient has stopped this due to lack of perceived benefit. He currently takes tylenol to manage due to CKD.   he has a diagnosis of insomnia and is currently prescribed lorazepam 1 mg, takes 0.5 tab 3-4 days a week PRN, reports symptoms are well controlled on current regimen.   He is prescribed adderall by Dr. Evelene Croon, uses rarely since retirement.   he has a diagnosis of GERD which is currently managed by nexium 40 mg every other day.  he reports symptoms is currently well controlled, and denies breakthrough reflux, burning in chest, hoarseness or cough.     BMI is Body mass index is 25.22 kg/m., he has been working on diet and exercise. Wt Readings from Last 3 Encounters:  01/19/19 161 lb (73 kg)  12/16/18 158 lb (71.7 kg)  10/22/18 156 lb 3.2 oz (70.9 kg)   His blood pressure has been controlled at home, today their BP is BP: 126/80  He does workout. He denies chest pain, shortness of breath, dizziness.   He is on  cholesterol medication (atorvastatin 40 mg daily) and denies myalgias. His cholesterol is not at goal. The cholesterol last visit was:   Lab Results  Component Value Date   CHOL 191 10/09/2018   HDL 63 10/09/2018   LDLCALC 102 (H) 10/09/2018   TRIG 157  (H) 10/09/2018   CHOLHDL 3.0 10/09/2018    He has been working on diet and exercise for glucose management, and denies foot ulcerations, nausea, polydipsia, polyuria, visual disturbances, vomiting and weight loss. Last A1C in the office was:  Lab Results  Component Value Date   HGBA1C 5.0 10/09/2018   Patient is on Vitamin D supplement but remained below goal of 60 at the last visit:   Lab Results  Component Value Date   VD25OH 46 10/09/2018       Current Medications:  Current Outpatient Medications on File Prior to Visit  Medication Sig  . acetaminophen (TYLENOL) 500 MG tablet Take 500 mg by mouth every 6 (six) hours as needed.   Marland Kitchen amphetamine-dextroamphetamine (ADDERALL) 20 MG tablet   . atenolol (TENORMIN) 100 MG tablet TAKE 1 TABLET BY MOUTH DAILY  . atorvastatin (LIPITOR) 80 MG tablet Take 1 tablet daily for Cholesterol  . Cholecalciferol (VITAMIN D PO) Take 5,000 Units by mouth 2 (two) times daily.   Marland Kitchen esomeprazole (NEXIUM) 40 MG capsule TAKE ONE CAPSULE BY MOUTH DAILY AT NOON (Patient taking differently: every other day. )  . finasteride (PROSCAR) 5 MG tablet Take 1 tablet (5 mg total) by mouth daily.  Marland Kitchen LORazepam (ATIVAN) 1 MG tablet Take 1/2 to 1 tablet at Bedtime as needed for Sleep  . losartan-hydrochlorothiazide (HYZAAR) 100-25 MG tablet Take 1/2 to 1 tablet daily as directed for BP  . valACYclovir (VALTREX) 500 MG tablet TAKE 1 TABLET BY MOUTH THREE TIMES DAILY FOR FEVER BLISTERS (Patient taking differently: as needed. )  . nortriptyline (PAMELOR) 10 MG capsule Take 1 capsule (10 mg total) by mouth 3 (three) times daily as needed (pain). (Patient not taking: Reported on 01/19/2019)  . predniSONE (DELTASONE) 5 MG tablet Take 1 tablet 1 to 3 x/ day as directed (Patient not taking: Reported on 12/16/2018)   No current facility-administered medications on file prior to visit.      Allergies:  Allergies  Allergen Reactions  . Nsaids     Renal Insufficiency     Medical  History:  Past Medical History:  Diagnosis Date  . Arthritis    right thumb  . Dry eyes    Takes Restasis  . GERD (gastroesophageal reflux disease)   . Hyperlipidemia   . Hypertension   . Vitamin D deficiency    Family history- Reviewed and unchanged Social history- Reviewed and unchanged   Review of Systems:  Review of Systems  Constitutional: Negative for malaise/fatigue and weight loss.  HENT: Negative for hearing loss and tinnitus.   Eyes: Negative for blurred vision and double vision.  Respiratory: Negative for cough, shortness of breath and wheezing.   Cardiovascular: Negative for chest pain, palpitations, orthopnea, claudication and leg swelling.  Gastrointestinal: Negative for abdominal pain, blood in stool, constipation, diarrhea, heartburn, melena, nausea and vomiting.  Genitourinary: Negative.   Musculoskeletal: Positive for back pain and joint pain (bilateral hip pain, SI joint pain). Negative for myalgias.  Skin: Negative for rash.  Neurological: Negative for dizziness, tingling, sensory change, focal weakness, weakness and headaches.  Endo/Heme/Allergies: Negative for polydipsia.  Psychiatric/Behavioral: Negative.   All other systems reviewed and are negative.   Physical Exam: BP  126/80   Pulse 72   Temp (!) 97.5 F (36.4 C)   Ht 5\' 7"  (1.702 m)   Wt 161 lb (73 kg)   SpO2 99%   BMI 25.22 kg/m  Wt Readings from Last 3 Encounters:  01/19/19 161 lb (73 kg)  12/16/18 158 lb (71.7 kg)  10/22/18 156 lb 3.2 oz (70.9 kg)   General Appearance: Well nourished, in no apparent distress. Eyes: PERRLA, EOMs, conjunctiva no swelling or erythema Sinuses: No Frontal/maxillary tenderness ENT/Mouth: Ext aud canals clear, TMs without erythema, bulging. No erythema, swelling, or exudate on post pharynx.  Tonsils not swollen or erythematous. Hearing normal.  Neck: Supple, thyroid normal.  Respiratory: Respiratory effort normal, BS equal bilaterally without rales, rhonchi,  wheezing or stridor.  Cardio: RRR with no MRGs. Brisk peripheral pulses without edema.  Abdomen: Soft, + BS.  Non tender, no guarding, rebound, hernias, masses. Lymphatics: Non tender without lymphadenopathy.  Musculoskeletal: Full ROM, 5/5 strength, Normal gait, neg straight leg raise. He does have SI joint tenderness. He does have full ROM bilateral hips, no popping, creputus; patient reports brief pain during ROM of L hip, though not at extremes of ROM.  Skin: Warm, dry without rashes, lesions, ecchymosis.  Neuro: Cranial nerves intact. No cerebellar symptoms.  Psych: Awake and oriented X 3, normal affect, Insight and Judgment appropriate.    Dan MakerAshley C Jahir Halt, NP 11:19 AM [facility]

## 2019-01-19 ENCOUNTER — Encounter: Payer: Self-pay | Admitting: Adult Health

## 2019-01-19 ENCOUNTER — Ambulatory Visit (INDEPENDENT_AMBULATORY_CARE_PROVIDER_SITE_OTHER): Payer: PPO | Admitting: Adult Health

## 2019-01-19 VITALS — BP 126/80 | HR 72 | Temp 97.5°F | Ht 67.0 in | Wt 161.0 lb

## 2019-01-19 DIAGNOSIS — E785 Hyperlipidemia, unspecified: Secondary | ICD-10-CM | POA: Diagnosis not present

## 2019-01-19 DIAGNOSIS — N183 Chronic kidney disease, stage 3 unspecified: Secondary | ICD-10-CM

## 2019-01-19 DIAGNOSIS — Z6824 Body mass index (BMI) 24.0-24.9, adult: Secondary | ICD-10-CM

## 2019-01-19 DIAGNOSIS — E559 Vitamin D deficiency, unspecified: Secondary | ICD-10-CM | POA: Diagnosis not present

## 2019-01-19 DIAGNOSIS — F988 Other specified behavioral and emotional disorders with onset usually occurring in childhood and adolescence: Secondary | ICD-10-CM | POA: Diagnosis not present

## 2019-01-19 DIAGNOSIS — E8881 Metabolic syndrome: Secondary | ICD-10-CM | POA: Diagnosis not present

## 2019-01-19 DIAGNOSIS — Z79899 Other long term (current) drug therapy: Secondary | ICD-10-CM | POA: Diagnosis not present

## 2019-01-19 DIAGNOSIS — R7309 Other abnormal glucose: Secondary | ICD-10-CM | POA: Diagnosis not present

## 2019-01-19 DIAGNOSIS — E88819 Insulin resistance, unspecified: Secondary | ICD-10-CM

## 2019-01-19 DIAGNOSIS — K219 Gastro-esophageal reflux disease without esophagitis: Secondary | ICD-10-CM | POA: Diagnosis not present

## 2019-01-19 DIAGNOSIS — I10 Essential (primary) hypertension: Secondary | ICD-10-CM

## 2019-01-19 DIAGNOSIS — M25552 Pain in left hip: Secondary | ICD-10-CM

## 2019-01-19 DIAGNOSIS — M25551 Pain in right hip: Secondary | ICD-10-CM

## 2019-01-19 MED ORDER — FAMOTIDINE 20 MG PO TABS: 20.0000 mg | ORAL_TABLET | Freq: Two times a day (BID) | ORAL | 1 refills | Status: DC

## 2019-01-19 NOTE — Patient Instructions (Addendum)
Goals    . LDL CALC < 100      GETTING OFF OF PPI's    Nexium/protonix/prilosec/Omeprazole/Dexilant/Aciphex are called PPI's, they are great at healing your stomach but should only be taken for a short period of time.     Recent studies have shown that taken for a long time they  can increase the risk of osteoporosis (weakening of your bones), pneumonia, low magnesium, restless legs, Cdiff (infection that causes diarrhea), DEMENTIA and most recently kidney damage / disease / insufficiency.     Due to this information we want to try to stop the PPI but if you try to stop it abruptly this can cause rebound acid and worsening symptoms.   So this is how we want you to get off the PPI: Generic is always fine!!  - Start taking the nexium/protonix/prilosec/PPI  every other day with  zantac (ranitidine) OR pepcid famotadine 2 x a day for 2-4 weeks - some people stay on this dosage and can not taper off further. Our main goal is to limit the dosage and amount you are taking so if you need to stay on this dose.   - then decrease the PPI to every 3 days while taking the zantac or pepcid  twice a day the other  days for 2-4  Weeks  - then you can try the zantac or pepcid once at night or up to 2 x day as needed.  - you can continue on this once at night or stop all together  - Avoid alcohol, spicy foods, NSAIDS (aleve, ibuprofen) at this time. See foods below.   +++++++++++++++++++++++++++++++++++++++++++  Food Choices for Gastroesophageal Reflux Disease  When you have gastroesophageal reflux disease (GERD), the foods you eat and your eating habits are very important. Choosing the right foods can help ease the discomfort of GERD. WHAT GENERAL GUIDELINES DO I NEED TO FOLLOW?  Choose fruits, vegetables, whole grains, low-fat dairy products, and low-fat meat, fish, and poultry.  Limit fats such as oils, salad dressings, butter, nuts, and avocado.  Keep a food diary to identify foods that cause  symptoms.  Avoid foods that cause reflux. These may be different for different people.  Eat frequent small meals instead of three large meals each day.  Eat your meals slowly, in a relaxed setting.  Limit fried foods.  Cook foods using methods other than frying.  Avoid drinking alcohol.  Avoid drinking large amounts of liquids with your meals.  Avoid bending over or lying down until 2-3 hours after eating.   WHAT FOODS ARE NOT RECOMMENDED? The following are some foods and drinks that may worsen your symptoms:  Vegetables Tomatoes. Tomato juice. Tomato and spaghetti sauce. Chili peppers. Onion and garlic. Horseradish. Fruits Oranges, grapefruit, and lemon (fruit and juice). Meats High-fat meats, fish, and poultry. This includes hot dogs, ribs, ham, sausage, salami, and bacon. Dairy Whole milk and chocolate milk. Sour cream. Cream. Butter. Ice cream. Cream cheese.  Beverages Coffee and tea, with or without caffeine. Carbonated beverages or energy drinks. Condiments Hot sauce. Barbecue sauce.  Sweets/Desserts Chocolate and cocoa. Donuts. Peppermint and spearmint. Fats and Oils High-fat foods, including [place] fries and potato chips. Other Vinegar. Strong spices, such as black pepper, white pepper, red pepper, cayenne, curry powder, cloves, ginger, and chili powder.     Famotidine tablets or gelcaps What is this medicine? FAMOTIDINE (fa MOE ti deen) is a type of antihistamine that blocks the release of stomach acid. It is used to  treat stomach or intestinal ulcers. It can also relieve heartburn from acid reflux. This medicine may be used for other purposes; ask your health care provider or pharmacist if you have questions. COMMON BRAND NAME(S): Heartburn Relief, Pepcid, Pepcid AC, Pepcid AC Maximum Strength What should I tell my health care provider before I take this medicine? They need to know if you have any of these conditions: -kidney or liver disease -trouble  swallowing -an unusual or allergic reaction to famotidine, other medicines, foods, dyes, or preservatives -pregnant or trying to get pregnant -breast-feeding How should I use this medicine? Take this medicine by mouth with a glass of water. Follow the directions on the prescription label. If you only take this medicine once a day, take it at bedtime. Take your doses at regular intervals. Do not take your medicine more often than directed. Talk to your pediatrician regarding the use of this medicine in children. Special care may be needed. Overdosage: If you think you have taken too much of this medicine contact a poison control center or emergency room at once. NOTE: This medicine is only for you. Do not share this medicine with others. What if I miss a dose? If you miss a dose, take it as soon as you can. If it is almost time for your next dose, take only that dose. Do not take double or extra doses. What may interact with this medicine? -delavirdine -itraconazole -ketoconazole This list may not describe all possible interactions. Give your health care provider a list of all the medicines, herbs, non-prescription drugs, or dietary supplements you use. Also tell them if you smoke, drink alcohol, or use illegal drugs. Some items may interact with your medicine. What should I watch for while using this medicine? Tell your doctor or health care professional if your condition does not start to get better or if it gets worse. Finish the full course of tablets prescribed, even if you feel better. Do not take with aspirin, ibuprofen or other antiinflammatory medicines. These can make your condition worse. Do not smoke cigarettes or drink alcohol. These cause irritation in your stomach and can increase the time it will take for ulcers to heal. If you get black, tarry stools or vomit up what looks like coffee grounds, call your doctor or health care professional at once. You may have a bleeding  ulcer. This medicine may cause a decrease in vitamin B12. You should make sure that you get enough vitamin B12 while you are taking this medicine. Discuss the foods you eat and the vitamins you take with your health care professional. What side effects may I notice from receiving this medicine? Side effects that you should report to your doctor or health care professional as soon as possible: -agitation, nervousness -confusion -hallucinations -skin rash, itching Side effects that usually do not require medical attention (report to your doctor or health care professional if they continue or are bothersome): -constipation -diarrhea -dizziness -headache This list may not describe all possible side effects. Call your doctor for medical advice about side effects. You may report side effects to FDA at 1-800-FDA-1088. Where should I keep my medicine? Keep out of the reach of children. Store at room temperature between 15 and 30 degrees C (59 and 86 degrees F). Do not freeze. Throw away any unused medicine after the expiration date. NOTE: This sheet is a summary. It may not cover all possible information. If you have questions about this medicine, talk to your doctor, pharmacist, or health  care provider.  2019 Elsevier/Gold Standard (2017-07-11 13:17:50)

## 2019-01-20 LAB — COMPLETE METABOLIC PANEL WITH GFR
AG Ratio: 2 (calc) (ref 1.0–2.5)
ALT: 22 U/L (ref 9–46)
AST: 24 U/L (ref 10–35)
Albumin: 4.3 g/dL (ref 3.6–5.1)
Alkaline phosphatase (APISO): 49 U/L (ref 35–144)
BUN/Creatinine Ratio: 19 (calc) (ref 6–22)
BUN: 25 mg/dL (ref 7–25)
CO2: 29 mmol/L (ref 20–32)
Calcium: 9.5 mg/dL (ref 8.6–10.3)
Chloride: 103 mmol/L (ref 98–110)
Creat: 1.29 mg/dL — ABNORMAL HIGH (ref 0.70–1.25)
GFR, Est African American: 67 mL/min/{1.73_m2} (ref >=60)
GFR, Est Non African American: 57 mL/min/{1.73_m2} — ABNORMAL LOW (ref >=60)
Globulin: 2.1 g/dL (calc) (ref 1.9–3.7)
Glucose, Bld: 72 mg/dL (ref 65–99)
Potassium: 4 mmol/L (ref 3.5–5.3)
Sodium: 140 mmol/L (ref 135–146)
Total Bilirubin: 0.7 mg/dL (ref 0.2–1.2)
Total Protein: 6.4 g/dL (ref 6.1–8.1)

## 2019-01-20 LAB — LIPID PANEL
Cholesterol: 171 mg/dL (ref <200)
HDL: 48 mg/dL (ref >=40)
LDL Cholesterol (Calc): 85 mg/dL (calc)
Non-HDL Cholesterol (Calc): 123 mg/dL (calc) (ref <130)
Total CHOL/HDL Ratio: 3.6 (calc) (ref <5.0)
Triglycerides: 317 mg/dL — ABNORMAL HIGH (ref <150)

## 2019-01-20 LAB — CBC WITH DIFFERENTIAL/PLATELET
Absolute Monocytes: 1053 cells/uL — ABNORMAL HIGH (ref 200–950)
Basophils Absolute: 54 cells/uL (ref 0–200)
Basophils Relative: 0.6 %
Eosinophils Absolute: 108 cells/uL (ref 15–500)
Eosinophils Relative: 1.2 %
HCT: 43 % (ref 38.5–50.0)
Hemoglobin: 15 g/dL (ref 13.2–17.1)
Lymphs Abs: 1350 cells/uL (ref 850–3900)
MCH: 35 pg — ABNORMAL HIGH (ref 27.0–33.0)
MCHC: 34.9 g/dL (ref 32.0–36.0)
MCV: 100.2 fL — ABNORMAL HIGH (ref 80.0–100.0)
MPV: 10.2 fL (ref 7.5–12.5)
Monocytes Relative: 11.7 %
Neutro Abs: 6435 cells/uL (ref 1500–7800)
Neutrophils Relative %: 71.5 %
Platelets: 268 10*3/uL (ref 140–400)
RBC: 4.29 10*6/uL (ref 4.20–5.80)
RDW: 11.7 % (ref 11.0–15.0)
Total Lymphocyte: 15 %
WBC: 9 10*3/uL (ref 3.8–10.8)

## 2019-01-20 LAB — MAGNESIUM: Magnesium: 1.9 mg/dL (ref 1.5–2.5)

## 2019-01-20 LAB — TSH: TSH: 0.85 mIU/L (ref 0.40–4.50)

## 2019-02-01 DIAGNOSIS — H2513 Age-related nuclear cataract, bilateral: Secondary | ICD-10-CM | POA: Diagnosis not present

## 2019-02-01 DIAGNOSIS — H2511 Age-related nuclear cataract, right eye: Secondary | ICD-10-CM | POA: Diagnosis not present

## 2019-02-16 DIAGNOSIS — H25811 Combined forms of age-related cataract, right eye: Secondary | ICD-10-CM | POA: Diagnosis not present

## 2019-02-16 DIAGNOSIS — H2181 Floppy iris syndrome: Secondary | ICD-10-CM | POA: Diagnosis not present

## 2019-02-16 DIAGNOSIS — H268 Other specified cataract: Secondary | ICD-10-CM | POA: Diagnosis not present

## 2019-02-16 DIAGNOSIS — H2511 Age-related nuclear cataract, right eye: Secondary | ICD-10-CM | POA: Diagnosis not present

## 2019-02-22 DIAGNOSIS — H2512 Age-related nuclear cataract, left eye: Secondary | ICD-10-CM | POA: Diagnosis not present

## 2019-02-24 DIAGNOSIS — M5416 Radiculopathy, lumbar region: Secondary | ICD-10-CM | POA: Diagnosis not present

## 2019-02-24 DIAGNOSIS — M25551 Pain in right hip: Secondary | ICD-10-CM | POA: Diagnosis not present

## 2019-03-09 DIAGNOSIS — M5137 Other intervertebral disc degeneration, lumbosacral region: Secondary | ICD-10-CM | POA: Diagnosis not present

## 2019-03-09 DIAGNOSIS — M545 Low back pain: Secondary | ICD-10-CM | POA: Diagnosis not present

## 2019-03-09 DIAGNOSIS — M25551 Pain in right hip: Secondary | ICD-10-CM | POA: Diagnosis not present

## 2019-03-09 DIAGNOSIS — M25552 Pain in left hip: Secondary | ICD-10-CM | POA: Diagnosis not present

## 2019-03-09 DIAGNOSIS — Z96642 Presence of left artificial hip joint: Secondary | ICD-10-CM | POA: Diagnosis not present

## 2019-03-16 ENCOUNTER — Other Ambulatory Visit: Payer: Self-pay | Admitting: Internal Medicine

## 2019-03-16 DIAGNOSIS — I10 Essential (primary) hypertension: Secondary | ICD-10-CM

## 2019-03-31 NOTE — Progress Notes (Addendum)
MEDICARE ANNUAL WELLNESS VISIT AND FOLLOW UP THIS ENCOUNTER IS A VIRTUAL/TELEVIDEO  VISIT DUE TO COVID-19 - PATIENT WAS NOT SEEN IN THE OFFICE.  PATIENT HAS CONSENTED TO VIRTUAL VISIT / TELEVIDEO VISIT  This provider placed a call to [name] [name] , his appointment was changed to a virtual office visit to reduce the risk of exposure to the COVID-19 virus and to help [employer] Corporation[employer] Z Stump remain healthy and safe. The virtual visit will also provide continuity of care. He verbalizes understanding.   Assessment:   Encounter for Medicare annual wellness exam 1 year  CKD (chronic kidney disease) stage 3, GFR 30-59 ml/min (HCC) Will cut back on HCTZ due to hypotension/dizziness Take losartan 100 and hctz 12.5mg  daily- if still low will do HCTZ every other day -     losartan (COZAAR) 100 MG tablet; Take 1 tablet (100 mg total) by mouth daily. -     hydrochlorothiazide (HYDRODIURIL) 12.5 MG tablet; Take 1 tablet (12.5 mg total) by mouth daily. Has follow up 1 month  Essential hypertension -     losartan (COZAAR) 100 MG tablet; Take 1 tablet (100 mg total) by mouth daily. -     hydrochlorothiazide (HYDRODIURIL) 12.5 MG tablet; Take 1 tablet (12.5 mg total) by mouth daily. Will cut back on HCTZ due to hypotension/dizziness Take losartan 100 and hctz 12.5mg  daily- if still low will do HCTZ every other day  Blood glucose abnormal Discussed disease progression and risks Discussed diet/exercise, weight management and risk modification  Hyperlipidemia, unspecified hyperlipidemia type check lipids decrease fatty foods increase activity.   Medication management  Vitamin D deficiency Continue supplement  Insomnia, unspecified type Continue meds Will need to cut back on ativan or stop if sending to pain management  Idiopathic gout, unspecified chronicity, unspecified site Gout- recheck Uric acid as needed, Diet discussed, continue medications.  DDD (degenerative disc disease),  lumbosacral -     Ambulatory referral to Orthopedics Has seen two neurosurgeons and 2 orthopedics without being able to find a source and th pain is affecting quality of life Will refer to pain management, requesting Dr. Venia CarbonPhilips, will consider another refer to tertiary hospital  Gastroesophageal reflux disease, esophagitis presence not specified Continue PPI/H2 blocker, diet discussed  Attention deficit disorder, unspecified hyperactivity presence Continue med from Dr. Sharl MaKerr  Over 30 minutes of exam, counseling, chart review, and critical decision making was performed  Future Appointments  Date Time Provider Department Center  04/22/2019 11:00 AM Lucky CowboyMcKeown, William, MD [facility] None  10/27/2019 10:00 AM Lucky CowboyMcKeown, William, MD [facility] None    Plan:   During the course of the visit the patient was educated and counseled about appropriate screening and preventive services including:    Pneumococcal vaccine   Influenza vaccine  Prevnar 13  Td vaccine  Screening electrocardiogram  Colorectal cancer screening  Diabetes screening  Glaucoma screening  Nutrition counseling    Subjective:  [name] GuyRandell Z [name] is a 68 y.o. male who presents for Medicare Annual Wellness Visit and 3 month follow up for HTN, hyperlipidemia, prediabetes, and vitamin D Def.   His blood pressure has been controlled at home (atenolol 100 and Hyzaar 100/25), STATES THAT HE WAS HAVING DIZZINESS AND LOW BP SO HE CUT HIS BP IN HALF- today their BP is BP: 135/75 He does not workout due to his back pain. He denies chest pain, shortness of breath, dizziness.  BMI is Body mass index is 24.43 kg/m., he is working on diet and exercise. Wt Readings from  Last 3 Encounters:  04/01/19 156 lb (70.8 kg)  01/19/19 161 lb (73 kg)  12/16/18 158 lb (71.7 kg)   He has history of chronic back pain, had referral to ortho, saw Okey Regal PA with Dr. Ethelene Hal. He has seen Dr. Berton Lan for the hip pain. He got an ESI L5/L5 which  made it worse on March 31st. States he continues to have pain, worse with cutting grass/walking, standing, best with sitting or lying. STAIRS DO NOT BOTHER HIM, BETTER WITH WALKING IF HE IS SUPPORTING HIMSELF States the pain is affecting his quality of life, unable to golf, hard to get groceries, etc. Not affecting ADL's. He continues to have pain right SI to his hip down his leg.   Epidural injections, dry needling, PT, lyrica/gabapentin have been ineffective in the past. He did have success with trigger point injections until recently and it has gotten progressively worse.   He is on ativan for sleep as needed, he is on adderall.  He is on cholesterol medication (lipitor 80mg ) and denies myalgias. His cholesterol is at goal. The cholesterol last visit was:   Lab Results  Component Value Date   CHOL 171 01/19/2019   HDL 48 01/19/2019   LDLCALC 85 01/19/2019   TRIG 317 (H) 01/19/2019   CHOLHDL 3.6 01/19/2019   He has been working on diet and exercise for prediabetes and insulin resistance, and denies paresthesia of the feet, polydipsia, polyuria and visual disturbances. Last A1C in the office was:  Lab Results  Component Value Date   HGBA1C 5.0 10/09/2018   Last GFR Lab Results  Component Value Date   GFRNONAA 57 (L) 01/19/2019    Patient is on Vitamin D supplement.   Lab Results  Component Value Date   VD25OH 46 10/09/2018      Medication Review:   Current Outpatient Medications (Cardiovascular):  .  atenolol (TENORMIN) 100 MG tablet, TAKE 1 TABLET BY MOUTH DAILY .  atorvastatin (LIPITOR) 80 MG tablet, Take 1 tablet daily for Cholesterol .  hydrochlorothiazide (HYDRODIURIL) 12.5 MG tablet, Take 1 tablet (12.5 mg total) by mouth daily. Marland Kitchen  losartan (COZAAR) 100 MG tablet, Take 1 tablet (100 mg total) by mouth daily. Marland Kitchen  losartan-hydrochlorothiazide (HYZAAR) 100-25 MG tablet, Take 1/2 to 1 tablet daily as directed for BP   Current Outpatient Medications (Analgesics):  .   acetaminophen (TYLENOL) 500 MG tablet, Take 500 mg by mouth every 6 (six) hours as needed.    Current Outpatient Medications (Other):  .  amphetamine-dextroamphetamine (ADDERALL) 20 MG tablet,  .  Cholecalciferol (VITAMIN D PO), Take 5,000 Units by mouth 2 (two) times daily.  Marland Kitchen  esomeprazole (NEXIUM) 40 MG capsule, TAKE ONE CAPSULE BY MOUTH DAILY AT NOON (Patient taking differently: every other day. ) .  finasteride (PROSCAR) 5 MG tablet, Take 1 tablet (5 mg total) by mouth daily. Marland Kitchen  LORazepam (ATIVAN) 1 MG tablet, Take 1/2 to 1 tablet at Bedtime as needed for Sleep .  nortriptyline (PAMELOR) 10 MG capsule, Take 1 capsule (10 mg total) by mouth 3 (three) times daily as needed (pain). .  valACYclovir (VALTREX) 500 MG tablet, TAKE 1 TABLET BY MOUTH THREE TIMES DAILY FOR FEVER BLISTERS (Patient taking differently: as needed. )  Allergies: Allergies  Allergen Reactions  . Nsaids     Renal Insufficiency    Current Problems (verified) has Hypertension; Hyperlipidemia; GERD (gastroesophageal reflux disease); Vitamin D deficiency; Blood glucose abnormal; Medication management; Avascular necrosis of left femoral head (HCC); Insomnia; Gout; DDD (  degenerative disc disease), lumbosacral; CKD (chronic kidney disease) stage 3, GFR 30-59 ml/min (HCC); and ADD (attention deficit disorder) on their problem list.  Screening Tests Immunization History  Administered Date(s) Administered  . Influenza Split 09/22/2013  . Influenza-Unspecified 09/08/2017, 08/19/2018  . PPD Test 09/13/2014, 07/25/2015, 08/08/2016, 09/08/2017, 10/09/2018  . Pneumococcal Conjugate-13 01/22/2018  . Pneumococcal Polysaccharide-23 01/19/2009  . Tdap 01/19/2009  . Zoster 01/29/2013    Preventative care: Last colonoscopy: 07/2018  Prior vaccinations: TD or Tdap: 2010 DUE  Influenza: 2019 Pneumococcal: 2010 Prevnar13: 2019 Shingles/Zostavax: DUE  Names of Other Physician/Practitioners you currently use: 1. [facility] here for primary care 2. Dr. Sherrine Maples, eye doctor, last visit 2020 3. [place], dentist, last visit Jan 2020 Patient Care Team: Lucky Cowboy, MD as PCP - General (Internal Medicine)  Surgical: He  has a past surgical history that includes Esophagogastroduodenoscopy (1990); Colonoscopy w/ polypectomy; and Total hip arthroplasty (Left, 10/28/2014). Family His family history includes COPD in his father; Diabetes in his brother; Heart disease in his father; Osteoporosis in his mother; Schizophrenia in his mother. Social history  He reports that he has never smoked. He has never used smokeless tobacco. He reports current alcohol use. He reports that he does not use drugs.  MEDICARE WELLNESS OBJECTIVES: Physical activity: Current Exercise Habits: The patient does not participate in regular exercise at present, Exercise limited by: orthopedic condition(s) Cardiac risk factors: Cardiac Risk Factors include: advanced age (>46men, >79 women);dyslipidemia;hypertension;male gender;sedentary lifestyle Depression/mood screen:   Depression screen [facility] 2/9 04/01/2019  Decreased Interest 0  Down, Depressed, Hopeless 0  PHQ - 2 Score 0  Altered sleeping -  Tired, decreased energy -  Change in appetite -  Feeling bad or failure about yourself  -  Trouble concentrating -  Moving slowly or fidgety/restless -  Suicidal thoughts -  PHQ-9 Score -  Difficult doing work/chores -    ADLs:  In your present state of health, do you have any difficulty performing the following activities: 04/01/2019 10/09/2018  Hearing? N N  Vision? N N  Difficulty concentrating or making decisions? N N  Walking or climbing stairs? N N  Dressing or bathing? N N  Doing errands, shopping? N N  Some recent data might be hidden     Cognitive Testing  Alert? Yes  Normal Appearance?Yes  Oriented to person? Yes  Place? Yes   Time? Yes  Recall of three objects?  Yes  Can perform simple  calculations? Yes  Displays appropriate judgment?Yes  Can read the correct time from a watch face?Yes  EOL planning: Does Patient Have a Medical Advance Directive?: No Would patient like information on creating a medical advance directive?: Yes (MAU/Ambulatory/Procedural Areas - Information given)   Objective:   Today's Vitals   04/01/19 1050  BP: 135/75  Weight: 156 lb (70.8 kg)  Height:  (1.702 m)   Body mass index is 24.43 kg/m.  General Appearance:Well sounding, in no apparent distress.  ENT/Mouth: No hoarseness, No cough for duration of visit.  Respiratory: completing full sentences without distress, without audible wheeze Neuro: Awake and oriented X 3,  Psych:  Insight and Judgment appropriate.    Medicare Attestation I have personally reviewed: The patient's medical and social history Their use of alcohol, tobacco or illicit drugs Their current medications and supplements The patient's functional ability including ADLs,fall risks, home safety risks, cognitive, and hearing and visual impairment Diet and physical activities Evidence for depression or mood disorders  The  patient's weight, height, BMI, and visual acuity have been recorded in the chart.  I have made referrals, counseling, and provided education to the patient based on review of the above and I have provided the patient with a written personalized care plan for preventive services.     Quentin Mulling, PA-C   04/01/2019

## 2019-04-01 ENCOUNTER — Ambulatory Visit: Payer: PPO | Admitting: Physician Assistant

## 2019-04-01 ENCOUNTER — Other Ambulatory Visit: Payer: Self-pay

## 2019-04-01 ENCOUNTER — Encounter: Payer: Self-pay | Admitting: Physician Assistant

## 2019-04-01 VITALS — BP 135/75 | Ht 67.0 in | Wt 156.0 lb

## 2019-04-01 DIAGNOSIS — M5137 Other intervertebral disc degeneration, lumbosacral region: Secondary | ICD-10-CM

## 2019-04-01 DIAGNOSIS — F988 Other specified behavioral and emotional disorders with onset usually occurring in childhood and adolescence: Secondary | ICD-10-CM

## 2019-04-01 DIAGNOSIS — Z79899 Other long term (current) drug therapy: Secondary | ICD-10-CM | POA: Diagnosis not present

## 2019-04-01 DIAGNOSIS — E785 Hyperlipidemia, unspecified: Secondary | ICD-10-CM | POA: Diagnosis not present

## 2019-04-01 DIAGNOSIS — M87052 Idiopathic aseptic necrosis of left femur: Secondary | ICD-10-CM

## 2019-04-01 DIAGNOSIS — R6889 Other general symptoms and signs: Secondary | ICD-10-CM

## 2019-04-01 DIAGNOSIS — E559 Vitamin D deficiency, unspecified: Secondary | ICD-10-CM

## 2019-04-01 DIAGNOSIS — K219 Gastro-esophageal reflux disease without esophagitis: Secondary | ICD-10-CM

## 2019-04-01 DIAGNOSIS — M1000 Idiopathic gout, unspecified site: Secondary | ICD-10-CM

## 2019-04-01 DIAGNOSIS — N183 Chronic kidney disease, stage 3 unspecified: Secondary | ICD-10-CM

## 2019-04-01 DIAGNOSIS — G4700 Insomnia, unspecified: Secondary | ICD-10-CM | POA: Diagnosis not present

## 2019-04-01 DIAGNOSIS — Z0001 Encounter for general adult medical examination with abnormal findings: Secondary | ICD-10-CM

## 2019-04-01 DIAGNOSIS — I10 Essential (primary) hypertension: Secondary | ICD-10-CM | POA: Diagnosis not present

## 2019-04-01 DIAGNOSIS — R7309 Other abnormal glucose: Secondary | ICD-10-CM | POA: Diagnosis not present

## 2019-04-01 DIAGNOSIS — Z0000 Encounter for general adult medical examination without abnormal findings: Secondary | ICD-10-CM

## 2019-04-01 MED ORDER — HYDROCHLOROTHIAZIDE 12.5 MG PO TABS: 12.5000 mg | ORAL_TABLET | Freq: Every day | ORAL | 3 refills | Status: DC

## 2019-04-01 MED ORDER — LOSARTAN POTASSIUM 100 MG PO TABS: 100.0000 mg | ORAL_TABLET | Freq: Every day | ORAL | 1 refills | Status: DC

## 2019-04-16 ENCOUNTER — Other Ambulatory Visit: Payer: Self-pay | Admitting: Adult Health

## 2019-04-16 DIAGNOSIS — I10 Essential (primary) hypertension: Secondary | ICD-10-CM

## 2019-04-16 MED ORDER — LOSARTAN POTASSIUM-HCTZ 100-25 MG PO TABS: ORAL_TABLET | ORAL | 1 refills | Status: DC

## 2019-04-22 ENCOUNTER — Other Ambulatory Visit: Payer: Self-pay

## 2019-04-22 ENCOUNTER — Encounter: Payer: Self-pay | Admitting: Internal Medicine

## 2019-04-22 ENCOUNTER — Ambulatory Visit (INDEPENDENT_AMBULATORY_CARE_PROVIDER_SITE_OTHER): Payer: PPO | Admitting: Internal Medicine

## 2019-04-22 VITALS — BP 112/70 | HR 76 | Temp 97.0°F | Resp 16 | Ht 67.0 in | Wt 160.4 lb

## 2019-04-22 DIAGNOSIS — G8929 Other chronic pain: Secondary | ICD-10-CM

## 2019-04-22 DIAGNOSIS — M1000 Idiopathic gout, unspecified site: Secondary | ICD-10-CM

## 2019-04-22 DIAGNOSIS — I10 Essential (primary) hypertension: Secondary | ICD-10-CM

## 2019-04-22 DIAGNOSIS — M5441 Lumbago with sciatica, right side: Secondary | ICD-10-CM | POA: Diagnosis not present

## 2019-04-22 DIAGNOSIS — K219 Gastro-esophageal reflux disease without esophagitis: Secondary | ICD-10-CM

## 2019-04-22 DIAGNOSIS — E782 Mixed hyperlipidemia: Secondary | ICD-10-CM | POA: Diagnosis not present

## 2019-04-22 DIAGNOSIS — R7309 Other abnormal glucose: Secondary | ICD-10-CM

## 2019-04-22 DIAGNOSIS — E559 Vitamin D deficiency, unspecified: Secondary | ICD-10-CM | POA: Diagnosis not present

## 2019-04-22 DIAGNOSIS — R7303 Prediabetes: Secondary | ICD-10-CM

## 2019-04-22 DIAGNOSIS — Z79899 Other long term (current) drug therapy: Secondary | ICD-10-CM

## 2019-04-22 MED ORDER — PREGABALIN 25 MG PO CAPS: ORAL_CAPSULE | ORAL | 0 refills | Status: DC

## 2019-04-22 NOTE — Progress Notes (Signed)
History of Present Illness:      This very nice 68 y.o. DWM  presents for 6 month follow up with HTN, HLD, Pre-Diabetes and Vitamin D Deficiency. Patient also has GERD controlled on his meds.       Patient is treated for HTN (1988)  & BP has been controlled at home. Today's BP is at goal - 112/70. Patient has had no complaints of any cardiac type chest pain, palpitations, dyspnea / orthopnea / PND, dizziness, claudication, or dependent edema.      Hyperlipidemia is controlled with diet & Atorvastatin. Patient denies myalgias or other med SE's. Last Lipids were at goal except elevated Trig's: Lab Results  Component Value Date   CHOL 171 01/19/2019   HDL 48 01/19/2019   LDLCALC 85 01/19/2019   TRIG 317 (H) 01/19/2019   CHOLHDL 3.6 01/19/2019       Also, the patient has history of  Prediabetes and Insulin Resistance (Insulin 28.6 / Aug 2016 & 33.7 / June 2018) and he has had no symptoms of reactive hypoglycemia, diabetic polys, paresthesias or visual blurring.  Last A1c was at goal: Lab Results  Component Value Date   HGBA1C 5.0 10/09/2018      Further, the patient also has history of Vitamin D Deficiency("28" / 2008)  and supplements vitamin D without any suspected side-effects. Last vitamin D was low (goal 70-100): Lab Results  Component Value Date   VD25OH 46 10/09/2018   Current Outpatient Medications on File Prior to Visit  Medication Sig  . acetaminophen (TYLENOL) 500 MG tablet Take 500 mg by mouth every 6 (six) hours as needed.   Marland Kitchen amphetamine-dextroamphetamine (ADDERALL) 20 MG tablet   . atenolol (TENORMIN) 100 MG tablet TAKE 1 TABLET BY MOUTH DAILY  . atorvastatin (LIPITOR) 80 MG tablet Take 1 tablet daily for Cholesterol  . Cholecalciferol (VITAMIN D PO) Take 10,000 Units by mouth daily.   Marland Kitchen esomeprazole (NEXIUM) 40 MG capsule TAKE ONE CAPSULE BY MOUTH DAILY AT NOON (Patient taking differently: every other day. )  . finasteride (PROSCAR) 5 MG tablet Take 1 tablet (5 mg  total) by mouth daily.  Marland Kitchen LORazepam (ATIVAN) 1 MG tablet Take 1/2 to 1 tablet at Bedtime as needed for Sleep  . losartan-hydrochlorothiazide (HYZAAR) 100-25 MG tablet Take 1/2 to 1 tablet daily as directed for BP  . valACYclovir (VALTREX) 500 MG tablet TAKE 1 TABLET BY MOUTH THREE TIMES DAILY FOR FEVER BLISTERS (Patient taking differently: as needed. )   No current facility-administered medications on file prior to visit.    Allergies  Allergen Reactions  . Nsaids     Renal Insufficiency   PMHx:   Past Medical History:  Diagnosis Date  . Arthritis    right thumb  . Dry eyes    Takes Restasis  . GERD (gastroesophageal reflux disease)   . Hyperlipidemia   . Hypertension   . Vitamin D deficiency    Immunization History  Administered Date(s) Administered  . Influenza Split 09/22/2013  . Influenza-Unspecified 09/08/2017, 08/19/2018  . PPD Test 09/13/2014, 07/25/2015, 08/08/2016, 09/08/2017, 10/09/2018  . Pneumococcal Conjugate-13 01/22/2018  . Pneumococcal Polysaccharide-23 01/19/2009  . Tdap 01/19/2009  . Zoster 01/29/2013   Past Surgical History:  Procedure Laterality Date  . COLONOSCOPY W/ POLYPECTOMY    . ESOPHAGOGASTRODUODENOSCOPY  1990  . TOTAL HIP ARTHROPLASTY Left 10/28/2014   Procedure: TOTAL HIP ARTHROPLASTY ANTERIOR APPROACH;  Surgeon: Eldred Manges, MD;  Location: [facility] OR;  Service: Orthopedics;  Laterality: Left;  FHx:    Reviewed / unchanged  SHx:    Reviewed / unchanged   Systems Review:  Constitutional: Denies fever, chills, wt changes, headaches, insomnia, fatigue, night sweats, change in appetite. Eyes: Denies redness, blurred vision, diplopia, discharge, itchy, watery eyes.  ENT: Denies discharge, congestion, post nasal drip, epistaxis, sore throat, earache, hearing loss, dental pain, tinnitus, vertigo, sinus pain, snoring.  CV: Denies chest pain, palpitations, irregular heartbeat, syncope, dyspnea, diaphoresis, orthopnea, PND, claudication or edema.  Respiratory: denies cough, dyspnea, DOE, pleurisy, hoarseness, laryngitis, wheezing.  Gastrointestinal: Denies dysphagia, odynophagia, heartburn, reflux, water brash, abdominal pain or cramps, nausea, vomiting, bloating, diarrhea, constipation, hematemesis, melena, hematochezia  or hemorrhoids. Genitourinary: Denies dysuria, frequency, urgency, nocturia, hesitancy, discharge, hematuria or flank pain. Musculoskeletal: Denies arthralgias, myalgias, stiffness, jt. swelling, pain, limping or strain/sprain.  Skin: Denies pruritus, rash, hives, warts, acne, eczema or change in skin lesion(s). Neuro: No weakness, tremor, incoordination, spasms, paresthesia or pain. Psychiatric: Denies confusion, memory loss or sensory loss. Endo: Denies change in weight, skin or hair change.  Heme/Lymph: No excessive bleeding, bruising or enlarged lymph nodes.  Physical Exam  BP 112/70   Pulse 76   Temp (!) 97 F (36.1 C)   Resp 16   Ht 5\' 7"  (1.702 m)   Wt 160 lb 6.4 oz (72.8 kg)   BMI 25.12 kg/m   Appears  well nourished, well groomed  and in no distress.  Eyes: PERRLA, EOMs, conjunctiva no swelling or erythema. Sinuses: No frontal/maxillary tenderness ENT/Mouth: EAC's clear, TM's nl w/o erythema, bulging. Nares clear w/o erythema, swelling, exudates. Oropharynx clear without erythema or exudates. Oral hygiene is good. Tongue normal, non obstructing. Hearing intact.  Neck: Supple. Thyroid not palpable. Car 2+/2+ without bruits, nodes or JVD. Chest: Respirations nl with BS clear & equal w/o rales, rhonchi, wheezing or stridor.  Cor: Heart sounds normal w/ regular rate and rhythm without sig. murmurs, gallops, clicks or rubs. Peripheral pulses normal and equal  without edema.  Abdomen: Soft & bowel sounds normal. Non-tender w/o guarding, rebound, hernias, masses or organomegaly.  Lymphatics: Unremarkable.  Musculoskeletal: Full ROM all peripheral extremities, joint stability, 5/5 strength and normal gait.   Skin: Warm, dry without exposed rashes, lesions or ecchymosis apparent.  Neuro: Cranial nerves intact, reflexes equal bilaterally. Sensory-motor testing grossly intact. Tendon reflexes grossly intact.  Pysch: Alert & oriented x 3.  Insight and judgement nl & appropriate. No ideations.  Assessment and Plan:  1. Essential hypertension  - Continue medication, monitor blood pressure at home.  - Continue DASH diet.  Reminder to go to the ER if any CP,  SOB, nausea, dizziness, severe HA, changes vision/speech.  - CBC with Differential/Platelet - COMPLETE METABOLIC PANEL WITH GFR - Magnesium - TSH  2. Hyperlipidemia, mixed  - Continue diet/meds, exercise,& lifestyle modifications.  - Continue monitor periodic cholesterol/liver & renal functions   - Lipid panel - TSH  3. Abnormal glucose  - Continue diet, exercise  - Lifestyle modifications.  - Monitor appropriate labs.  - Hemoglobin A1c - Insulin, random  4. Vitamin D deficiency  - Continue supplementation.  - VITAMIN D 25 Hydroxyl  5. Prediabetes  - Hemoglobin A1c - Insulin, random  6. Idiopathic gout, hx  - Uric acid  7. Gastroesophageal reflux disease  - CBC with Differential/Platelet  8. Chronic right-sided low back pain with right-sided sciatica  - pregabalin (LYRICA) 25 MG capsule; Take 1 capsule 3 x /day for Pain  Dispense: 90 capsule; Refill: 0  9. Medication  management  - CBC with Differential/Platelet - COMPLETE METABOLIC PANEL WITH GFR - Magnesium - Lipid panel - TSH - Hemoglobin A1c - Insulin, random - VITAMIN D 25 Hydroxyl - Uric acid      Discussed  regular exercise, BP monitoring, weight control to achieve/maintain BMI less than 25 and discussed med and SE's. Recommended labs to assess and monitor clinical status with further disposition pending results of labs. I discussed the assessment and treatment plan with the patient. The patient was provided an opportunity to ask questions and all  were answered. The patient agreed with the plan and demonstrated an understanding of the instructions. I provided  over 25 minutes of exam, counseling, chart review and  complex critical decision making was performed   Marinus Maw, MD

## 2019-04-22 NOTE — Patient Instructions (Signed)

## 2019-04-23 LAB — CBC WITH DIFFERENTIAL/PLATELET
Absolute Monocytes: 781 cells/uL (ref 200–950)
Basophils Absolute: 68 cells/uL (ref 0–200)
Basophils Relative: 1.1 %
Eosinophils Absolute: 62 cells/uL (ref 15–500)
Eosinophils Relative: 1 %
HCT: 40.4 % (ref 38.5–50.0)
Hemoglobin: 14.1 g/dL (ref 13.2–17.1)
Lymphs Abs: 1395 cells/uL (ref 850–3900)
MCH: 34.9 pg — ABNORMAL HIGH (ref 27.0–33.0)
MCHC: 34.9 g/dL (ref 32.0–36.0)
MCV: 100 fL (ref 80.0–100.0)
MPV: 10.3 fL (ref 7.5–12.5)
Monocytes Relative: 12.6 %
Neutro Abs: 3894 cells/uL (ref 1500–7800)
Neutrophils Relative %: 62.8 %
Platelets: 257 10*3/uL (ref 140–400)
RBC: 4.04 10*6/uL — ABNORMAL LOW (ref 4.20–5.80)
RDW: 12.2 % (ref 11.0–15.0)
Total Lymphocyte: 22.5 %
WBC: 6.2 10*3/uL (ref 3.8–10.8)

## 2019-04-23 LAB — COMPLETE METABOLIC PANEL WITH GFR
AG Ratio: 2 (calc) (ref 1.0–2.5)
ALT: 20 U/L (ref 9–46)
AST: 28 U/L (ref 10–35)
Albumin: 4.3 g/dL (ref 3.6–5.1)
Alkaline phosphatase (APISO): 51 U/L (ref 35–144)
BUN/Creatinine Ratio: 22 (calc) (ref 6–22)
BUN: 30 mg/dL — ABNORMAL HIGH (ref 7–25)
CO2: 30 mmol/L (ref 20–32)
Calcium: 9.7 mg/dL (ref 8.6–10.3)
Chloride: 103 mmol/L (ref 98–110)
Creat: 1.37 mg/dL — ABNORMAL HIGH (ref 0.70–1.25)
GFR, Est African American: 62 mL/min/{1.73_m2} (ref >=60)
GFR, Est Non African American: 53 mL/min/{1.73_m2} — ABNORMAL LOW (ref >=60)
Globulin: 2.1 g/dL (calc) (ref 1.9–3.7)
Glucose, Bld: 98 mg/dL (ref 65–99)
Potassium: 4.1 mmol/L (ref 3.5–5.3)
Sodium: 140 mmol/L (ref 135–146)
Total Bilirubin: 0.8 mg/dL (ref 0.2–1.2)
Total Protein: 6.4 g/dL (ref 6.1–8.1)

## 2019-04-23 LAB — INSULIN, RANDOM: Insulin: 15.1 u[IU]/mL

## 2019-04-23 LAB — HEMOGLOBIN A1C
Hgb A1c MFr Bld: 4.9 % of total Hgb (ref <5.7)
Mean Plasma Glucose: 94 (calc)
eAG (mmol/L): 5.2 (calc)

## 2019-04-23 LAB — URIC ACID: Uric Acid, Serum: 7.9 mg/dL (ref 4.0–8.0)

## 2019-04-23 LAB — VITAMIN D 25 HYDROXY (VIT D DEFICIENCY, FRACTURES): Vit D, 25-Hydroxy: 106 ng/mL — ABNORMAL HIGH (ref 30–100)

## 2019-04-23 LAB — LIPID PANEL
Cholesterol: 167 mg/dL (ref <200)
HDL: 49 mg/dL (ref >=40)
LDL Cholesterol (Calc): 81 mg/dL (calc)
Non-HDL Cholesterol (Calc): 118 mg/dL (calc) (ref <130)
Total CHOL/HDL Ratio: 3.4 (calc) (ref <5.0)
Triglycerides: 285 mg/dL — ABNORMAL HIGH (ref <150)

## 2019-04-23 LAB — TSH: TSH: 0.61 mIU/L (ref 0.40–4.50)

## 2019-04-23 LAB — MAGNESIUM: Magnesium: 1.6 mg/dL (ref 1.5–2.5)

## 2019-04-27 DIAGNOSIS — H25812 Combined forms of age-related cataract, left eye: Secondary | ICD-10-CM | POA: Diagnosis not present

## 2019-04-27 DIAGNOSIS — H268 Other specified cataract: Secondary | ICD-10-CM | POA: Diagnosis not present

## 2019-04-27 DIAGNOSIS — H2512 Age-related nuclear cataract, left eye: Secondary | ICD-10-CM | POA: Diagnosis not present

## 2019-05-06 ENCOUNTER — Ambulatory Visit: Payer: Self-pay | Admitting: Adult Health

## 2019-05-06 ENCOUNTER — Other Ambulatory Visit: Payer: Self-pay

## 2019-05-06 ENCOUNTER — Ambulatory Visit (INDEPENDENT_AMBULATORY_CARE_PROVIDER_SITE_OTHER): Payer: PPO | Admitting: Physician Assistant

## 2019-05-06 ENCOUNTER — Encounter: Payer: Self-pay | Admitting: Physician Assistant

## 2019-05-06 VITALS — BP 118/74 | HR 65 | Temp 97.7°F | Ht 67.0 in | Wt 161.2 lb

## 2019-05-06 DIAGNOSIS — G8929 Other chronic pain: Secondary | ICD-10-CM | POA: Diagnosis not present

## 2019-05-06 DIAGNOSIS — M5441 Lumbago with sciatica, right side: Secondary | ICD-10-CM

## 2019-05-06 MED ORDER — DEXAMETHASONE SODIUM PHOSPHATE 100 MG/10ML IJ SOLN
20.0000 mg | Freq: Once | INTRAMUSCULAR | Status: AC
  Administered 2019-05-06: 20 mg via INTRAMUSCULAR

## 2019-05-06 NOTE — Progress Notes (Signed)
Subjective:    Patient ID: [name] [name], male    DOB: 12/21/1950, 68 y.o.   MRN: [id number]  HPI 68 y.o. WM presents with back pain.  Larey SeatFell off a ladder in Feb/ march 2016, has seen Dr. Ophelia CharterYates and Dr. Yetta BarreJones for his back pain in the past, Had MRI back 07/2016 NORMAL.  Negative SI joint X rays, negative HLAB27 in 2016.  No help from epidural injections but had trigger point injection with pain management that helped.  Tried dry needling and PT but it did not help.   Tried lyrica/gabapentin that did not help- he did retry recently and had dizziness even with the 100.  Cymbalta also caused dizziness.  Muscle relaxer make him feel tired.  He states tylenol 3000 helps some. Tramadol helps some but takes very rarely if in a lot of pain. He is getting married and would like a trigger point injection prior to this, last one was 3 months ago at ortho.    Lab Results  Component Value Date   GFRNONAA 53 (L) 04/22/2019    Blood pressure 118/74, pulse 65, temperature 97.7 F (36.5 C), height 5\' 7"  (1.702 m), weight 161 lb 3.2 oz (73.1 kg), SpO2 98 %.  Medications Current Outpatient Medications on File Prior to Visit  Medication Sig  . acetaminophen (TYLENOL) 500 MG tablet Take 500 mg by mouth every 6 (six) hours as needed.   Marland Kitchen. amphetamine-dextroamphetamine (ADDERALL) 20 MG tablet   . atenolol (TENORMIN) 100 MG tablet TAKE 1 TABLET BY MOUTH DAILY  . atorvastatin (LIPITOR) 80 MG tablet Take 1 tablet daily for Cholesterol  . Cholecalciferol (VITAMIN D PO) Take 10,000 Units by mouth daily.   Marland Kitchen. esomeprazole (NEXIUM) 40 MG capsule TAKE ONE CAPSULE BY MOUTH DAILY AT NOON (Patient taking differently: every other day. )  . finasteride (PROSCAR) 5 MG tablet Take 1 tablet (5 mg total) by mouth daily.  Marland Kitchen. LORazepam (ATIVAN) 1 MG tablet Take 1/2 to 1 tablet at Bedtime as needed for Sleep  . losartan-hydrochlorothiazide (HYZAAR) 100-25 MG tablet Take 1/2 to 1 tablet daily as directed for BP  .  pregabalin (LYRICA) 25 MG capsule Take 1 capsule 3 x /day for Pain  . valACYclovir (VALTREX) 500 MG tablet TAKE 1 TABLET BY MOUTH THREE TIMES DAILY FOR FEVER BLISTERS (Patient taking differently: as needed. )   No current facility-administered medications on file prior to visit.     Problem list He has Hypertension; Hyperlipidemia; GERD (gastroesophageal reflux disease); Vitamin D deficiency; Blood glucose abnormal; Medication management; Avascular necrosis of left femoral head (HCC); Insomnia; Gout; DDD (degenerative disc disease), lumbosacral; CKD (chronic kidney disease) stage 3, GFR 30-59 ml/min (HCC); and ADD (attention deficit disorder) on their problem list.   Review of Systems  Constitutional: Negative for chills and fever.  Gastrointestinal: Negative for abdominal pain, constipation, diarrhea, nausea and vomiting.  Genitourinary: Negative for difficulty urinating, dysuria, frequency, hematuria and urgency.  Musculoskeletal: Positive for back pain and gait problem.       Objective:   Physical Exam  Constitutional: He is oriented to person, place, and time. He appears well-developed and well-nourished. No distress.  HENT:  Head: Normocephalic.  Mouth/Throat: Oropharynx is clear and moist. No oropharyngeal exudate.  Eyes: Conjunctivae are normal. No scleral icterus.  Neck: Normal range of motion. Neck supple.  Cardiovascular: Normal rate, regular rhythm, normal heart sounds and intact distal pulses. Exam reveals no gallop and no friction rub.  No murmur heard. Pulmonary/Chest: Effort normal  and breath sounds normal. No respiratory distress. He has no wheezes. He has no rales. He exhibits no tenderness.  Abdominal: Soft. Bowel sounds are normal. He exhibits no distension and no mass. There is no abdominal tenderness. There is no rebound and no guarding.  Musculoskeletal:     Right hip: He exhibits decreased range of motion. He exhibits normal strength, no tenderness, no bony  tenderness, no swelling, no crepitus, no deformity and no laceration.     Lumbar back: He exhibits decreased range of motion, tenderness and pain. He exhibits no bony tenderness, no swelling, no edema, no deformity, no laceration, no spasm and normal pulse.     Comments: Walks with antalgic gait, + pinpoint tenderness bilateral lower back, negative straight leg, normal distal neurovascular exam.   Neurological: He is alert and oriented to person, place, and time.  Skin: Skin is warm and dry. He is not diaphoretic.  Psychiatric: He has a normal mood and affect. His behavior is normal. Judgment and thought content normal.  Nursing note and vitals reviewed.     Assessment & Plan:   Chronic left-sided low back pain with left-sided sciatica Trigger point injection bilateral, handled well, no bleeding - dexamethasone (DECADRON) injection 20 mg; Inject 2 mLs (20 mg total) into the muscle once.   Future Appointments  Date Time Provider Department Center  07/22/2019 11:00 AM Quentin Mulling, PA-C [facility] None  10/27/2019 10:00 AM Lucky Cowboy, MD [facility] None  04/05/2020 11:15 AM Quentin Mulling, PA-C [facility] None

## 2019-05-18 DIAGNOSIS — N183 Chronic kidney disease, stage 3 (moderate): Secondary | ICD-10-CM | POA: Diagnosis not present

## 2019-05-18 DIAGNOSIS — I129 Hypertensive chronic kidney disease with stage 1 through stage 4 chronic kidney disease, or unspecified chronic kidney disease: Secondary | ICD-10-CM | POA: Diagnosis not present

## 2019-05-18 DIAGNOSIS — E559 Vitamin D deficiency, unspecified: Secondary | ICD-10-CM | POA: Diagnosis not present

## 2019-06-10 DIAGNOSIS — M5416 Radiculopathy, lumbar region: Secondary | ICD-10-CM | POA: Diagnosis not present

## 2019-06-16 DIAGNOSIS — M545 Low back pain: Secondary | ICD-10-CM | POA: Diagnosis not present

## 2019-06-16 DIAGNOSIS — M5416 Radiculopathy, lumbar region: Secondary | ICD-10-CM | POA: Diagnosis not present

## 2019-06-22 DIAGNOSIS — M5416 Radiculopathy, lumbar region: Secondary | ICD-10-CM | POA: Diagnosis not present

## 2019-07-14 DIAGNOSIS — M5126 Other intervertebral disc displacement, lumbar region: Secondary | ICD-10-CM | POA: Diagnosis not present

## 2019-07-14 DIAGNOSIS — I10 Essential (primary) hypertension: Secondary | ICD-10-CM | POA: Diagnosis not present

## 2019-07-14 DIAGNOSIS — M5416 Radiculopathy, lumbar region: Secondary | ICD-10-CM | POA: Diagnosis not present

## 2019-07-21 NOTE — Progress Notes (Signed)
FOLLOW UP  Assessment and Plan:   Hypertension Well controlled with current medications  Monitor blood pressure at home; patient to call if consistently greater than 130/80 Continue DASH diet.   Reminder to go to the ER if any CP, SOB, nausea, dizziness, severe HA, changes vision/speech, left arm numbness and tingling and jaw pain.  [facility]; continue statin - triglycerides discussed Continue low cholesterol diet and exercise.  Check lipid panel.   Other abnormal glucose Elevated glucose and insulin levels without A1C abnormalities Continue diet and exercise.  Perform daily foot/skin check, notify office of any concerning changes.  Defer A1C; check bmp  Vitamin D Def Below goal at last visit; continue supplementation to maintain goal of 70-100 Defer Vit D level  Chronic lower back pain Managed by q3 month trigger point injections Verbal consent obtained, risk and benefits were addressed with patient. A trigger point injection was performed at the site of maximal tenderness using 1% plain Lidocaine and Dexamethasone. This was well tolerated, and followed by immediate relief of pain. Return precautions discussed with the patient.  [place] will get next injections prior to his trip.   Continue diet and meds as discussed. Further disposition pending results of labs. Discussed med's effects and SE's.   Over 30 minutes of exam, counseling, chart review, and critical decision making was performed.   Future Appointments  Date Time Provider Department Center  10/27/2019 10:00 AM Lucky CowboyMcKeown, William, MD [facility] None  04/05/2020 11:15 AM Quentin Mullingollier, Judeen Geralds, PA-C [facility] None    ----------------------------------------------------------------------------------------------------------------------  HPI 68 y.o. male  presents for 3 month follow up on hypertension, cholesterol, glucose management, weight monitoring and vitamin D deficiency.   He has been  waking up and his fingers are stuck in the AM. Left 3rd and right 5th digit.   He is also requesting trigger point injection for chronic back pain ongoing after falling off of a ladder in 2016; normal MRI, has been evaluated by Dr. Ophelia CharterYates and Yetta BarreJones. Epidural injections, dry needling, PT, lyrica/gabapentin have been ineffective in the past. He did have success with trigger point injections initiated by pain management. Last injection was 3 months ago.    BMI is Body mass index is 24.53 kg/m., he has been working on diet and exercise. Wt Readings from Last 3 Encounters:  07/22/19 156 lb 9.6 oz (71 kg)  05/06/19 161 lb 3.2 oz (73.1 kg)  04/22/19 160 lb 6.4 oz (72.8 kg)   His blood pressure has been controlled at home, he is on 1 pill of his BP med, today their BP is BP: 118/72  He does workout. He denies chest pain, shortness of breath, dizziness.   He is on cholesterol medication and denies myalgias. His LDL cholesterol is at goal; triglycerides remain elevated. The cholesterol last visit was:   Lab Results  Component Value Date   CHOL 167 04/22/2019   HDL 49 04/22/2019   LDLCALC 81 04/22/2019   TRIG 285 (H) 04/22/2019   CHOLHDL 3.4 04/22/2019    He has been working on diet and exercise for glucose management, and denies foot ulcerations, nausea, polydipsia, polyuria, visual disturbances, vomiting and weight loss. Last A1C in the office was:  Lab Results  Component Value Date   HGBA1C 4.9 04/22/2019   Patient is on Vitamin D supplement slightly high at the last visit, he was taking 10,000 every day but his kidney doctor said it was too high so he is taking 10,000 every other day:  Lab Results  Component Value Date   VD25OH 106 (H) 04/22/2019      Current Medications:  Current Outpatient Medications on File Prior to Visit  Medication Sig  . acetaminophen (TYLENOL) 500 MG tablet Take 500 mg by mouth every 6 (six) hours as needed.   Marland Kitchen amphetamine-dextroamphetamine (ADDERALL) 20 MG  tablet   . atenolol (TENORMIN) 100 MG tablet TAKE 1 TABLET BY MOUTH DAILY  . atorvastatin (LIPITOR) 80 MG tablet Take 1 tablet daily for Cholesterol  . Cholecalciferol (VITAMIN D PO) Take 10,000 Units by mouth daily.   Marland Kitchen esomeprazole (NEXIUM) 40 MG capsule TAKE ONE CAPSULE BY MOUTH DAILY AT NOON (Patient taking differently: every other day. )  . finasteride (PROSCAR) 5 MG tablet Take 1 tablet (5 mg total) by mouth daily.  Marland Kitchen LORazepam (ATIVAN) 1 MG tablet Take 1/2 to 1 tablet at Bedtime as needed for Sleep  . losartan-hydrochlorothiazide (HYZAAR) 100-25 MG tablet Take 1/2 to 1 tablet daily as directed for BP  . valACYclovir (VALTREX) 500 MG tablet TAKE 1 TABLET BY MOUTH THREE TIMES DAILY FOR FEVER BLISTERS (Patient taking differently: as needed. )   No current facility-administered medications on file prior to visit.      Allergies:  Allergies  Allergen Reactions  . Nsaids     Renal Insufficiency     Medical History:  Past Medical History:  Diagnosis Date  . Arthritis    right thumb  . Dry eyes    Takes Restasis  . GERD (gastroesophageal reflux disease)   . Hyperlipidemia   . Hypertension   . Vitamin D deficiency    Family history- Reviewed and unchanged Social history- Reviewed and unchanged   Review of Systems:  Review of Systems  Constitutional: Negative for malaise/fatigue and weight loss.  HENT: Negative for hearing loss and tinnitus.   Eyes: Negative for blurred vision and double vision.  Respiratory: Negative for cough, shortness of breath and wheezing.   Cardiovascular: Negative for chest pain, palpitations, orthopnea, claudication and leg swelling.  Gastrointestinal: Negative for abdominal pain, blood in stool, constipation, diarrhea, heartburn, melena, nausea and vomiting.  Genitourinary: Negative.   Musculoskeletal: Positive for back pain. Negative for joint pain and myalgias.  Skin: Negative for rash.  Neurological: Positive for focal weakness. Negative for  dizziness, tingling, sensory change, weakness and headaches.  Endo/Heme/Allergies: Negative for polydipsia.  Psychiatric/Behavioral: Negative.   All other systems reviewed and are negative.   Physical Exam: BP 118/72   Pulse 66   Temp (!) 97 F (36.1 C)   Ht 5\' 7"  (1.702 m)   Wt 156 lb 9.6 oz (71 kg)   SpO2 99%   BMI 24.53 kg/m  Wt Readings from Last 3 Encounters:  07/22/19 156 lb 9.6 oz (71 kg)  05/06/19 161 lb 3.2 oz (73.1 kg)  04/22/19 160 lb 6.4 oz (72.8 kg)   General Appearance: Well nourished, in no apparent distress. Eyes: PERRLA, EOMs, conjunctiva no swelling or erythema Sinuses: No Frontal/maxillary tenderness ENT/Mouth: Ext aud canals clear, TMs without erythema, bulging. No erythema, swelling, or exudate on post pharynx.  Tonsils not swollen or erythematous. Hearing normal.  Neck: Supple, thyroid normal.  Respiratory: Respiratory effort normal, BS equal bilaterally without rales, rhonchi, wheezing or stridor.  Cardio: RRR with no MRGs. Brisk peripheral pulses without edema.  Abdomen: Soft, + BS.  Non tender, no guarding, rebound, hernias, masses. Lymphatics: Non tender without lymphadenopathy.  Musculoskeletal: Full ROM (pain with ROM in lumbar), 5/5 strength, Normal gait Skin:  Warm, dry without rashes, lesions, ecchymosis.  Neuro: Cranial nerves intact. No cerebellar symptoms.  Psych: Awake and oriented X 3, normal affect, Insight and Judgment appropriate.    Quentin MullingAmanda Noelle Sease, PA-C 11:42 AM [facility]

## 2019-07-22 ENCOUNTER — Ambulatory Visit (INDEPENDENT_AMBULATORY_CARE_PROVIDER_SITE_OTHER): Payer: PPO | Admitting: Physician Assistant

## 2019-07-22 ENCOUNTER — Encounter: Payer: Self-pay | Admitting: Physician Assistant

## 2019-07-22 ENCOUNTER — Other Ambulatory Visit: Payer: Self-pay

## 2019-07-22 VITALS — BP 118/72 | HR 66 | Temp 97.0°F | Ht 67.0 in | Wt 156.6 lb

## 2019-07-22 DIAGNOSIS — M5441 Lumbago with sciatica, right side: Secondary | ICD-10-CM

## 2019-07-22 DIAGNOSIS — N183 Chronic kidney disease, stage 3 unspecified: Secondary | ICD-10-CM

## 2019-07-22 DIAGNOSIS — G8929 Other chronic pain: Secondary | ICD-10-CM | POA: Diagnosis not present

## 2019-07-22 DIAGNOSIS — Z23 Encounter for immunization: Secondary | ICD-10-CM | POA: Diagnosis not present

## 2019-07-22 DIAGNOSIS — R7309 Other abnormal glucose: Secondary | ICD-10-CM

## 2019-07-22 DIAGNOSIS — E559 Vitamin D deficiency, unspecified: Secondary | ICD-10-CM | POA: Diagnosis not present

## 2019-07-22 DIAGNOSIS — Z79899 Other long term (current) drug therapy: Secondary | ICD-10-CM | POA: Diagnosis not present

## 2019-07-22 DIAGNOSIS — I10 Essential (primary) hypertension: Secondary | ICD-10-CM | POA: Diagnosis not present

## 2019-07-22 DIAGNOSIS — E785 Hyperlipidemia, unspecified: Secondary | ICD-10-CM

## 2019-07-22 MED ORDER — DEXAMETHASONE SODIUM PHOSPHATE 100 MG/10ML IJ SOLN
10.0000 mg | Freq: Once | INTRAMUSCULAR | Status: AC
  Administered 2019-07-22: 10 mg via INTRAMUSCULAR

## 2019-07-22 NOTE — Patient Instructions (Signed)
Trigger Finger  Trigger finger (stenosing tenosynovitis) is a condition that causes a finger to get stuck in a bent position. Each finger has a tough, cord-like tissue that connects muscle to bone (tendon), and each tendon is surrounded by a tunnel of tissue (tendon sheath). To move your finger, your tendon needs to slide freely through the sheath. Trigger finger happens when the tendon or the sheath thickens, making it difficult to move your finger. Trigger finger can affect any finger or a thumb. It may affect more than one finger. Mild cases may clear up with rest and medicine. Severe cases require more treatment. What are the causes? Trigger finger is caused by a thickened finger tendon or tendon sheath. The cause of this thickening is not known. What increases the risk? The following factors may make you more likely to develop this condition:  Doing activities that require a strong grip.  Having rheumatoid arthritis, gout, or diabetes.  Being 40-60 years old.  Being a woman. What are the signs or symptoms? Symptoms of this condition include:  Pain when bending or straightening your finger.  Tenderness or swelling where your finger attaches to the palm of your hand.  A lump in the palm of your hand or on the inside of your finger.  Hearing a popping sound when you try to straighten your finger.  Feeling a popping, catching, or locking sensation when you try to straighten your finger.  Being unable to straighten your finger. How is this diagnosed? This condition is diagnosed based on your symptoms and a physical exam. How is this treated? This condition may be treated by:  Resting your finger and avoiding activities that make symptoms worse.  Wearing a finger splint to keep your finger in a slightly bent position.  Taking NSAIDs to relieve pain and swelling.  Injecting medicine (steroids) into the tendon sheath to reduce swelling and irritation. Injections may need to be  repeated.  Having surgery to open the tendon sheath. This may be done if other treatments do not work and you cannot straighten your finger. You may need physical therapy after surgery. Follow these instructions at home:   Use moist heat to help reduce pain and swelling as told by your health care provider.  Rest your finger and avoid activities that make pain worse. Return to normal activities as told by your health care provider.  If you have a splint, wear it as told by your health care provider.  Take over-the-counter and prescription medicines only as told by your health care provider.  Keep all follow-up visits as told by your health care provider. This is important. Contact a health care provider if:  Your symptoms are not improving with home care. Summary  Trigger finger (stenosing tenosynovitis) causes your finger to get stuck in a bent position, and it can make it difficult and painful to straighten your finger.  This condition develops when a finger tendon or tendon sheath thickens.  Treatment starts with resting, wearing a splint, and taking NSAIDs.  In severe cases, surgery to open the tendon sheath may be needed. This information is not intended to replace advice given to you by your health care provider. Make sure you discuss any questions you have with your health care provider. Document Released: 09/14/2004 Document Revised: 11/07/2017 Document Reviewed: 11/05/2016 [employer]vier Patient Education  2020 [employer]  

## 2019-07-23 LAB — CBC WITH DIFFERENTIAL/PLATELET
Absolute Monocytes: 941 cells/uL (ref 200–950)
Basophils Absolute: 42 cells/uL (ref 0–200)
Basophils Relative: 0.5 %
Eosinophils Absolute: 92 cells/uL (ref 15–500)
Eosinophils Relative: 1.1 %
HCT: 44.1 % (ref 38.5–50.0)
Hemoglobin: 15.1 g/dL (ref 13.2–17.1)
Lymphs Abs: 1571 cells/uL (ref 850–3900)
MCH: 33.9 pg — ABNORMAL HIGH (ref 27.0–33.0)
MCHC: 34.2 g/dL (ref 32.0–36.0)
MCV: 98.9 fL (ref 80.0–100.0)
MPV: 10.3 fL (ref 7.5–12.5)
Monocytes Relative: 11.2 %
Neutro Abs: 5754 cells/uL (ref 1500–7800)
Neutrophils Relative %: 68.5 %
Platelets: 257 10*3/uL (ref 140–400)
RBC: 4.46 10*6/uL (ref 4.20–5.80)
RDW: 12.1 % (ref 11.0–15.0)
Total Lymphocyte: 18.7 %
WBC: 8.4 10*3/uL (ref 3.8–10.8)

## 2019-07-23 LAB — COMPLETE METABOLIC PANEL WITH GFR
AG Ratio: 1.8 (calc) (ref 1.0–2.5)
ALT: 32 U/L (ref 9–46)
AST: 29 U/L (ref 10–35)
Albumin: 4.4 g/dL (ref 3.6–5.1)
Alkaline phosphatase (APISO): 55 U/L (ref 35–144)
BUN/Creatinine Ratio: 17 (calc) (ref 6–22)
BUN: 35 mg/dL — ABNORMAL HIGH (ref 7–25)
CO2: 26 mmol/L (ref 20–32)
Calcium: 9.9 mg/dL (ref 8.6–10.3)
Chloride: 104 mmol/L (ref 98–110)
Creat: 2.09 mg/dL — ABNORMAL HIGH (ref 0.70–1.25)
GFR, Est African American: 37 mL/min/{1.73_m2} — ABNORMAL LOW (ref >=60)
GFR, Est Non African American: 32 mL/min/{1.73_m2} — ABNORMAL LOW (ref >=60)
Globulin: 2.5 g/dL (calc) (ref 1.9–3.7)
Glucose, Bld: 95 mg/dL (ref 65–99)
Potassium: 4.6 mmol/L (ref 3.5–5.3)
Sodium: 141 mmol/L (ref 135–146)
Total Bilirubin: 1 mg/dL (ref 0.2–1.2)
Total Protein: 6.9 g/dL (ref 6.1–8.1)

## 2019-07-23 LAB — LIPID PANEL
Cholesterol: 182 mg/dL (ref <200)
HDL: 49 mg/dL (ref >=40)
Non-HDL Cholesterol (Calc): 133 mg/dL (calc) — ABNORMAL HIGH (ref <130)
Total CHOL/HDL Ratio: 3.7 (calc) (ref <5.0)
Triglycerides: 486 mg/dL — ABNORMAL HIGH (ref <150)

## 2019-07-23 LAB — VITAMIN D 25 HYDROXY (VIT D DEFICIENCY, FRACTURES): Vit D, 25-Hydroxy: 79 ng/mL (ref 30–100)

## 2019-07-23 LAB — TSH: TSH: 1.28 mIU/L (ref 0.40–4.50)

## 2019-08-04 DIAGNOSIS — Z79891 Long term (current) use of opiate analgesic: Secondary | ICD-10-CM | POA: Diagnosis not present

## 2019-08-04 DIAGNOSIS — M47817 Spondylosis without myelopathy or radiculopathy, lumbosacral region: Secondary | ICD-10-CM | POA: Diagnosis not present

## 2019-08-04 DIAGNOSIS — G894 Chronic pain syndrome: Secondary | ICD-10-CM | POA: Diagnosis not present

## 2019-08-04 DIAGNOSIS — M4125 Other idiopathic scoliosis, thoracolumbar region: Secondary | ICD-10-CM | POA: Diagnosis not present

## 2019-08-04 DIAGNOSIS — S32000S Wedge compression fracture of unspecified lumbar vertebra, sequela: Secondary | ICD-10-CM | POA: Diagnosis not present

## 2019-08-31 DIAGNOSIS — S32000S Wedge compression fracture of unspecified lumbar vertebra, sequela: Secondary | ICD-10-CM | POA: Diagnosis not present

## 2019-08-31 DIAGNOSIS — M47817 Spondylosis without myelopathy or radiculopathy, lumbosacral region: Secondary | ICD-10-CM | POA: Diagnosis not present

## 2019-08-31 DIAGNOSIS — M4125 Other idiopathic scoliosis, thoracolumbar region: Secondary | ICD-10-CM | POA: Diagnosis not present

## 2019-08-31 DIAGNOSIS — G894 Chronic pain syndrome: Secondary | ICD-10-CM | POA: Diagnosis not present

## 2019-09-13 ENCOUNTER — Other Ambulatory Visit: Payer: Self-pay | Admitting: *Deleted

## 2019-09-13 DIAGNOSIS — I10 Essential (primary) hypertension: Secondary | ICD-10-CM

## 2019-09-13 MED ORDER — ATENOLOL 100 MG PO TABS: ORAL_TABLET | ORAL | 1 refills | Status: DC

## 2019-09-27 ENCOUNTER — Other Ambulatory Visit: Payer: Self-pay | Admitting: Adult Health

## 2019-09-27 DIAGNOSIS — I10 Essential (primary) hypertension: Secondary | ICD-10-CM

## 2019-10-07 DIAGNOSIS — M4125 Other idiopathic scoliosis, thoracolumbar region: Secondary | ICD-10-CM | POA: Diagnosis not present

## 2019-10-07 DIAGNOSIS — M47817 Spondylosis without myelopathy or radiculopathy, lumbosacral region: Secondary | ICD-10-CM | POA: Diagnosis not present

## 2019-10-07 DIAGNOSIS — M5416 Radiculopathy, lumbar region: Secondary | ICD-10-CM | POA: Diagnosis not present

## 2019-10-14 DIAGNOSIS — M5416 Radiculopathy, lumbar region: Secondary | ICD-10-CM | POA: Diagnosis not present

## 2019-10-14 DIAGNOSIS — R030 Elevated blood-pressure reading, without diagnosis of hypertension: Secondary | ICD-10-CM | POA: Diagnosis not present

## 2019-10-25 DIAGNOSIS — M4125 Other idiopathic scoliosis, thoracolumbar region: Secondary | ICD-10-CM | POA: Diagnosis not present

## 2019-10-25 DIAGNOSIS — M5416 Radiculopathy, lumbar region: Secondary | ICD-10-CM | POA: Diagnosis not present

## 2019-10-25 DIAGNOSIS — M47817 Spondylosis without myelopathy or radiculopathy, lumbosacral region: Secondary | ICD-10-CM | POA: Diagnosis not present

## 2019-10-25 DIAGNOSIS — G894 Chronic pain syndrome: Secondary | ICD-10-CM | POA: Diagnosis not present

## 2019-10-27 ENCOUNTER — Encounter: Payer: Self-pay | Admitting: Internal Medicine

## 2019-10-27 ENCOUNTER — Other Ambulatory Visit: Payer: Self-pay

## 2019-10-27 ENCOUNTER — Ambulatory Visit (INDEPENDENT_AMBULATORY_CARE_PROVIDER_SITE_OTHER): Payer: PPO | Admitting: Internal Medicine

## 2019-10-27 VITALS — BP 142/84 | HR 68 | Temp 97.6°F | Resp 16 | Ht 67.0 in | Wt 155.2 lb

## 2019-10-27 DIAGNOSIS — Z136 Encounter for screening for cardiovascular disorders: Secondary | ICD-10-CM

## 2019-10-27 DIAGNOSIS — I10 Essential (primary) hypertension: Secondary | ICD-10-CM

## 2019-10-27 DIAGNOSIS — E559 Vitamin D deficiency, unspecified: Secondary | ICD-10-CM

## 2019-10-27 DIAGNOSIS — M545 Low back pain, unspecified: Secondary | ICD-10-CM

## 2019-10-27 DIAGNOSIS — R7303 Prediabetes: Secondary | ICD-10-CM

## 2019-10-27 DIAGNOSIS — Z1211 Encounter for screening for malignant neoplasm of colon: Secondary | ICD-10-CM

## 2019-10-27 DIAGNOSIS — E88819 Insulin resistance, unspecified: Secondary | ICD-10-CM

## 2019-10-27 DIAGNOSIS — N138 Other obstructive and reflux uropathy: Secondary | ICD-10-CM

## 2019-10-27 DIAGNOSIS — E8881 Metabolic syndrome: Secondary | ICD-10-CM | POA: Diagnosis not present

## 2019-10-27 DIAGNOSIS — M1000 Idiopathic gout, unspecified site: Secondary | ICD-10-CM

## 2019-10-27 DIAGNOSIS — Z79899 Other long term (current) drug therapy: Secondary | ICD-10-CM

## 2019-10-27 DIAGNOSIS — Z125 Encounter for screening for malignant neoplasm of prostate: Secondary | ICD-10-CM

## 2019-10-27 DIAGNOSIS — R7309 Other abnormal glucose: Secondary | ICD-10-CM

## 2019-10-27 DIAGNOSIS — G4700 Insomnia, unspecified: Secondary | ICD-10-CM

## 2019-10-27 DIAGNOSIS — N401 Enlarged prostate with lower urinary tract symptoms: Secondary | ICD-10-CM

## 2019-10-27 DIAGNOSIS — Z8249 Family history of ischemic heart disease and other diseases of the circulatory system: Secondary | ICD-10-CM | POA: Diagnosis not present

## 2019-10-27 DIAGNOSIS — Z0000 Encounter for general adult medical examination without abnormal findings: Secondary | ICD-10-CM | POA: Diagnosis not present

## 2019-10-27 DIAGNOSIS — E782 Mixed hyperlipidemia: Secondary | ICD-10-CM

## 2019-10-27 DIAGNOSIS — Z0001 Encounter for general adult medical examination with abnormal findings: Secondary | ICD-10-CM

## 2019-10-27 DIAGNOSIS — G8929 Other chronic pain: Secondary | ICD-10-CM

## 2019-10-27 MED ORDER — LORAZEPAM 1 MG PO TABS: ORAL_TABLET | ORAL | 0 refills | Status: DC

## 2019-10-27 MED ORDER — ATORVASTATIN CALCIUM 80 MG PO TABS: ORAL_TABLET | ORAL | 3 refills | Status: DC

## 2019-10-27 NOTE — Patient Instructions (Signed)

## 2019-10-27 NOTE — Progress Notes (Signed)
Annual  Screening/Preventative Visit  & Comprehensive Evaluation & Examination     This very nice 68 y.o. DWM presents for a Screening /Preventative Visit & comprehensive evaluation and management of multiple medical co-morbidities.  Patient has been followed for HTN, HLD, Prediabetes and Vitamin D Deficiency. Patient has GERD controlled on his meds. He  has ADD treated w/Adderal with improved focus & concentration.     HTN predates since 49. Patient's BP has been controlled at home.  Today's BP: (!) 142/84. Patient denies any cardiac symptoms as chest pain, palpitations, shortness of breath, dizziness or ankle swelling.     Patient's hyperlipidemia is near controlled with diet and Lipitor. Patient denies myalgias or other medication SE's. Last lipids were at goal albeit elevated Trig's:  Lab Results  Component Value Date   CHOL 182 07/22/2019   HDL 49 07/22/2019   LDLCALC not calculated. 07/22/2019   TRIG 486 (H) 07/22/2019   CHOLHDL 3.7 07/22/2019      Patient has hx/o prediabetes / Insulin Resistance(Insulin 28.05/2015 &33.7 / 2018) and patient denies reactive hypoglycemic symptoms, visual blurring, diabetic polys or paresthesias. Last A1c was Normal & at goal:  Lab Results  Component Value Date   HGBA1C 4.9 04/22/2019       Finally, patient has history of Vitamin D Deficiency ("28" / 2008) and last vitamin D was at goal:  Lab Results  Component Value Date   VD25OH 79 07/22/2019   Current Outpatient Medications on File Prior to Visit  Medication Sig  . acetaminophen (TYLENOL) 500 MG tablet Take 500 mg by mouth every 6 (six) hours as needed.   Marland Kitchen amphetamine-dextroamphetamine (ADDERALL) 20 MG tablet   . atenolol (TENORMIN) 100 MG tablet Take 1 tablet daily for blood pressure.  Marland Kitchen atorvastatin (LIPITOR) 80 MG tablet Take 1 tablet daily for Cholesterol  . Cholecalciferol (VITAMIN D PO) Take 10,000 Units by mouth daily.   Marland Kitchen esomeprazole (NEXIUM) 40 MG capsule TAKE ONE  CAPSULE BY MOUTH DAILY AT NOON (Patient taking differently: every other day. )  . finasteride (PROSCAR) 5 MG tablet Take 1 tablet (5 mg total) by mouth daily.  Marland Kitchen LORazepam (ATIVAN) 1 MG tablet Take 1/2 to 1 tablet at Bedtime as needed for Sleep  . losartan-hydrochlorothiazide (HYZAAR) 100-25 MG tablet Take 1 tablet Daily for BP  . valACYclovir (VALTREX) 500 MG tablet TAKE 1 TABLET BY MOUTH THREE TIMES DAILY FOR FEVER BLISTERS (Patient taking differently: as needed. )   No current facility-administered medications on file prior to visit.    Allergies  Allergen Reactions  . Nsaids     Renal Insufficiency   Past Medical History:  Diagnosis Date  . Arthritis    right thumb  . Dry eyes    Takes Restasis  . GERD (gastroesophageal reflux disease)   . Hyperlipidemia   . Hypertension   . Vitamin D deficiency    Health Maintenance  Topic Date Due  . PNA vac Low Risk Adult (2 of 2 - PPSV23) 01/22/2019  . INFLUENZA VACCINE  07/10/2019  . COLONOSCOPY  07/15/2023  . TETANUS/TDAP  07/21/2029  . Hepatitis C Screening  Completed   Immunization History  Administered Date(s) Administered  . Influenza Split 09/22/2013  . Influenza, High Dose Seasonal PF 10/22/2019  . Influenza-Unspecified 09/08/2017, 08/19/2018  . PPD Test 09/13/2014, 07/25/2015, 08/08/2016, 09/08/2017, 10/09/2018  . Pneumococcal Conjugate-13 01/22/2018, 10/22/2019  . Pneumococcal Polysaccharide-23 01/19/2009  . Td 07/22/2019  . Tdap 01/19/2009  . Zoster 01/29/2013   Last  Colon - 07/14/2018 - Dr Randa EvensEdwards - Negative - Recc 10 yr f/u due Aug 2029  Past Surgical History:  Procedure Laterality Date  . COLONOSCOPY W/ POLYPECTOMY    . ESOPHAGOGASTRODUODENOSCOPY  1990  . 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 Osteoporosis Mother     . Schizophrenia Mother   . Heart disease Father   . COPD Father   . Diabetes Brother    Social History   Socioeconomic History  . Marital status: Divorced    Spouse name: Not on file  . Number of children: Not on file  . Years of education: Not on file  . Highest education level: Not on file  Occupational History  . Not on file  Tobacco Use  . Smoking status: Never Smoker  . Smokeless tobacco: Never Used  Substance and Sexual Activity  . Alcohol use: Yes    Comment: social  . Drug use: No    ROS Constitutional: Denies fever, chills, weight loss/gain, headaches, insomnia,  night sweats or change in appetite. Does c/o fatigue. Eyes: Denies redness, blurred vision, diplopia, discharge, itchy or watery eyes.  ENT: Denies discharge, congestion, post nasal drip, epistaxis, sore throat, earache, hearing loss, dental pain, Tinnitus, Vertigo, Sinus pain or snoring.  Cardio: Denies chest pain, palpitations, irregular heartbeat, syncope, dyspnea, diaphoresis, orthopnea, PND, claudication or edema Respiratory: denies cough, dyspnea, DOE, pleurisy, hoarseness, laryngitis or wheezing.  Gastrointestinal: Denies dysphagia, heartburn, reflux, water brash, pain, cramps, nausea, vomiting, bloating, diarrhea, constipation, hematemesis, melena, hematochezia, jaundice or hemorrhoids Genitourinary: Denies dysuria, frequency, urgency, nocturia, hesitancy, discharge, hematuria or flank pain Musculoskeletal: Denies arthralgia, myalgia, stiffness, Jt. Swelling, pain, limp or strain/sprain. Denies Falls. Skin: Denies puritis, rash, hives, warts, acne, eczema or change in skin lesion Neuro: No weakness, tremor, incoordination, spasms, paresthesia or pain Psychiatric: Denies confusion, memory loss or sensory loss. Denies Depression. Endocrine: Denies change in weight, skin, hair change, nocturia, and paresthesia, diabetic polys, visual blurring or hyper / hypo glycemic episodes.  Heme/Lymph: No excessive  bleeding, bruising or enlarged lymph nodes.  Physical Exam  BP (!) 142/84   Pulse 68   Temp 97.6 F (36.4 C)   Resp 16   Ht 5\' 7"  (1.702 m)   Wt 155 lb 3.2 oz (70.4 kg)   BMI 24.31 kg/m   General Appearance: Well nourished and well groomed and in no apparent distress.  Eyes: PERRLA, EOMs, conjunctiva no swelling or erythema, normal fundi and vessels. Sinuses: No frontal/maxillary tenderness ENT/Mouth: EACs patent / TMs  nl. Nares clear without erythema, swelling, mucoid exudates. Oral hygiene is good. No erythema, swelling, or exudate. Tongue normal, non-obstructing. Tonsils not swollen or erythematous. Hearing normal.  Neck: Supple, thyroid not palpable. No bruits, nodes or JVD. Respiratory: Respiratory effort normal.  BS equal and clear bilateral without rales, rhonci, wheezing or stridor. Cardio: Heart sounds are normal with regular rate and rhythm and no murmurs, rubs or gallops. Peripheral pulses are normal and equal bilaterally without edema. No aortic or femoral bruits. Chest: symmetric with normal excursions and percussion.  Abdomen: Soft, with Nl bowel sounds. Nontender, no guarding, rebound, hernias, masses, or organomegaly.  Lymphatics: Non tender without lymphadenopathy.  Musculoskeletal: Full ROM all peripheral extremities, joint stability, 5/5 strength, and normal gait. Skin: Warm and dry without rashes, lesions, cyanosis, clubbing or  ecchymosis.  Neuro: Cranial nerves intact, reflexes equal bilaterally. Normal muscle tone, no cerebellar symptoms. Sensation intact.  Pysch: Alert and oriented X 3 with normal affect, insight and judgment appropriate.   Assessment and Plan  1. Annual Preventative/Screening Exam   2. Essential hypertension  - EKG 12-Lead - [place], retroperitnl abd,  ltd - Urinalysis, Routine w reflex microscopic - Microalbumin / Creatinine Urine Ratio - CBC with Diff - COMPLETE METABOLIC PANEL WITH GFR - Magnesium - TSH  3. Hyperlipidemia,  mixed  - EKG 12-Lead - [place], retroperitnl abd,  ltd - Lipid Profile - TSH  4. Abnormal glucose  - EKG 12-Lead - [place], retroperitnl abd,  ltd - Hemoglobin A1c ([facility]) - Insulin, random  5. Vitamin D deficiency  - Vitamin D (25 hydroxy)  6. Prediabetes  - EKG 12-Lead - [place], retroperitnl abd,  ltd - Hemoglobin A1c ([facility]) - Insulin, random  7. Insulin resistance  - EKG 12-Lead - Hemoglobin A1c ([facility]) - Insulin, random  8. Chronic bilateral low back pain without sciatica   9. Idiopathic gout  - Uric acid  10. Screening for colorectal cancer  - POC Hemoccult Bld/Stl  11. 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 Screening for ischemic heart disease  - EKG 12-Lead - [place], retroperitnl abd,  ltd  14. FHx: heart disease  - EKG 12-Lead - [place], retroperitnl abd,  ltd  15. Screening for AAA (aortic abdominal aneurysm)  - [place], retroperitnl abd,  ltd  16. Medication management  - Urinalysis, Routine w reflex microscopic - Microalbumin / Creatinine Urine Ratio - CBC with Diff - COMPLETE METABOLIC PANEL WITH GFR - Magnesium - Lipid Profile - TSH - Hemoglobin A1c ([facility]) - Insulin, random - Vitamin D (25 hydroxy)        Patient was counseled in prudent diet, weight control to achieve/maintain BMI less than 25, BP monitoring, regular exercise and medications as discussed.  Discussed med effects and SE's. Routine screening labs and tests as requested with regular follow-up as recommended. Over 40 minutes of exam, counseling, chart review and high complex critical decision making was performed   Kirtland Bouchard, MD

## 2019-10-28 LAB — CBC WITH DIFFERENTIAL/PLATELET
Absolute Monocytes: 684 cells/uL (ref 200–950)
Basophils Absolute: 58 cells/uL (ref 0–200)
Basophils Relative: 1 %
Eosinophils Absolute: 52 cells/uL (ref 15–500)
Eosinophils Relative: 0.9 %
HCT: 41.6 % (ref 38.5–50.0)
Hemoglobin: 14.3 g/dL (ref 13.2–17.1)
Lymphs Abs: 1218 cells/uL (ref 850–3900)
MCH: 35.2 pg — ABNORMAL HIGH (ref 27.0–33.0)
MCHC: 34.4 g/dL (ref 32.0–36.0)
MCV: 102.5 fL — ABNORMAL HIGH (ref 80.0–100.0)
MPV: 10.4 fL (ref 7.5–12.5)
Monocytes Relative: 11.8 %
Neutro Abs: 3787 cells/uL (ref 1500–7800)
Neutrophils Relative %: 65.3 %
Platelets: 202 10*3/uL (ref 140–400)
RBC: 4.06 10*6/uL — ABNORMAL LOW (ref 4.20–5.80)
RDW: 11.9 % (ref 11.0–15.0)
Total Lymphocyte: 21 %
WBC: 5.8 10*3/uL (ref 3.8–10.8)

## 2019-10-28 LAB — COMPLETE METABOLIC PANEL WITH GFR
AG Ratio: 2.3 (calc) (ref 1.0–2.5)
ALT: 20 U/L (ref 9–46)
AST: 24 U/L (ref 10–35)
Albumin: 4.4 g/dL (ref 3.6–5.1)
Alkaline phosphatase (APISO): 49 U/L (ref 35–144)
BUN/Creatinine Ratio: 18 (calc) (ref 6–22)
BUN: 26 mg/dL — ABNORMAL HIGH (ref 7–25)
CO2: 24 mmol/L (ref 20–32)
Calcium: 9.2 mg/dL (ref 8.6–10.3)
Chloride: 105 mmol/L (ref 98–110)
Creat: 1.44 mg/dL — ABNORMAL HIGH (ref 0.70–1.25)
GFR, Est African American: 58 mL/min/{1.73_m2} — ABNORMAL LOW (ref >=60)
GFR, Est Non African American: 50 mL/min/{1.73_m2} — ABNORMAL LOW (ref >=60)
Globulin: 1.9 g/dL (calc) (ref 1.9–3.7)
Glucose, Bld: 87 mg/dL (ref 65–99)
Potassium: 3.6 mmol/L (ref 3.5–5.3)
Sodium: 140 mmol/L (ref 135–146)
Total Bilirubin: 0.5 mg/dL (ref 0.2–1.2)
Total Protein: 6.3 g/dL (ref 6.1–8.1)

## 2019-10-28 LAB — URINALYSIS, ROUTINE W REFLEX MICROSCOPIC
Bilirubin Urine: NEGATIVE
Glucose, UA: NEGATIVE
Hgb urine dipstick: NEGATIVE
Ketones, ur: NEGATIVE
Leukocytes,Ua: NEGATIVE
Nitrite: NEGATIVE
Protein, ur: NEGATIVE
Specific Gravity, Urine: 1.017 (ref 1.001–1.03)
pH: 6.5 (ref 5.0–8.0)

## 2019-10-28 LAB — PSA: PSA: 0.4 ng/mL (ref <=4.0)

## 2019-10-28 LAB — INSULIN, RANDOM: Insulin: 18 u[IU]/mL

## 2019-10-28 LAB — LIPID PANEL
Cholesterol: 139 mg/dL (ref <200)
HDL: 56 mg/dL (ref >=40)
LDL Cholesterol (Calc): 56 mg/dL (calc)
Non-HDL Cholesterol (Calc): 83 mg/dL (calc) (ref <130)
Total CHOL/HDL Ratio: 2.5 (calc) (ref <5.0)
Triglycerides: 207 mg/dL — ABNORMAL HIGH (ref <150)

## 2019-10-28 LAB — HEMOGLOBIN A1C
Hgb A1c MFr Bld: 4.7 % of total Hgb (ref <5.7)
Mean Plasma Glucose: 88 (calc)
eAG (mmol/L): 4.9 (calc)

## 2019-10-28 LAB — MAGNESIUM: Magnesium: 1.7 mg/dL (ref 1.5–2.5)

## 2019-10-28 LAB — MICROALBUMIN / CREATININE URINE RATIO
Creatinine, Urine: 112 mg/dL (ref 20–320)
Microalb Creat Ratio: 3 ug/mg{creat} (ref <30)
Microalb, Ur: 0.3 mg/dL

## 2019-10-28 LAB — VITAMIN D 25 HYDROXY (VIT D DEFICIENCY, FRACTURES): Vit D, 25-Hydroxy: 95 ng/mL (ref 30–100)

## 2019-10-28 LAB — TSH: TSH: 0.97 mIU/L (ref 0.40–4.50)

## 2019-10-28 LAB — URIC ACID: Uric Acid, Serum: 6.3 mg/dL (ref 4.0–8.0)

## 2019-11-15 DIAGNOSIS — Z1159 Encounter for screening for other viral diseases: Secondary | ICD-10-CM | POA: Diagnosis not present

## 2019-11-19 DIAGNOSIS — M5416 Radiculopathy, lumbar region: Secondary | ICD-10-CM | POA: Diagnosis not present

## 2019-11-19 DIAGNOSIS — M48062 Spinal stenosis, lumbar region with neurogenic claudication: Secondary | ICD-10-CM | POA: Diagnosis not present

## 2019-11-19 HISTORY — PX: BACK SURGERY: SHX140

## 2019-12-27 ENCOUNTER — Other Ambulatory Visit: Payer: Self-pay | Admitting: *Deleted

## 2019-12-27 MED ORDER — ESOMEPRAZOLE MAGNESIUM 40 MG PO CPDR: DELAYED_RELEASE_CAPSULE | ORAL | 3 refills | Status: DC

## 2019-12-29 NOTE — Progress Notes (Signed)
WELLNESS VISIT   Assessment and Plan:   Encounter for Medicare annual wellness exam 1 year  Avascular necrosis of left femoral head (HCC) Monitor  DDD (degenerative disc disease), lumbosacral Continue follow up neurosurgery/dr. [name] as needed  Essential hypertension - continue medications, DASH diet, exercise and monitor at home. Call if greater than 130/80.   Stage 3a chronic kidney disease Discussed cutting back on NSAIDS and steroids  Gastroesophageal reflux disease, unspecified whether esophagitis present Continue PPI/H2 blocker, diet discussed  Hyperlipidemia, unspecified hyperlipidemia type check lipids decrease fatty foods increase activity.   Medication management Follow up  Vitamin D deficiency Continue supplement  Insomnia, unspecified type  good sleep hygiene discussed, increase day time activity  Idiopathic gout, unspecified chronicity, unspecified site  recheck Uric acid as needed, Diet discussed, continue medications.  Blood glucose abnormal Discussed disease progression and risks Discussed diet/exercise, weight management and risk modification  Attention deficit disorder, unspecified hyperactivity presence -  Continue ADD medication, helps with focus, no AE's. The patient was counseled on the addictive nature of the medication and was encouraged to take drug holidays when not needed.   Chronic lower back pain Managed by AS needed q5-6 month trigger point injections since his surgery Verbal consent obtained, risk and benefits were addressed with patient. A trigger point injection was performed at the site of maximal tenderness using 1% plain Lidocaine and Dexamethasone. This was well tolerated, and followed by immediate relief of pain. Return precautions discussed with the patient.    Continue diet and meds as discussed. Further disposition pending results of labs. Discussed med's effects and SE's.   Over 30 minutes of exam, counseling, chart  review, and critical decision making was performed.   Future Appointments  Date Time Provider Department Center  02/03/2020  3:00 PM Judd Gaudier, NP [facility] None  02/06/2020  1:00 PM [facility]  05/03/2020  2:30 PM Lucky Cowboy, MD [facility] None  11/23/2020 10:00 AM Lucky Cowboy, MD [facility] None    ----------------------------------------------------------------------------------------------------------------------  HPI 69 y.o. male  presents for back pain and wellness visit.    He is also requesting trigger point injection for chronic back pain ongoing after falling off of a ladder in 2016; normal MRI, has been evaluated by Dr. Ophelia Charter and [name]. Epidural injections, dry needling, PT, lyrica/gabapentin have been ineffective in the past. He did have success with trigger point injections initiated by pain management. Last injection was 5 months ago. He had a laminectomy with Dr. [name] in Dec and states his right leg pain is better. Dr. [name] thinks the left sided pain is from a cyst.    BMI is Body mass index is 24.21 kg/m., he has been working on diet and exercise. Wt Readings from Last 3 Encounters:  12/30/19 154 lb 9.6 oz (70.1 kg)  10/27/19 155 lb 3.2 oz (70.4 kg)  07/22/19 156 lb 9.6 oz (71 kg)   Lab Results  Component Value Date   GFRNONAA 50 (L) 10/27/2019   His blood pressure has been controlled at home, he is on 1 pill of his BP med, today their BP is BP: 120/80  He does workout. He denies chest pain, shortness of breath, dizziness.   He is on cholesterol medication and denies myalgias. His LDL cholesterol is at goal; triglycerides remain elevated. The cholesterol last visit was:   Lab Results  Component Value Date   CHOL 139 10/27/2019   HDL 56 10/27/2019   LDLCALC 56 10/27/2019   TRIG  207 (H) 10/27/2019   CHOLHDL 2.5 10/27/2019    He has been working on diet and exercise for glucose management, and denies foot  ulcerations, nausea, polydipsia, polyuria, visual disturbances, vomiting and weight loss. Last A1C in the office was:  Lab Results  Component Value Date   HGBA1C 4.7 10/27/2019   Patient is on Vitamin D supplement slightly high at the last visit, he was taking 10,000 every day but his kidney doctor said it was too high so he is taking 10,000 every other day:   Lab Results  Component Value Date   VD25OH 95 10/27/2019      Current Medications:    Current Outpatient Medications (Cardiovascular):  .  atenolol (TENORMIN) 100 MG tablet, Take 1 tablet daily for blood pressure. Marland Kitchen  atorvastatin (LIPITOR) 80 MG tablet, Take 1 tablet Daily for Cholesterol .  losartan-hydrochlorothiazide (HYZAAR) 100-25 MG tablet, Take 1 tablet Daily for BP   Current Outpatient Medications (Analgesics):  .  acetaminophen (TYLENOL) 500 MG tablet, Take 500 mg by mouth every 6 (six) hours as needed.    Current Outpatient Medications (Other):  .  amphetamine-dextroamphetamine (ADDERALL) 20 MG tablet,  .  Cholecalciferol (VITAMIN D PO), Take 10,000 Units by mouth daily.  Marland Kitchen  esomeprazole (NEXIUM) 40 MG capsule, TAKE ONE CAPSULE DAILY AT NOON FOR REFLUX. .  finasteride (PROSCAR) 5 MG tablet, Take 1 tablet (5 mg total) by mouth daily. Marland Kitchen  LORazepam (ATIVAN) 1 MG tablet, Take 1/2 to 1 tablet at Bedtime as needed for Sleep .  valACYclovir (VALTREX) 500 MG tablet, TAKE 1 TABLET BY MOUTH THREE TIMES DAILY FOR FEVER BLISTERS (Patient taking differently: as needed. )   Medical History:  Past Medical History:  Diagnosis Date  . Arthritis    right thumb  . Dry eyes    Takes Restasis  . GERD (gastroesophageal reflux disease)   . Hyperlipidemia   . Hypertension   . Vitamin D deficiency    Allergies Allergies  Allergen Reactions  . Nsaids     Renal Insufficiency    SURGICAL HISTORY He  has a past surgical history that includes Esophagogastroduodenoscopy (1990); Colonoscopy w/ polypectomy; and Total hip  arthroplasty (Left, 10/28/2014). FAMILY HISTORY His family history includes COPD in his father; Diabetes in his brother; Heart disease in his father; Osteoporosis in his mother; Schizophrenia in his mother. SOCIAL HISTORY He  reports that he has never smoked. He has never used smokeless tobacco. He reports current alcohol use. He reports that he does not use drugs.  Immunization History  Administered Date(s) Administered  . Influenza Split 09/22/2013  . Influenza, High Dose Seasonal PF 10/22/2019  . Influenza-Unspecified 09/08/2017, 08/19/2018  . PPD Test 09/13/2014, 07/25/2015, 08/08/2016, 09/08/2017, 10/09/2018  . Pneumococcal Conjugate-13 01/22/2018, 10/22/2019  . Pneumococcal Polysaccharide-23 01/19/2009  . Td 07/22/2019  . Tdap 01/19/2009  . Zoster 01/29/2013   Preventative care: Last colonoscopy: 07/2018  Prior vaccinations: TD or Tdap: 2020  Influenza: 2020 Pneumococcal: 2010 Prevnar13: 2019 Shingles/Zostavax: 2014 suggest shingrix by age 29  Names of Other Physician/Practitioners you currently use: 1. [facility] here for primary care 2. Dr. Sherrine Maples, eye doctor, last visit 2020 3. [place], dentist, last visit Jan 2020 Patient Care Team: Lucky Cowboy, MD as PCP - General (Internal Medicine)  MEDICARE WELLNESS OBJECTIVES: Physical activity:   Cardiac risk factors:   Depression/mood screen:   Depression screen [facility] 2/9 10/27/2019  Decreased Interest 0  Down, Depressed, Hopeless 0  PHQ - 2 Score  0  Altered sleeping -  Tired, decreased energy -  Change in appetite -  Feeling bad or failure about yourself  -  Trouble concentrating -  Moving slowly or fidgety/restless -  Suicidal thoughts -  PHQ-9 Score -  Difficult doing work/chores -    ADLs:  In your present state of health, do you have any difficulty performing the following activities: 10/27/2019 04/22/2019  Hearing? N N  Vision? N N  Difficulty concentrating or  making decisions? N N  Walking or climbing stairs? N N  Dressing or bathing? N N  Doing errands, shopping? - N  Some recent data might be hidden     Cognitive Testing  Alert? Yes  Normal Appearance?Yes  Oriented to person? Yes  Place? Yes   Time? Yes  Recall of three objects?  Yes  Can perform simple calculations? Yes  Displays appropriate judgment?Yes  Can read the correct time from a watch face?Yes  EOL planning: Does Patient Have a Medical Advance Directive?: No Does patient want to make changes to medical advance directive?: Yes (MAU/Ambulatory/Procedural Areas - Information given) No, given information  Review of Systems:  Review of Systems  Constitutional: Negative for malaise/fatigue and weight loss.  HENT: Negative for hearing loss and tinnitus.   Eyes: Negative for blurred vision and double vision.  Respiratory: Negative for cough, shortness of breath and wheezing.   Cardiovascular: Negative for chest pain, palpitations, orthopnea, claudication and leg swelling.  Gastrointestinal: Negative for abdominal pain, blood in stool, constipation, diarrhea, heartburn, melena, nausea and vomiting.  Genitourinary: Negative.   Musculoskeletal: Positive for back pain. Negative for joint pain and myalgias.  Skin: Negative for rash.  Neurological: Positive for focal weakness. Negative for dizziness, tingling, sensory change, weakness and headaches.  Endo/Heme/Allergies: Negative for polydipsia.  Psychiatric/Behavioral: Negative.   All other systems reviewed and are negative.   Physical Exam: BP 120/80   Pulse 66   Temp (!) 97.3 F (36.3 C)   Ht 5\' 7"  (1.702 m)   Wt 154 lb 9.6 oz (70.1 kg)   SpO2 99%   BMI 24.21 kg/m  Wt Readings from Last 3 Encounters:  12/30/19 154 lb 9.6 oz (70.1 kg)  10/27/19 155 lb 3.2 oz (70.4 kg)  07/22/19 156 lb 9.6 oz (71 kg)   General Appearance: Well nourished, in no apparent distress. Eyes: PERRLA, EOMs, conjunctiva no swelling or  erythema Sinuses: No Frontal/maxillary tenderness ENT/Mouth: Ext aud canals clear, TMs without erythema, bulging. No erythema, swelling, or exudate on post pharynx.  Tonsils not swollen or erythematous. Hearing normal.  Neck: Supple, thyroid normal.  Respiratory: Respiratory effort normal, BS equal bilaterally without rales, rhonchi, wheezing or stridor.  Cardio: RRR with no MRGs. Brisk peripheral pulses without edema.  Abdomen: Soft, + BS.  Non tender, no guarding, rebound, hernias, masses. Lymphatics: Non tender without lymphadenopathy.  Musculoskeletal: Full ROM (pain with ROM in lumbar), 5/5 strength, Normal gait, well healing vertical scar Skin: Warm, dry without rashes, lesions, ecchymosis.  Neuro: Cranial nerves intact. No cerebellar symptoms.  Psych: Awake and oriented X 3, normal affect, Insight and Judgment appropriate.    Medicare Attestation I have personally reviewed: The patient's medical and social history Their use of alcohol, tobacco or illicit drugs Their current medications and supplements The patient's functional ability including ADLs,fall risks, home safety risks, cognitive, and hearing and visual impairment Diet and physical activities Evidence for depression or mood disorders  The patient's weight, height, BMI, and visual acuity have been recorded  in the chart.  I have made referrals, counseling, and provided education to the patient based on review of the above and I have provided the patient with a written personalized care plan for preventive services.     Quentin Mulling, PA-C 11:14 AM [facility]

## 2019-12-30 ENCOUNTER — Other Ambulatory Visit: Payer: Self-pay

## 2019-12-30 ENCOUNTER — Ambulatory Visit: Payer: Self-pay | Admitting: Physician Assistant

## 2019-12-30 ENCOUNTER — Ambulatory Visit (INDEPENDENT_AMBULATORY_CARE_PROVIDER_SITE_OTHER): Payer: PPO | Admitting: Physician Assistant

## 2019-12-30 ENCOUNTER — Encounter: Payer: Self-pay | Admitting: Physician Assistant

## 2019-12-30 VITALS — BP 120/80 | HR 66 | Temp 97.3°F | Ht 67.0 in | Wt 154.6 lb

## 2019-12-30 DIAGNOSIS — E785 Hyperlipidemia, unspecified: Secondary | ICD-10-CM

## 2019-12-30 DIAGNOSIS — R6889 Other general symptoms and signs: Secondary | ICD-10-CM

## 2019-12-30 DIAGNOSIS — M1000 Idiopathic gout, unspecified site: Secondary | ICD-10-CM

## 2019-12-30 DIAGNOSIS — N1831 Chronic kidney disease, stage 3a: Secondary | ICD-10-CM

## 2019-12-30 DIAGNOSIS — M4125 Other idiopathic scoliosis, thoracolumbar region: Secondary | ICD-10-CM | POA: Diagnosis not present

## 2019-12-30 DIAGNOSIS — I10 Essential (primary) hypertension: Secondary | ICD-10-CM

## 2019-12-30 DIAGNOSIS — Z0001 Encounter for general adult medical examination with abnormal findings: Secondary | ICD-10-CM | POA: Diagnosis not present

## 2019-12-30 DIAGNOSIS — R7309 Other abnormal glucose: Secondary | ICD-10-CM

## 2019-12-30 DIAGNOSIS — M5416 Radiculopathy, lumbar region: Secondary | ICD-10-CM | POA: Diagnosis not present

## 2019-12-30 DIAGNOSIS — Z0000 Encounter for general adult medical examination without abnormal findings: Secondary | ICD-10-CM

## 2019-12-30 DIAGNOSIS — M87052 Idiopathic aseptic necrosis of left femur: Secondary | ICD-10-CM

## 2019-12-30 DIAGNOSIS — E559 Vitamin D deficiency, unspecified: Secondary | ICD-10-CM

## 2019-12-30 DIAGNOSIS — Z79899 Other long term (current) drug therapy: Secondary | ICD-10-CM

## 2019-12-30 DIAGNOSIS — G4700 Insomnia, unspecified: Secondary | ICD-10-CM

## 2019-12-30 DIAGNOSIS — F988 Other specified behavioral and emotional disorders with onset usually occurring in childhood and adolescence: Secondary | ICD-10-CM

## 2019-12-30 DIAGNOSIS — M47817 Spondylosis without myelopathy or radiculopathy, lumbosacral region: Secondary | ICD-10-CM | POA: Diagnosis not present

## 2019-12-30 DIAGNOSIS — G894 Chronic pain syndrome: Secondary | ICD-10-CM | POA: Diagnosis not present

## 2019-12-30 DIAGNOSIS — M5137 Other intervertebral disc degeneration, lumbosacral region: Secondary | ICD-10-CM

## 2019-12-30 DIAGNOSIS — K219 Gastro-esophageal reflux disease without esophagitis: Secondary | ICD-10-CM

## 2019-12-30 NOTE — Patient Instructions (Signed)

## 2020-01-16 ENCOUNTER — Ambulatory Visit: Payer: PPO

## 2020-01-21 ENCOUNTER — Encounter: Payer: Self-pay | Admitting: Internal Medicine

## 2020-02-02 ENCOUNTER — Encounter: Payer: Self-pay | Admitting: Adult Health

## 2020-02-02 NOTE — Progress Notes (Signed)
FOLLOW UP  Assessment and Plan:   Hypertension Well controlled with current medications  Monitor blood pressure at home; patient to call if consistently greater than 130/80 Continue DASH diet.   Reminder to go to the ER if any CP, SOB, nausea, dizziness, severe HA, changes vision/speech, left arm numbness and tingling and jaw pain.  Cholesterol Currently at [facility]; continue statin - triglycerides discussed Continue low cholesterol diet and exercise.  Check lipid panel.   Other abnormal glucose Elevated glucose and insulin levels without A1C abnormalities Continue diet and exercise.  Perform daily foot/skin check, notify office of any concerning changes.  Defer A1C; check CMP  Vitamin D Def At goal at last visit; continue supplementation to maintain goal of 60-100 Defer Vit D level  Chronic lower back pain S/p surgery; improved; follows with Dr. Ronnald Ramp; tylenol PRN working fairly Encouraged gradual increase in activity level  ADD Continue medications Helps with focus, no AE's. The patient was counseled on the addictive nature of the medication and was encouraged to take drug holidays when not needed.   Insomnia good sleep hygiene discussed, increase day time activity Ativan PRN, limit to 3-4 days/week   GERD Well managed on current medications; failed PPI taper trial  Discussed diet, avoiding triggers and other lifestyle changes   Continue diet and meds as discussed. Further disposition pending results of labs. Discussed med's effects and SE's.   Over 30 minutes of exam, counseling, chart review, and critical decision making was performed.   Future Appointments  Date Time Provider [place]  05/03/2020  2:30 PM Unk Pinto, MD [facility] None  11/23/2020 10:00 AM Unk Pinto, MD [facility] None    ----------------------------------------------------------------------------------------------------------------------  HPI 69 y.o. male  presents  for 3 month follow up on hypertension, cholesterol, glucose management, weight monitoring and vitamin D deficiency.   Retired, has a place at [employer] at [place] back pain history; He had out patient back surgery by Dr. Ronnald Ramp at L3-4, L foraminectomy on 11/19/2019, has been resting for 6 weeks, has been released to routine activities, has mild R radicular nerve pain residual, though significantly improved and expects this to gradually improve over the next 6 months. Takes 1000 mg tylenol TID PRN.   Patient is on an ADD medication, he states that the medication is helping and he denies any adverse reactions. Takes adderall 10 mg 2-3 days/week for focus.   He has insomnia and is prescribed ativan, uses 3-4 days/week as needed.   he has a diagnosis of GERD which is currently managed by nexium 40 mg every other day, fairled attempted taper with pepcid.  he reports symptoms is currently well controlled, and denies breakthrough reflux, burning in chest, hoarseness.   BMI is Body mass index is 24.28 kg/m., he has been working on diet, exercise is limited though plans to gradually resume this.  Wt Readings from Last 3 Encounters:  02/03/20 155 lb (70.3 kg)  12/30/19 154 lb 9.6 oz (70.1 kg)  10/27/19 155 lb 3.2 oz (70.4 kg)   His blood pressure has been controlled at home, today their BP is BP: 122/68  He does workout. He denies chest pain, shortness of breath, dizziness.   He is on cholesterol medication (atorvastatin 40 mg daily) and denies myalgias. His LDL cholesterol is at goal; triglycerides remain elevated. The cholesterol last visit was:   Lab Results  Component Value Date   CHOL 139 10/27/2019   HDL 56 10/27/2019   LDLCALC 56 10/27/2019  TRIG 207 (H) 10/27/2019   CHOLHDL 2.5 10/27/2019    He has been working on diet and exercise for glucose management, and denies foot ulcerations, nausea, polydipsia, polyuria, visual disturbances, vomiting and weight loss. Last A1C in  the office was:  Lab Results  Component Value Date   HGBA1C 4.7 10/27/2019    He has CKD IIIa monitored at this office and follows annually with Dr. Ronalee Belts:  Lab Results  Component Value Date   GFRNONAA 50 (L) 10/27/2019   Patient is on Vitamin D supplement slightly high at the last visit, he was taking 10,000 every day:   Lab Results  Component Value Date   VD25OH 95 10/27/2019      Current Medications:  Current Outpatient Medications on File Prior to Visit  Medication Sig  . acetaminophen (TYLENOL) 500 MG tablet Take 500 mg by mouth every 6 (six) hours as needed.   Marland Kitchen amphetamine-dextroamphetamine (ADDERALL) 20 MG tablet   . atenolol (TENORMIN) 100 MG tablet Take 1 tablet daily for blood pressure.  Marland Kitchen atorvastatin (LIPITOR) 80 MG tablet Take 1 tablet Daily for Cholesterol  . Cholecalciferol (VITAMIN D PO) Take 10,000 Units by mouth daily.   Marland Kitchen esomeprazole (NEXIUM) 40 MG capsule TAKE ONE CAPSULE DAILY AT NOON FOR REFLUX. (Patient taking differently: Take one capsule every other day)  . finasteride (PROSCAR) 5 MG tablet Take 1 tablet (5 mg total) by mouth daily.  Marland Kitchen LORazepam (ATIVAN) 1 MG tablet Take 1/2 to 1 tablet at Bedtime as needed for Sleep  . losartan-hydrochlorothiazide (HYZAAR) 100-25 MG tablet Take 1 tablet Daily for BP  . valACYclovir (VALTREX) 500 MG tablet TAKE 1 TABLET BY MOUTH THREE TIMES DAILY FOR FEVER BLISTERS (Patient taking differently: as needed. )  . Zinc 50 MG CAPS Take by mouth daily.   No current facility-administered medications on file prior to visit.     Allergies:  Allergies  Allergen Reactions  . Nsaids     Renal Insufficiency     Medical History:  Past Medical History:  Diagnosis Date  . Arthritis    right thumb  . 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 Dry eyes    Takes Restasis  . GERD (gastroesophageal reflux disease)   . Hyperlipidemia   . Hypertension   . Vitamin D deficiency    Family history- Reviewed  and unchanged Social history- Reviewed and unchanged   Review of Systems:  Review of Systems  Constitutional: Negative for malaise/fatigue and weight loss.  HENT: Negative for hearing loss and tinnitus.   Eyes: Negative for blurred vision and double vision.  Respiratory: Negative for cough, shortness of breath and wheezing.   Cardiovascular: Negative for chest pain, palpitations, orthopnea, claudication and leg swelling.  Gastrointestinal: Negative for abdominal pain, blood in stool, constipation, diarrhea, heartburn, melena, nausea and vomiting.  Genitourinary: Negative.   Musculoskeletal: Positive for back pain. Negative for joint pain and myalgias.  Skin: Negative for rash.  Neurological: Positive for tingling (R lower leg). Negative for dizziness, sensory change, focal weakness, weakness and headaches.  Endo/Heme/Allergies: Negative for polydipsia.  Psychiatric/Behavioral: Negative.   All other systems reviewed and are negative.   Physical Exam: BP 122/68   Pulse 61   Temp (!) 97.3 F (36.3 C)   Ht 5\' 7"  (1.702 m)   Wt 155 lb (70.3 kg)   SpO2 99%   BMI 24.28 kg/m  Wt Readings from Last 3 Encounters:  02/03/20 155 lb (70.3 kg)  12/30/19 154  lb 9.6 oz (70.1 kg)  10/27/19 155 lb 3.2 oz (70.4 kg)   General Appearance: Well nourished, in no apparent distress. Eyes: PERRLA, EOMs, conjunctiva no swelling or erythema Sinuses: No Frontal/maxillary tenderness ENT/Mouth: Ext aud canals clear, TMs without erythema, bulging. No erythema, swelling, or exudate on post pharynx.  Tonsils not swollen or erythematous. Hearing normal.  Neck: Supple, thyroid normal.  Respiratory: Respiratory effort normal, BS equal bilaterally without rales, rhonchi, wheezing or stridor.  Cardio: RRR with no MRGs. Brisk peripheral pulses without edema.  Abdomen: Soft, + BS.  Non tender, no guarding, rebound, hernias, masses. Lymphatics: Non tender without lymphadenopathy.  Musculoskeletal: Full ROM (pain  with ROM in lumbar), 5/5 strength, Normal gait Skin: Warm, dry without rashes, lesions, ecchymosis.  Neuro: Cranial nerves intact. No cerebellar symptoms.  Psych: Awake and oriented X 3, normal affect, Insight and Judgment appropriate.    Dan Maker, NP 3:31 PM [facility]

## 2020-02-03 ENCOUNTER — Encounter: Payer: Self-pay | Admitting: Adult Health

## 2020-02-03 ENCOUNTER — Ambulatory Visit (INDEPENDENT_AMBULATORY_CARE_PROVIDER_SITE_OTHER): Payer: PPO | Admitting: Adult Health

## 2020-02-03 ENCOUNTER — Other Ambulatory Visit: Payer: Self-pay

## 2020-02-03 VITALS — BP 122/68 | HR 61 | Temp 97.3°F | Ht 67.0 in | Wt 155.0 lb

## 2020-02-03 DIAGNOSIS — E785 Hyperlipidemia, unspecified: Secondary | ICD-10-CM | POA: Diagnosis not present

## 2020-02-03 DIAGNOSIS — M51379 Other intervertebral disc degeneration, lumbosacral region without mention of lumbar back pain or lower extremity pain: Secondary | ICD-10-CM

## 2020-02-03 DIAGNOSIS — K219 Gastro-esophageal reflux disease without esophagitis: Secondary | ICD-10-CM

## 2020-02-03 DIAGNOSIS — Z79899 Other long term (current) drug therapy: Secondary | ICD-10-CM | POA: Diagnosis not present

## 2020-02-03 DIAGNOSIS — G4700 Insomnia, unspecified: Secondary | ICD-10-CM

## 2020-02-03 DIAGNOSIS — M5137 Other intervertebral disc degeneration, lumbosacral region: Secondary | ICD-10-CM | POA: Diagnosis not present

## 2020-02-03 DIAGNOSIS — F988 Other specified behavioral and emotional disorders with onset usually occurring in childhood and adolescence: Secondary | ICD-10-CM

## 2020-02-03 DIAGNOSIS — N1831 Chronic kidney disease, stage 3a: Secondary | ICD-10-CM | POA: Diagnosis not present

## 2020-02-03 DIAGNOSIS — R7309 Other abnormal glucose: Secondary | ICD-10-CM | POA: Diagnosis not present

## 2020-02-03 DIAGNOSIS — I10 Essential (primary) hypertension: Secondary | ICD-10-CM | POA: Diagnosis not present

## 2020-02-03 DIAGNOSIS — E559 Vitamin D deficiency, unspecified: Secondary | ICD-10-CM

## 2020-02-03 NOTE — Patient Instructions (Addendum)
Goals    . Exercise 150 min/wk Moderate Activity     Gradually introduce gentle exercise, walking or water aerobics, etc    . LDL CALC < 100        Exercising to Stay Healthy To become healthy and stay healthy, it is recommended that you do moderate-intensity and vigorous-intensity exercise. You can tell that you are exercising at a moderate intensity if your heart starts beating faster and you start breathing faster but can still hold a conversation. You can tell that you are exercising at a vigorous intensity if you are breathing much harder and faster and cannot hold a conversation while exercising. Exercising regularly is important. It has many health benefits, such as:  Improving overall fitness, flexibility, and endurance.  Increasing bone density.  Helping with weight control.  Decreasing body fat.  Increasing muscle strength.  Reducing stress and tension.  Improving overall health. How often should I exercise? Choose an activity that you enjoy, and set realistic goals. Your health care provider can help you make an activity plan that works for you. Exercise regularly as told by your health care provider. This may include:  Doing strength training two times a week, such as: ? Lifting weights. ? Using resistance bands. ? Push-ups. ? Sit-ups. ? Yoga.  Doing a certain intensity of exercise for a given amount of time. Choose from these options: ? A total of 150 minutes of moderate-intensity exercise every week. ? A total of 75 minutes of vigorous-intensity exercise every week. ? A mix of moderate-intensity and vigorous-intensity exercise every week. Children, pregnant women, people who have not exercised regularly, people who are overweight, and older adults may need to talk with a health care provider about what activities are safe to do. If you have a medical condition, be sure to talk with your health care provider before you start a new exercise program. What are some  exercise ideas? Moderate-intensity exercise ideas include:  Walking 1 mile (1.6 km) in about 15 minutes.  Biking.  Hiking.  Golfing.  Dancing.  Water aerobics. Vigorous-intensity exercise ideas include:  Walking 4.5 miles (7.2 km) or more in about 1 hour.  Jogging or running 5 miles (8 km) in about 1 hour.  Biking 10 miles (16.1 km) or more in about 1 hour.  Lap swimming.  Roller-skating or in-line skating.  Cross-country skiing.  Vigorous competitive sports, such as football, basketball, and soccer.  Jumping rope.  Aerobic dancing. What are some everyday activities that can help me to get exercise?  Yard work, such as: ? Pushing a Surveyor, mining. ? Raking and bagging leaves.  Washing your car.  Pushing a stroller.  Shoveling snow.  Gardening.  Washing windows or floors. How can I be more active in my day-to-day activities?  Use stairs instead of an elevator.  Take a walk during your lunch break.  If you drive, park your car farther away from your work or school.  If you take public transportation, get off one stop early and walk the rest of the way.  Stand up or walk around during all of your indoor phone calls.  Get up, stretch, and walk around every 30 minutes throughout the day.  Enjoy exercise with a friend. Support to continue exercising will help you keep a regular routine of activity. What guidelines can I follow while exercising?  Before you start a new exercise program, talk with your health care provider.  Do not exercise so much that you hurt yourself,  feel dizzy, or get very short of breath.  Wear comfortable clothes and wear shoes with good support.  Drink plenty of water while you exercise to prevent dehydration or heat stroke.  Work out until your breathing and your heartbeat get faster. Where to find more information  U.S. Department of Health and Human Services: [employer]at  Centers for Disease Control and Prevention (CDC):  http://www.wolf.info/ Summary  Exercising regularly is important. It will improve your overall fitness, flexibility, and endurance.  Regular exercise also will improve your overall health. It can help you control your weight, reduce stress, and improve your bone density.  Do not exercise so much that you hurt yourself, feel dizzy, or get very short of breath.  Before you start a new exercise program, talk with your health care provider. This information is not intended to replace advice given to you by your health care provider. Make sure you discuss any questions you have with your health care provider. Document Revised: 11/07/2017 Document Reviewed: 10/16/2017 Elsevier Patient Education  2020 [employer].

## 2020-02-04 LAB — CBC WITH DIFFERENTIAL/PLATELET
Absolute Monocytes: 1024 cells/uL — ABNORMAL HIGH (ref 200–950)
Basophils Absolute: 53 cells/uL (ref 0–200)
Basophils Relative: 0.6 %
Eosinophils Absolute: 169 cells/uL (ref 15–500)
Eosinophils Relative: 1.9 %
HCT: 42.8 % (ref 38.5–50.0)
Hemoglobin: 15 g/dL (ref 13.2–17.1)
Lymphs Abs: 1593 cells/uL (ref 850–3900)
MCH: 35 pg — ABNORMAL HIGH (ref 27.0–33.0)
MCHC: 35 g/dL (ref 32.0–36.0)
MCV: 100 fL (ref 80.0–100.0)
MPV: 10.5 fL (ref 7.5–12.5)
Monocytes Relative: 11.5 %
Neutro Abs: 6061 cells/uL (ref 1500–7800)
Neutrophils Relative %: 68.1 %
Platelets: 260 10*3/uL (ref 140–400)
RBC: 4.28 10*6/uL (ref 4.20–5.80)
RDW: 11.9 % (ref 11.0–15.0)
Total Lymphocyte: 17.9 %
WBC: 8.9 10*3/uL (ref 3.8–10.8)

## 2020-02-04 LAB — COMPLETE METABOLIC PANEL WITH GFR
AG Ratio: 2 (calc) (ref 1.0–2.5)
ALT: 30 U/L (ref 9–46)
AST: 29 U/L (ref 10–35)
Albumin: 4.5 g/dL (ref 3.6–5.1)
Alkaline phosphatase (APISO): 70 U/L (ref 35–144)
BUN/Creatinine Ratio: 15 (calc) (ref 6–22)
BUN: 25 mg/dL (ref 7–25)
CO2: 28 mmol/L (ref 20–32)
Calcium: 10.6 mg/dL — ABNORMAL HIGH (ref 8.6–10.3)
Chloride: 103 mmol/L (ref 98–110)
Creat: 1.62 mg/dL — ABNORMAL HIGH (ref 0.70–1.25)
GFR, Est African American: 50 mL/min/{1.73_m2} — ABNORMAL LOW (ref >=60)
GFR, Est Non African American: 43 mL/min/{1.73_m2} — ABNORMAL LOW (ref >=60)
Globulin: 2.2 g/dL (calc) (ref 1.9–3.7)
Glucose, Bld: 100 mg/dL — ABNORMAL HIGH (ref 65–99)
Potassium: 4.8 mmol/L (ref 3.5–5.3)
Sodium: 139 mmol/L (ref 135–146)
Total Bilirubin: 0.7 mg/dL (ref 0.2–1.2)
Total Protein: 6.7 g/dL (ref 6.1–8.1)

## 2020-02-04 LAB — LIPID PANEL
Cholesterol: 138 mg/dL (ref <200)
HDL: 45 mg/dL (ref >=40)
LDL Cholesterol (Calc): 61 mg/dL (calc)
Non-HDL Cholesterol (Calc): 93 mg/dL (calc) (ref <130)
Total CHOL/HDL Ratio: 3.1 (calc) (ref <5.0)
Triglycerides: 306 mg/dL — ABNORMAL HIGH (ref <150)

## 2020-02-04 LAB — TSH: TSH: 1.06 mIU/L (ref 0.40–4.50)

## 2020-02-04 LAB — MAGNESIUM: Magnesium: 1.8 mg/dL (ref 1.5–2.5)

## 2020-02-06 ENCOUNTER — Ambulatory Visit: Payer: BLUE CROSS/BLUE SHIELD

## 2020-03-01 DIAGNOSIS — Z961 Presence of intraocular lens: Secondary | ICD-10-CM | POA: Diagnosis not present

## 2020-03-01 DIAGNOSIS — H35033 Hypertensive retinopathy, bilateral: Secondary | ICD-10-CM | POA: Diagnosis not present

## 2020-03-01 DIAGNOSIS — H2181 Floppy iris syndrome: Secondary | ICD-10-CM | POA: Diagnosis not present

## 2020-03-01 DIAGNOSIS — H04123 Dry eye syndrome of bilateral lacrimal glands: Secondary | ICD-10-CM | POA: Diagnosis not present

## 2020-03-03 DIAGNOSIS — M79672 Pain in left foot: Secondary | ICD-10-CM | POA: Diagnosis not present

## 2020-03-03 DIAGNOSIS — M25572 Pain in left ankle and joints of left foot: Secondary | ICD-10-CM | POA: Diagnosis not present

## 2020-03-03 DIAGNOSIS — M7752 Other enthesopathy of left foot: Secondary | ICD-10-CM | POA: Diagnosis not present

## 2020-03-03 DIAGNOSIS — M2022 Hallux rigidus, left foot: Secondary | ICD-10-CM | POA: Diagnosis not present

## 2020-04-04 DIAGNOSIS — M461 Sacroiliitis, not elsewhere classified: Secondary | ICD-10-CM | POA: Diagnosis not present

## 2020-04-04 DIAGNOSIS — M5126 Other intervertebral disc displacement, lumbar region: Secondary | ICD-10-CM | POA: Diagnosis not present

## 2020-04-04 DIAGNOSIS — M5416 Radiculopathy, lumbar region: Secondary | ICD-10-CM | POA: Diagnosis not present

## 2020-04-04 DIAGNOSIS — M791 Myalgia, unspecified site: Secondary | ICD-10-CM | POA: Diagnosis not present

## 2020-04-05 ENCOUNTER — Other Ambulatory Visit: Payer: Self-pay | Admitting: Internal Medicine

## 2020-04-05 ENCOUNTER — Ambulatory Visit: Payer: PPO | Admitting: Physician Assistant

## 2020-04-05 DIAGNOSIS — I10 Essential (primary) hypertension: Secondary | ICD-10-CM

## 2020-04-05 MED ORDER — ATENOLOL 100 MG PO TABS: ORAL_TABLET | ORAL | 1 refills | Status: DC

## 2020-05-02 ENCOUNTER — Encounter: Payer: Self-pay | Admitting: Internal Medicine

## 2020-05-02 NOTE — Progress Notes (Signed)
History of Present Illness:       This very nice 69 y.o. MWM presents for 6 month follow up with HTN, HLD, Pre-Diabetes and Vitamin D Deficiency.  Patient has GERD controlled on his meds. He  has ADD treated w/Adderall with improved focus & concentration.      Patient is treated for HTN (1988) & BP has been controlled at home. Today's BP at goal - 116/80. Patient has had no complaints of any cardiac type chest pain, palpitations, dyspnea / orthopnea / PND, dizziness, claudication, or dependent edema.      Hyperlipidemia is controlled with diet & Atorvastatin. Patient denies myalgias or other med SE's. Last Lipids were at  goal except elevated Trig's:  Lab Results  Component Value Date   CHOL 138 02/03/2020   HDL 45 02/03/2020   LDLCALC 61 02/03/2020   TRIG 306 (H) 02/03/2020   CHOLHDL 3.1 02/03/2020    Also, the patient has history of PreDiabetes w/Insulin Resistance in 2016 & 2018 and has had no symptoms of reactive hypoglycemia, diabetic polys, paresthesias or visual blurring.  Last A1c was Normal & at goal:  Lab Results  Component Value Date   HGBA1C 4.7 10/27/2019       Further, the patient also has history of Vitamin D Deficiency (28" / 2008)  and supplements vitamin D without any suspected side-effects. Last vitamin D was at goal:  Lab Results  Component Value Date   VD25OH 95 10/27/2019    Current Outpatient Medications on File Prior to Visit  Medication Sig  . acetaminophen (TYLENOL) 500 MG tablet Take 500 mg by mouth every 6 (six) hours as needed.   Marland Kitchen amphetamine-dextroamphetamine (ADDERALL) 20 MG tablet   . atenolol (TENORMIN) 100 MG tablet Take 1 tablet daily for blood pressure.  Marland Kitchen atorvastatin (LIPITOR) 80 MG tablet Take 1 tablet Daily for Cholesterol  . Cholecalciferol (VITAMIN D PO) Take 10,000 Units by mouth daily.   Marland Kitchen esomeprazole (NEXIUM) 40 MG capsule TAKE ONE CAPSULE DAILY AT NOON FOR REFLUX. (Patient taking differently: Take one capsule every other  day)  . finasteride (PROSCAR) 5 MG tablet Take 1 tablet (5 mg total) by mouth daily.  Marland Kitchen LORazepam (ATIVAN) 1 MG tablet Take 1/2 to 1 tablet at Bedtime as needed for Sleep  . losartan-hydrochlorothiazide (HYZAAR) 100-25 MG tablet Take 1 tablet Daily for BP  . valACYclovir (VALTREX) 500 MG tablet TAKE 1 TABLET BY MOUTH THREE TIMES DAILY FOR FEVER BLISTERS (Patient taking differently: as needed. )   No current facility-administered medications on file prior to visit.    Allergies  Allergen Reactions  . Nsaids     Renal Insufficiency    PMHx:   Past Medical History:  Diagnosis Date  . Arthritis    right thumb  . 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 Dry eyes    Takes Restasis  . GERD (gastroesophageal reflux disease)   . Hyperlipidemia   . Hypertension   . Vitamin D deficiency     Immunization History  Administered Date(s) Administered  . Influenza Split 09/22/2013  . Influenza, High Dose Seasonal PF 10/22/2019  . Influenza-Unspecified 09/08/2017, 08/19/2018  . PPD Test 09/13/2014, 07/25/2015, 08/08/2016, 09/08/2017, 10/09/2018  . Pneumococcal Conjugate-13 01/22/2018, 10/22/2019  . Pneumococcal Polysaccharide-23 01/19/2009  . Td 07/22/2019  . Tdap 01/19/2009  . Zoster 01/29/2013    Past Surgical History:  Procedure Laterality Date  . BACK SURGERY  11/19/2019   L3-4 R  foraminectomy, Dr. Ronnald Ramp  . COLONOSCOPY W/ POLYPECTOMY    . ESOPHAGOGASTRODUODENOSCOPY  1990  . TOTAL HIP ARTHROPLASTY Left 10/28/2014   Procedure: TOTAL HIP ARTHROPLASTY ANTERIOR APPROACH;  Surgeon: Marybelle Killings, MD;  Location: [facility];  Service: Orthopedics;  Laterality: Left;    FHx:    Reviewed / unchanged  SHx:    Reviewed / unchanged   Systems Review:  Constitutional: Denies fever, chills, wt changes, headaches, insomnia, fatigue, night sweats, change in appetite. Eyes: Denies redness, blurred vision, diplopia, discharge, itchy, watery eyes.  ENT: Denies discharge,  congestion, post nasal drip, epistaxis, sore throat, earache, hearing loss, dental pain, tinnitus, vertigo, sinus pain, snoring.  CV: Denies chest pain, palpitations, irregular heartbeat, syncope, dyspnea, diaphoresis, orthopnea, PND, claudication or edema. Respiratory: denies cough, dyspnea, DOE, pleurisy, hoarseness, laryngitis, wheezing.  Gastrointestinal: Denies dysphagia, odynophagia, heartburn, reflux, water brash, abdominal pain or cramps, nausea, vomiting, bloating, diarrhea, constipation, hematemesis, melena, hematochezia  or hemorrhoids. Genitourinary: Denies dysuria, frequency, urgency, nocturia, hesitancy, discharge, hematuria or flank pain. Musculoskeletal: Denies arthralgias, myalgias, stiffness, jt. swelling, pain, limping or strain/sprain.  Skin: Denies pruritus, rash, hives, warts, acne, eczema or change in skin lesion(s). Neuro: No weakness, tremor, incoordination, spasms, paresthesia or pain. Psychiatric: Denies confusion, memory loss or sensory loss. Endo: Denies change in weight, skin or hair change.  Heme/Lymph: No excessive bleeding, bruising or enlarged lymph nodes.  Physical Exam  BP 116/80   Pulse 64   Temp (!) 97 F (36.1 C)   Resp 16   Ht 5\' 7"  (1.702 m)   Wt 155 lb 6.4 oz (70.5 kg)   BMI 24.34 kg/m   Appears  well nourished, well groomed  and in no distress.  Eyes: PERRLA, EOMs, conjunctiva no swelling or erythema. Sinuses: No frontal/maxillary tenderness ENT/Mouth: EAC's clear, TM's nl w/o erythema, bulging. Nares clear w/o erythema, swelling, exudates. Oropharynx clear without erythema or exudates. Oral hygiene is good. Tongue normal, non obstructing. Hearing intact.  Neck: Supple. Thyroid not palpable. Car 2+/2+ without bruits, nodes or JVD. Chest: Respirations nl with BS clear & equal w/o rales, rhonchi, wheezing or stridor.  Cor: Heart sounds normal w/ regular rate and rhythm without sig. murmurs, gallops, clicks or rubs. Peripheral pulses normal and  equal  without edema.  Abdomen: Soft & bowel sounds normal. Non-tender w/o guarding, rebound, hernias, masses or organomegaly.  Lymphatics: Unremarkable.  Musculoskeletal: Full ROM all peripheral extremities, joint stability, 5/5 strength and normal gait.  Skin: Warm, dry without exposed rashes, lesions or ecchymosis apparent.  Neuro: Cranial nerves intact, reflexes equal bilaterally. Sensory-motor testing grossly intact. Tendon reflexes grossly intact.  Pysch: Alert & oriented x 3.  Insight and judgement nl & appropriate. No ideations.  Assessment and Plan:  1. Essential hypertension  - Continue medication, monitor blood pressure at home.  - Continue DASH diet.  Reminder to go to the ER if any CP,  SOB, nausea, dizziness, severe HA, changes vision/speech.  - CBC with Differential/Platelet - COMPLETE METABOLIC PANEL WITH GFR - Magnesium - TSH  2. Hyperlipidemia, mixed  - Continue diet/meds, exercise,& lifestyle modifications.  - Continue monitor periodic cholesterol/liver & renal functions   - Lipid panel - TSH  3. Abnormal glucose  - Continue diet, exercise  - Lifestyle modifications.  - Monitor appropriate labs.  - Hemoglobin A1c - Insulin, random  4. Vitamin D deficiency  - Continue supplementation.  - VITAMIN D 25 Hydroxy   5. Prediabetes  - Hemoglobin A1c - Insulin, random  6. Medication  management  - CBC with Differential/Platelet - COMPLETE METABOLIC PANEL WITH GFR - Magnesium - Lipid panel - TSH - Hemoglobin A1c - Insulin, random - VITAMIN D 25 Hydroxy         Discussed  regular exercise, BP monitoring, weight control to achieve/maintain BMI less than 25 and discussed med and SE's. Recommended labs to assess and monitor clinical status with further disposition pending results of labs.  I discussed the assessment and treatment plan with the patient. The patient was provided an opportunity to ask questions and all were answered. The patient agreed  with the plan and demonstrated an understanding of the instructions.  I provided over 30 minutes of exam, counseling, chart review and  complex critical decision making.         The patient was advised to call back or seek an in-person evaluation if the symptoms worsen or if the condition fails to improve as anticipated.   Marinus Maw, MD

## 2020-05-02 NOTE — Patient Instructions (Signed)

## 2020-05-03 ENCOUNTER — Other Ambulatory Visit: Payer: Self-pay

## 2020-05-03 ENCOUNTER — Ambulatory Visit (INDEPENDENT_AMBULATORY_CARE_PROVIDER_SITE_OTHER): Payer: PPO | Admitting: Internal Medicine

## 2020-05-03 VITALS — BP 116/80 | HR 64 | Temp 97.0°F | Resp 16 | Ht 67.0 in | Wt 155.4 lb

## 2020-05-03 DIAGNOSIS — G8929 Other chronic pain: Secondary | ICD-10-CM

## 2020-05-03 DIAGNOSIS — R7303 Prediabetes: Secondary | ICD-10-CM

## 2020-05-03 DIAGNOSIS — E559 Vitamin D deficiency, unspecified: Secondary | ICD-10-CM

## 2020-05-03 DIAGNOSIS — R7309 Other abnormal glucose: Secondary | ICD-10-CM | POA: Diagnosis not present

## 2020-05-03 DIAGNOSIS — Z79899 Other long term (current) drug therapy: Secondary | ICD-10-CM

## 2020-05-03 DIAGNOSIS — M5440 Lumbago with sciatica, unspecified side: Secondary | ICD-10-CM | POA: Diagnosis not present

## 2020-05-03 DIAGNOSIS — E782 Mixed hyperlipidemia: Secondary | ICD-10-CM

## 2020-05-03 DIAGNOSIS — I10 Essential (primary) hypertension: Secondary | ICD-10-CM

## 2020-05-03 MED ORDER — DEXAMETHASONE 4 MG PO TABS: ORAL_TABLET | ORAL | 1 refills | Status: DC

## 2020-05-04 LAB — CBC WITH DIFFERENTIAL/PLATELET
Absolute Monocytes: 998 cells/uL — ABNORMAL HIGH (ref 200–950)
Basophils Absolute: 60 cells/uL (ref 0–200)
Basophils Relative: 0.7 %
Eosinophils Absolute: 43 cells/uL (ref 15–500)
Eosinophils Relative: 0.5 %
HCT: 41.6 % (ref 38.5–50.0)
Hemoglobin: 14.4 g/dL (ref 13.2–17.1)
Lymphs Abs: 1144 cells/uL (ref 850–3900)
MCH: 35.1 pg — ABNORMAL HIGH (ref 27.0–33.0)
MCHC: 34.6 g/dL (ref 32.0–36.0)
MCV: 101.5 fL — ABNORMAL HIGH (ref 80.0–100.0)
MPV: 10 fL (ref 7.5–12.5)
Monocytes Relative: 11.6 %
Neutro Abs: 6355 cells/uL (ref 1500–7800)
Neutrophils Relative %: 73.9 %
Platelets: 278 10*3/uL (ref 140–400)
RBC: 4.1 10*6/uL — ABNORMAL LOW (ref 4.20–5.80)
RDW: 12.3 % (ref 11.0–15.0)
Total Lymphocyte: 13.3 %
WBC: 8.6 10*3/uL (ref 3.8–10.8)

## 2020-05-04 LAB — HEMOGLOBIN A1C
Hgb A1c MFr Bld: 4.9 % of total Hgb (ref <5.7)
Mean Plasma Glucose: 94 (calc)
eAG (mmol/L): 5.2 (calc)

## 2020-05-04 LAB — COMPLETE METABOLIC PANEL WITH GFR
AG Ratio: 2.3 (calc) (ref 1.0–2.5)
ALT: 24 U/L (ref 9–46)
AST: 22 U/L (ref 10–35)
Albumin: 4.2 g/dL (ref 3.6–5.1)
Alkaline phosphatase (APISO): 53 U/L (ref 35–144)
BUN/Creatinine Ratio: 18 (calc) (ref 6–22)
BUN: 29 mg/dL — ABNORMAL HIGH (ref 7–25)
CO2: 28 mmol/L (ref 20–32)
Calcium: 9.2 mg/dL (ref 8.6–10.3)
Chloride: 105 mmol/L (ref 98–110)
Creat: 1.58 mg/dL — ABNORMAL HIGH (ref 0.70–1.25)
GFR, Est African American: 52 mL/min/{1.73_m2} — ABNORMAL LOW (ref >=60)
GFR, Est Non African American: 45 mL/min/{1.73_m2} — ABNORMAL LOW (ref >=60)
Globulin: 1.8 g/dL (calc) — ABNORMAL LOW (ref 1.9–3.7)
Glucose, Bld: 93 mg/dL (ref 65–99)
Potassium: 4.2 mmol/L (ref 3.5–5.3)
Sodium: 140 mmol/L (ref 135–146)
Total Bilirubin: 0.6 mg/dL (ref 0.2–1.2)
Total Protein: 6 g/dL — ABNORMAL LOW (ref 6.1–8.1)

## 2020-05-04 LAB — LIPID PANEL
Cholesterol: 172 mg/dL (ref <200)
HDL: 67 mg/dL (ref >=40)
LDL Cholesterol (Calc): 76 mg/dL (calc)
Non-HDL Cholesterol (Calc): 105 mg/dL (calc) (ref <130)
Total CHOL/HDL Ratio: 2.6 (calc) (ref <5.0)
Triglycerides: 203 mg/dL — ABNORMAL HIGH (ref <150)

## 2020-05-04 LAB — INSULIN, RANDOM: Insulin: 32.7 u[IU]/mL — ABNORMAL HIGH

## 2020-05-04 LAB — TSH: TSH: 1.1 mIU/L (ref 0.40–4.50)

## 2020-05-04 LAB — VITAMIN D 25 HYDROXY (VIT D DEFICIENCY, FRACTURES): Vit D, 25-Hydroxy: 95 ng/mL (ref 30–100)

## 2020-05-04 LAB — MAGNESIUM: Magnesium: 1.7 mg/dL (ref 1.5–2.5)

## 2020-06-24 ENCOUNTER — Other Ambulatory Visit: Payer: Self-pay | Admitting: Internal Medicine

## 2020-06-24 DIAGNOSIS — G4700 Insomnia, unspecified: Secondary | ICD-10-CM

## 2020-06-24 MED ORDER — LORAZEPAM 1 MG PO TABS: ORAL_TABLET | ORAL | 0 refills | Status: DC

## 2020-07-03 ENCOUNTER — Other Ambulatory Visit: Payer: Self-pay | Admitting: Anesthesiology

## 2020-07-03 DIAGNOSIS — M461 Sacroiliitis, not elsewhere classified: Secondary | ICD-10-CM | POA: Diagnosis not present

## 2020-07-03 DIAGNOSIS — M961 Postlaminectomy syndrome, not elsewhere classified: Secondary | ICD-10-CM | POA: Diagnosis not present

## 2020-07-03 DIAGNOSIS — G894 Chronic pain syndrome: Secondary | ICD-10-CM | POA: Diagnosis not present

## 2020-07-03 DIAGNOSIS — M545 Low back pain, unspecified: Secondary | ICD-10-CM

## 2020-07-03 DIAGNOSIS — M5416 Radiculopathy, lumbar region: Secondary | ICD-10-CM | POA: Diagnosis not present

## 2020-07-03 DIAGNOSIS — M4125 Other idiopathic scoliosis, thoracolumbar region: Secondary | ICD-10-CM | POA: Diagnosis not present

## 2020-07-03 DIAGNOSIS — M791 Myalgia, unspecified site: Secondary | ICD-10-CM | POA: Diagnosis not present

## 2020-07-03 DIAGNOSIS — M47817 Spondylosis without myelopathy or radiculopathy, lumbosacral region: Secondary | ICD-10-CM | POA: Diagnosis not present

## 2020-07-18 ENCOUNTER — Other Ambulatory Visit: Payer: Self-pay

## 2020-07-18 ENCOUNTER — Ambulatory Visit
Admission: RE | Admit: 2020-07-18 | Discharge: 2020-07-18 | Disposition: A | Discharge status: Home / Self Care | Payer: PPO | Source: Ambulatory Visit | Attending: Anesthesiology | Admitting: Anesthesiology

## 2020-07-18 DIAGNOSIS — M47816 Spondylosis without myelopathy or radiculopathy, lumbar region: Secondary | ICD-10-CM | POA: Diagnosis not present

## 2020-07-18 DIAGNOSIS — M4856XA Collapsed vertebra, not elsewhere classified, lumbar region, initial encounter for fracture: Secondary | ICD-10-CM | POA: Diagnosis not present

## 2020-07-18 DIAGNOSIS — M545 Low back pain, unspecified: Secondary | ICD-10-CM

## 2020-07-18 DIAGNOSIS — M48061 Spinal stenosis, lumbar region without neurogenic claudication: Secondary | ICD-10-CM | POA: Diagnosis not present

## 2020-07-18 DIAGNOSIS — M5126 Other intervertebral disc displacement, lumbar region: Secondary | ICD-10-CM | POA: Diagnosis not present

## 2020-07-18 MED ORDER — GADOBENATE DIMEGLUMINE 529 MG/ML IV SOLN
14.0000 mL | Freq: Once | INTRAVENOUS | Status: AC | PRN
  Administered 2020-07-18: 14 mL via INTRAVENOUS

## 2020-07-26 DIAGNOSIS — M47817 Spondylosis without myelopathy or radiculopathy, lumbosacral region: Secondary | ICD-10-CM | POA: Diagnosis not present

## 2020-07-26 DIAGNOSIS — M4125 Other idiopathic scoliosis, thoracolumbar region: Secondary | ICD-10-CM | POA: Diagnosis not present

## 2020-07-26 DIAGNOSIS — S32009S Unspecified fracture of unspecified lumbar vertebra, sequela: Secondary | ICD-10-CM | POA: Diagnosis not present

## 2020-07-26 DIAGNOSIS — M5416 Radiculopathy, lumbar region: Secondary | ICD-10-CM | POA: Diagnosis not present

## 2020-07-27 ENCOUNTER — Ambulatory Visit (INDEPENDENT_AMBULATORY_CARE_PROVIDER_SITE_OTHER): Payer: PPO | Admitting: Internal Medicine

## 2020-07-27 ENCOUNTER — Other Ambulatory Visit: Payer: Self-pay

## 2020-07-27 VITALS — BP 122/80 | HR 64 | Temp 97.0°F | Resp 16 | Ht 67.0 in | Wt 154.2 lb

## 2020-07-27 DIAGNOSIS — R5383 Other fatigue: Secondary | ICD-10-CM

## 2020-07-27 DIAGNOSIS — Z79899 Other long term (current) drug therapy: Secondary | ICD-10-CM | POA: Diagnosis not present

## 2020-07-27 DIAGNOSIS — I10 Essential (primary) hypertension: Secondary | ICD-10-CM | POA: Diagnosis not present

## 2020-07-27 DIAGNOSIS — E349 Endocrine disorder, unspecified: Secondary | ICD-10-CM | POA: Diagnosis not present

## 2020-07-27 DIAGNOSIS — M5440 Lumbago with sciatica, unspecified side: Secondary | ICD-10-CM | POA: Diagnosis not present

## 2020-07-27 DIAGNOSIS — S32021A Stable burst fracture of second lumbar vertebra, initial encounter for closed fracture: Secondary | ICD-10-CM

## 2020-07-27 LAB — COMPLETE METABOLIC PANEL WITH GFR
AG Ratio: 2.2 (calc) (ref 1.0–2.5)
ALT: 31 U/L (ref 9–46)
AST: 29 U/L (ref 10–35)
Albumin: 4.3 g/dL (ref 3.6–5.1)
Alkaline phosphatase (APISO): 110 U/L (ref 35–144)
BUN/Creatinine Ratio: 11 (calc) (ref 6–22)
BUN: 20 mg/dL (ref 7–25)
CO2: 28 mmol/L (ref 20–32)
Calcium: 9.6 mg/dL (ref 8.6–10.3)
Chloride: 102 mmol/L (ref 98–110)
Creat: 1.74 mg/dL — ABNORMAL HIGH (ref 0.70–1.25)
GFR, Est African American: 46 mL/min/{1.73_m2} — ABNORMAL LOW (ref >=60)
GFR, Est Non African American: 40 mL/min/{1.73_m2} — ABNORMAL LOW (ref >=60)
Globulin: 2 g/dL (calc) (ref 1.9–3.7)
Glucose, Bld: 104 mg/dL — ABNORMAL HIGH (ref 65–99)
Potassium: 4.3 mmol/L (ref 3.5–5.3)
Sodium: 139 mmol/L (ref 135–146)
Total Bilirubin: 0.9 mg/dL (ref 0.2–1.2)
Total Protein: 6.3 g/dL (ref 6.1–8.1)

## 2020-07-27 LAB — TESTOSTERONE: Testosterone: 517 ng/dL (ref 250–827)

## 2020-07-28 NOTE — Progress Notes (Addendum)
===========================================================  -  Kidney Functions Stable, Liver Enzymes & Electrolytes - Normal  ===========================================================  -  Calcium & Bone Enzymes ( Alk. Phosphatase) - Both Normal & OK  ===========================================================  -  Testosterone Level Normal & OK  ===========================================================

## 2020-07-29 ENCOUNTER — Encounter: Payer: Self-pay | Admitting: Internal Medicine

## 2020-07-29 NOTE — Addendum Note (Signed)
Addended by: Lucky Cowboy on: 07/29/2020 06:12 PM   Modules accepted: Level of Service

## 2020-07-29 NOTE — Progress Notes (Signed)
History of Present Illness:      This very nice 69 y.o. MWM  with HTN, HLD, Pre-Diabetes, GERD, Vitamin D Deficiency and chronic LBP since following off of a ladder in 2016 has failed trials on EDSI, dry needling, PT, Lyrica/Gabapentin has done reasonably well with trigger point injections in the past. In Dec 2020, he did undergo a laminectomy by Dr [name]. [name] [name] has been followed in Pain Mgmt by Dr [name] & recent Lumbar MRI by Dr [name] showed   Acute/early subacute L2 inferior endplate compression fracture with mild height loss.  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  Lumbar spondylosis has not significantly changed as compared to the MRI of 06/16/2019.   No more than mild spinal canal stenosis at anylevel.   At L4-L5, there is an unchanged small rightcenter/subarticular disc protrusion  which may contact the descendingright L5 nerve root.   Multilevel neural foraminal narrowing greatest bilaterally at L1-L2 (moderate right, severe left),  on the left at L3-L4 (moderate) and on the right at L5-S1        Patient presents today with concerns re: the compression fx wrt whether he may be testosterone deficient as a cause of osteoporosis predisposing compression fx's and also the acuteness of the compression fx.  He also relates ongoing sx's of fatigability albeit improved on Adderall.    Medications   Current Outpatient Medications (Cardiovascular):  .  atenolol (TENORMIN) 100 MG tablet, Take 1 tablet daily for blood pressure. Marland Kitchen  atorvastatin (LIPITOR) 80 MG tablet, Take 1 tablet Daily for Cholesterol .  losartan-hydrochlorothiazide (HYZAAR) 100-25 MG tablet, Take 1 tablet Daily for BP   Current Outpatient Medications (Analgesics):  .  acetaminophen (TYLENOL) 500 MG tablet, Take 500 mg by mouth every 6 (six) hours as needed.    Current Outpatient Medications (Other):  .  amphetamine-dextroamphetamine (ADDERALL)  20 MG tablet,  .  Cholecalciferol (VITAMIN D PO), Take 10,000 Units by mouth daily.  Marland Kitchen  esomeprazole (NEXIUM) 40 MG capsule, TAKE ONE CAPSULE DAILY AT NOON FOR REFLUX. (Patient taking differently: Take one capsule every other day) .  finasteride (PROSCAR) 5 MG tablet, Take 1 tablet (5 mg total) by mouth daily. Marland Kitchen  LORazepam (ATIVAN) 1 MG tablet, Take 1/2 to 1 tablet at Bedtime as needed for Sleep .  valACYclovir (VALTREX) 500 MG tablet, TAKE 1 TABLET BY MOUTH THREE TIMES DAILY FOR FEVER BLISTERS (Patient taking differently: as needed. )  Problem list He has Hypertension; Hyperlipidemia; GERD (gastroesophageal reflux disease); Vitamin D deficiency; Blood glucose abnormal; Medication management; Insomnia; Gout; DDD (degenerative disc disease), lumbosacral; CKD (chronic kidney disease) stage 3, GFR 30-59 ml/min; and ADD (attention deficit disorder) on their problem list.   Observations/Objective:   BP 122/80   Pulse 64   Temp (!) 97 F (36.1 C)   Resp 16   Ht 5\' 7"  (1.702 m)   Wt 154 lb 3.2 oz (69.9 kg)   BMI 24.15 kg/m   HEENT - WNL. Neck - supple.  Chest - Clear equal BS. Cor - Nl HS. RRR w/o sig MGR. PP 1(+). No edema. MS- FROM w/o deformities.  Gait Nl. Neuro -  Nl w/o focal abnormalities.  Assessment and Plan:   1. Acute bilateral low back pain with sciatica   2. Closed stable burst fracture of second lumbar vertebra(HCC)  - DG Bone Density  3. Testosterone deficiency  - COMPLETE METABOLIC PANEL WITH GFR to r/o HPT - Testosterone - DG  Bone Density; Future  4. Fatigue, unspecified type  - COMPLETE METABOLIC PANEL WITH GFR - Testosterone  5. Essential hypertension  - COMPLETE METABOLIC PANEL WITH GFR  6. Medication management  - COMPLETE METABOLIC PANEL WITH GFR - Testosterone   Follow Up Instructions:      I discussed the assessment and treatment plan with the patient. The patient was provided an opportunity to ask questions and all were answered. The  patient agreed with the plan and demonstrated an understanding of the instructions.    Marinus Maw, MD

## 2020-08-01 ENCOUNTER — Other Ambulatory Visit: Payer: PPO

## 2020-08-07 DIAGNOSIS — E559 Vitamin D deficiency, unspecified: Secondary | ICD-10-CM | POA: Diagnosis not present

## 2020-08-07 DIAGNOSIS — N1831 Chronic kidney disease, stage 3a: Secondary | ICD-10-CM | POA: Diagnosis not present

## 2020-08-07 DIAGNOSIS — I129 Hypertensive chronic kidney disease with stage 1 through stage 4 chronic kidney disease, or unspecified chronic kidney disease: Secondary | ICD-10-CM | POA: Diagnosis not present

## 2020-08-09 ENCOUNTER — Ambulatory Visit: Payer: PPO | Admitting: Physician Assistant

## 2020-08-17 DIAGNOSIS — S32020A Wedge compression fracture of second lumbar vertebra, initial encounter for closed fracture: Secondary | ICD-10-CM | POA: Diagnosis not present

## 2020-08-17 NOTE — Progress Notes (Signed)
FOLLOW UP  Assessment and Plan:   Hypertension Well controlled with current medications  Monitor blood pressure at home; patient to call if consistently greater than 130/80 Continue DASH diet.   Reminder to go to the ER if any CP, SOB, nausea, dizziness, severe HA, changes vision/speech, left arm numbness and tingling and jaw pain.  Cholesterol Currently at [facility]; continue statin  Continue low cholesterol diet and exercise.  Check lipid panel.   Other abnormal glucose Elevated glucose and insulin levels without A1C abnormalities Continue diet and exercise.  Perform daily foot/skin check, notify office of any concerning changes.  Defer A1C; check CMP  Vitamin D Def At goal at last visit; continue supplementation to maintain goal of 60-100 Defer Vit D level  Chronic lower back pain S/p surgery; follows with Dr. [name] Dr. [name] Encouraged gradual increase in activity level Currently with increased pain, oxycodone used sparingly per patient, would like to stop if possible; pending R nerve block, pending DEXA Discussed may increase tylenol up to 1000 mg 3-4 times per day   ADD Continue medications Helps with focus, no AE's. The patient was counseled on the addictive nature of the medication and was encouraged to take drug holidays when not needed.   GERD Well managed on current medications; failed PPI taper trial  Discussed diet, avoiding triggers and other lifestyle changes  Insomnia good sleep hygiene discussed, increase day time activity Ativan PRN, ideally need to avoid daily use, <5 days per week  Was doing well prior to back pain flare; see notes for back pain Discussed and will give trial of belsomra; risks/benefits discussed, information given on AVS  High risk medication use Patient is on opioids as well as benzos, due to new guidelines we discussed decreasing benzo or opioid use. New studies show that unintential overdose was most likely to occur with  concurrent benzo use, that opioids are better acute and short term pain management and long term recent studies show that patients on long term opioid use have worse outcomes and more complications than other medications. He expressed is hopful to taper off of oxycodone following nerve ablation; also discussed alternative options for insomnia management to avoid daily benzo use.    Continue diet and meds as discussed. Further disposition pending results of labs. Discussed med's effects and SE's.   Over 30 minutes of exam, counseling, chart review, and critical decision making was performed.   Future Appointments  Date Time Provider Department Center  11/23/2020 10:00 AM Lucky Cowboy, MD [facility] None    ----------------------------------------------------------------------------------------------------------------------  HPI 69 y.o. male  presents for 3 month follow up on hypertension, cholesterol, glucose management, weight monitoring and vitamin D deficiency.   Retired, has a place at [employer] at [facility] [employer]  Chronic back pain history; He had out patient back surgery by Dr. [name] at L3-4, L foraminectomy on 11/19/2019, has some mild residual sx, then in July had sudden worsening of pain. Now followed by pain management Dr. [name]. Recent lumbar MRI on 07/18/2020 showed acute/early subacute L2 inferior endplate compression fracture with mild height loss. 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  Discussing L5 nerve root block.  He is prescribed oxycodone 5 mg by Dr. [name], typically takes twice a week. Takes 2 extra strength tylenol daily. Has tried cymbalta and gabapentin but didn't tolerate well.   Lumbar spondylosis has not significantly changed as compared to the MRI of 06/16/2019. Dr. Oneta Rack ordered DEXA 8/21 which has not  yet been scheduled, will be doing through Jone's office, anticipating Dec/Jan.   Patient is on an  ADD medication, he states that the medication is helping and he denies any adverse reactions. Takes adderall 10 mg 2-3 days/week for focus.   He has insomnia and is prescribed ativan, uses 1/2 tab nightly since pain is worse, previous to surgery was able to limit to 3-4 days/week.   he has a diagnosis of GERD which is currently managed by nexium 40 mg every other day, fairled attempted taper with pepcid.  he reports symptoms is currently well controlled  BMI is Body mass index is 24.53 kg/m., he has been working on diet, exercise is limited due to pain, starting some exercises as recommended by Dr. [name].  Wt Readings from Last 3 Encounters:  08/18/20 156 lb 9.6 oz (71 kg)  07/27/20 154 lb 3.2 oz (69.9 kg)  05/03/20 155 lb 6.4 oz (70.5 kg)   His blood pressure has been controlled at home, today their BP is BP: 110/68  He reports was prescribed plain losartan 50 mg daily.   He does not workout. He denies chest pain, shortness of breath, dizziness.   He is on cholesterol medication (atorvastatin 40 mg daily) and denies myalgias. His LDL cholesterol is at goal; triglycerides remain mildly elevated. The cholesterol last visit was:   Lab Results  Component Value Date   CHOL 172 05/03/2020   HDL 67 05/03/2020   LDLCALC 76 05/03/2020   TRIG 203 (H) 05/03/2020   CHOLHDL 2.6 05/03/2020    He has been working on diet and exercise for glucose management. Last A1C in the office was:  Lab Results  Component Value Date   HGBA1C 4.9 05/03/2020    He has CKD IIIb monitored at this office and follows annually with Dr. Ronalee Belts, on losartan 100 mg:  Lab Results  Component Value Date   GFRNONAA 40 (L) 07/27/2020   Patient is on Vitamin D supplement at goal at recent check:   Lab Results  Component Value Date   VD25OH 95 05/03/2020        Current Medications:  Current Outpatient Medications on File Prior to Visit  Medication Sig  . acetaminophen (TYLENOL) 500 MG tablet Take 500 mg by  mouth every 6 (six) hours as needed.   Marland Kitchen amphetamine-dextroamphetamine (ADDERALL) 20 MG tablet   . atenolol (TENORMIN) 100 MG tablet Take 1 tablet daily for blood pressure.  Marland Kitchen atorvastatin (LIPITOR) 80 MG tablet Take 1 tablet Daily for Cholesterol  . Cholecalciferol (VITAMIN D PO) Take 10,000 Units by mouth daily.   Marland Kitchen esomeprazole (NEXIUM) 40 MG capsule TAKE ONE CAPSULE DAILY AT NOON FOR REFLUX. (Patient taking differently: Take one capsule every other day)  . finasteride (PROSCAR) 5 MG tablet Take 1 tablet (5 mg total) by mouth daily.  Marland Kitchen LORazepam (ATIVAN) 1 MG tablet Take 1/2 to 1 tablet at Bedtime as needed for Sleep  . losartan-hydrochlorothiazide (HYZAAR) 100-25 MG tablet Take 1 tablet Daily for BP  . oxyCODONE (OXY IR/ROXICODONE) 5 MG immediate release tablet Take 5 mg by mouth daily as needed.  . valACYclovir (VALTREX) 500 MG tablet TAKE 1 TABLET BY MOUTH THREE TIMES DAILY FOR FEVER BLISTERS (Patient taking differently: as needed. )   No current facility-administered medications on file prior to visit.     Allergies:  Allergies  Allergen Reactions  . Nsaids     Renal Insufficiency     Medical History:  Past Medical History:  Diagnosis Date  .  Arthritis    right thumb  . 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 Dry eyes    Takes Restasis  . GERD (gastroesophageal reflux disease)   . Hyperlipidemia   . Hypertension   . Vitamin D deficiency    Family history- Reviewed and unchanged Social history- Reviewed and unchanged   Review of Systems:  Review of Systems  Constitutional: Negative for malaise/fatigue and weight loss.  HENT: Negative for hearing loss and tinnitus.   Eyes: Negative for blurred vision and double vision.  Respiratory: Negative for cough, shortness of breath and wheezing.   Cardiovascular: Negative for chest pain, palpitations, orthopnea, claudication and leg swelling.  Gastrointestinal: Negative for abdominal pain, blood in stool,  constipation, diarrhea, heartburn, melena, nausea and vomiting.  Genitourinary: Negative.   Musculoskeletal: Positive for back pain. Negative for joint pain and myalgias.  Skin: Negative for rash.  Neurological: Positive for tingling (R lower leg). Negative for dizziness, sensory change, focal weakness, weakness and headaches.  Endo/Heme/Allergies: Negative for polydipsia.  Psychiatric/Behavioral: Negative for depression, memory loss, substance abuse and suicidal ideas. The patient has insomnia. The patient is not nervous/anxious.   All other systems reviewed and are negative.   Physical Exam: BP 110/68   Pulse 63   Temp (!) 97 F (36.1 C)   Wt 156 lb 9.6 oz (71 kg)   SpO2 96%   BMI 24.53 kg/m  Wt Readings from Last 3 Encounters:  08/18/20 156 lb 9.6 oz (71 kg)  07/27/20 154 lb 3.2 oz (69.9 kg)  05/03/20 155 lb 6.4 oz (70.5 kg)   General Appearance: Well nourished, in no apparent distress. Eyes: PERRLA, EOMs, conjunctiva no swelling or erythema Sinuses: No Frontal/maxillary tenderness ENT/Mouth: Ext aud canals clear, TMs without erythema, bulging. No erythema, swelling, or exudate on post pharynx.  Tonsils not swollen or erythematous. Hearing normal.  Neck: Supple, thyroid normal.  Respiratory: Respiratory effort normal, BS equal bilaterally without rales, rhonchi, wheezing or stridor.  Cardio: RRR with no MRGs. Brisk peripheral pulses without edema.  Abdomen: Soft, + BS.  Non tender, no guarding, rebound, hernias, masses. Lymphatics: Non tender without lymphadenopathy.  Musculoskeletal: Full ROM (pain with ROM in lumbar), 5/5 strength, Normal gait  Skin: Warm, dry without rashes, lesions, ecchymosis.  Neuro: Cranial nerves intact. No cerebellar symptoms.  Psych: Awake and oriented X 3, normal affect, Insight and Judgment appropriate.    Dan Maker, NP 11:06 AM [facility]

## 2020-08-18 ENCOUNTER — Encounter: Payer: Self-pay | Admitting: Adult Health

## 2020-08-18 ENCOUNTER — Ambulatory Visit (INDEPENDENT_AMBULATORY_CARE_PROVIDER_SITE_OTHER): Payer: PPO | Admitting: Adult Health

## 2020-08-18 ENCOUNTER — Other Ambulatory Visit: Payer: Self-pay

## 2020-08-18 VITALS — BP 110/68 | HR 63 | Temp 97.0°F | Wt 156.6 lb

## 2020-08-18 DIAGNOSIS — E785 Hyperlipidemia, unspecified: Secondary | ICD-10-CM

## 2020-08-18 DIAGNOSIS — Z6824 Body mass index (BMI) 24.0-24.9, adult: Secondary | ICD-10-CM

## 2020-08-18 DIAGNOSIS — I10 Essential (primary) hypertension: Secondary | ICD-10-CM

## 2020-08-18 DIAGNOSIS — N1832 Chronic kidney disease, stage 3b: Secondary | ICD-10-CM

## 2020-08-18 DIAGNOSIS — M25511 Pain in right shoulder: Secondary | ICD-10-CM | POA: Diagnosis not present

## 2020-08-18 DIAGNOSIS — Z79899 Other long term (current) drug therapy: Secondary | ICD-10-CM | POA: Diagnosis not present

## 2020-08-18 DIAGNOSIS — E559 Vitamin D deficiency, unspecified: Secondary | ICD-10-CM | POA: Diagnosis not present

## 2020-08-18 DIAGNOSIS — G4700 Insomnia, unspecified: Secondary | ICD-10-CM | POA: Diagnosis not present

## 2020-08-18 MED ORDER — BELSOMRA 15 MG PO TABS: 1.0000 | ORAL_TABLET | Freq: Every evening | ORAL | 0 refills | Status: DC | PRN

## 2020-08-18 NOTE — Patient Instructions (Signed)
Suvorexant oral tablets °What is this medicine? °SUVOREXANT (su-vor-EX-ant) is used to treat insomnia. This medicine helps you to fall asleep and sleep through the night. °This medicine may be used for other purposes; ask your health care provider or pharmacist if you have questions. °COMMON BRAND NAME(S): Belsomra °What should I tell my health care provider before I take this medicine? °They need to know if you have any of these conditions: °· depression °· drink alcohol °· drug abuse or addiction °· feel sleepy or have fallen asleep suddenly during the day °· history of a sudden onset of muscle weakness (cataplexy) °· liver disease °· lung or breathing disease, like asthma or emphysema °· sleep apnea °· suicidal thoughts, plans, or attempt; a previous suicide attempt by you or a family member °· an unusual or allergic reaction to suvorexant, other medicines, foods, dyes, or preservatives °· pregnant or trying to get pregnant °· breast-feeding °How should I use this medicine? °Take this medicine by mouth within 30 minutes of going to bed. Do not take it unless you are able to stay in bed a full night before you must be active again. Follow the directions on the prescription label. You may take this medicine with or without a food. However, this medicine may take longer to work if you take it with or right after meals. Do not take your medicine more often than directed. Do not stop taking this medicine on your own. Always follow your doctor or health care professional's advice. °A special MedGuide will be given to you by the pharmacist with each prescription and refill. Be sure to read this information carefully each time. °Talk to your pediatrician regarding the use of this medicine in children. Special care may be needed. °Overdosage: If you think you have taken too much of this medicine contact a poison control center or emergency room at once. °NOTE: This medicine is only for you. Do not share this medicine with  others. °What if I miss a dose? °This medicine should only be taken immediately before going to sleep. Do not take double or extra doses. °What may interact with this medicine? °· alcohol °· antihistamines for allergy, cough, or cold °· aprepitant °· boceprevir °· certain antibiotics like ciprofloxacin, clarithromycin, erythromycin, telithromycin °· certain antivirals for HIV or AIDS °· certain medicines for anxiety or sleep °· certain medicines for depression like amitriptyline, fluoxetine, nefazodone, sertraline °· certain medicines for fungal infections like ketoconazole, posaconazole, fluconazole, itraconazole °· certain medicines for seizures like carbamazepine, phenobarbital, primidone, phenytoin °· conivaptan °· digoxin °· diltiazem °· general anesthetics like halothane, isoflurane, methoxyflurane, propofol °· grapefruit juice °· imatinib °· medicines that relax muscles for surgery °· narcotic medicines for pain °· phenothiazines like chlorpromazine, mesoridazine, prochlorperazine, thioridazine °· rifampin °· verapamil °This list may not describe all possible interactions. Give your health care provider a list of all the medicines, herbs, non-prescription drugs, or dietary supplements you use. Also tell them if you smoke, drink alcohol, or use illegal drugs. Some items may interact with your medicine. °What should I watch for while using this medicine? °Visit your health care professional for regular checks on your progress. Tell your health care professional if your symptoms do not start to get better or if they get worse. Avoid caffeine-containing drinks in the evening hours. °After taking this medicine, you may get up out of bed and do an activity that you do not know you are doing. The next morning, you may have no memory of this.   Activities include driving a car ("sleep-driving"), making and eating food, talking on the phone, sexual activity, and sleep-walking. Serious injuries have occurred. Call your  doctor right away if you find out you have done any of these activities. Do not take this medicine if you have used alcohol that evening. Do not take it if you have taken another medicine for sleep. °Do not take this medicine unless you are able to stay in bed for a full night (7 to 8 hours) and do not drive or perform other activities requiring full alertness within 8 hours of a dose. Do not drive, use machinery, or do anything that needs mental alertness the day after you take the 20 mg dose of this medicine. The use of lower doses (10 mg) may also cause driving impairment the next day. You may have a decrease in mental alertness the day after use, even if you feel that you are fully awake. Tell your doctor if you will need to perform activities requiring full alertness, such as driving, the next day. Do not stand or sit up quickly after taking this medicine, especially if you are an older patient. This reduces the risk of dizzy or fainting spells. °If you or your family notice any changes in your behavior, such as new or worsening depression, thoughts of harming yourself, anxiety, other unusual or disturbing thoughts, or memory loss, call your health care professional right away. °After you stop taking this medicine, you may have trouble falling asleep. This is called rebound insomnia. This problem usually goes away on its own after 1 or 2 nights. °What side effects may I notice from receiving this medicine? °Side effects that you should report to your doctor or health care professional as soon as possible: °· allergic reactions like skin rash, itching or hives, swelling of the face, lips, or tongue °· hallucinations °· periods of leg weakness lasting from seconds to a few minutes °· suicidal thoughts, mood changes °· unable to move or speak for several minutes while going to sleep or waking up °· unusual activities while not fully awake like driving, eating, making phone calls, or sexual activity °Side effects  that usually do not require medical attention (report these to your doctor or health care professional if they continue or are bothersome): °· daytime drowsiness °· headache °· nightmares or abnormal dreams °· tiredness °This list may not describe all possible side effects. Call your doctor for medical advice about side effects. You may report side effects to FDA at 1-800-FDA-1088. °Where should I keep my medicine? °Keep out of the reach of children. This medicine can be abused. Keep your medicine in a safe place to protect it from theft. Do not share this medicine with anyone. Selling or giving away this medicine is dangerous and against the law. °Store at room temperature between 15 and 30 degrees C (59 and 86 degrees F). Throw away any unused medicine after the expiration date. °NOTE: This sheet is a summary. It may not cover all possible information. If you have questions about this medicine, talk to your doctor, pharmacist, or health care provider. °© 2020 [employer] (2018-12-11 16:37:12) ° °

## 2020-08-19 ENCOUNTER — Other Ambulatory Visit: Payer: Self-pay | Admitting: Adult Health

## 2020-08-19 DIAGNOSIS — D649 Anemia, unspecified: Secondary | ICD-10-CM

## 2020-08-19 DIAGNOSIS — N289 Disorder of kidney and ureter, unspecified: Secondary | ICD-10-CM

## 2020-08-19 LAB — CBC WITH DIFFERENTIAL/PLATELET
Absolute Monocytes: 663 cells/uL (ref 200–950)
Basophils Absolute: 72 cells/uL (ref 0–200)
Basophils Relative: 1.1 %
Eosinophils Absolute: 150 cells/uL (ref 15–500)
Eosinophils Relative: 2.3 %
HCT: 37.7 % — ABNORMAL LOW (ref 38.5–50.0)
Hemoglobin: 13.1 g/dL — ABNORMAL LOW (ref 13.2–17.1)
Lymphs Abs: 1632 cells/uL (ref 850–3900)
MCH: 34.9 pg — ABNORMAL HIGH (ref 27.0–33.0)
MCHC: 34.7 g/dL (ref 32.0–36.0)
MCV: 100.5 fL — ABNORMAL HIGH (ref 80.0–100.0)
MPV: 10.2 fL (ref 7.5–12.5)
Monocytes Relative: 10.2 %
Neutro Abs: 3985 cells/uL (ref 1500–7800)
Neutrophils Relative %: 61.3 %
Platelets: 219 10*3/uL (ref 140–400)
RBC: 3.75 10*6/uL — ABNORMAL LOW (ref 4.20–5.80)
RDW: 11.8 % (ref 11.0–15.0)
Total Lymphocyte: 25.1 %
WBC: 6.5 10*3/uL (ref 3.8–10.8)

## 2020-08-19 LAB — LIPID PANEL
Cholesterol: 138 mg/dL (ref <200)
HDL: 41 mg/dL (ref >=40)
LDL Cholesterol (Calc): 67 mg/dL (calc)
Non-HDL Cholesterol (Calc): 97 mg/dL (calc) (ref <130)
Total CHOL/HDL Ratio: 3.4 (calc) (ref <5.0)
Triglycerides: 255 mg/dL — ABNORMAL HIGH (ref <150)

## 2020-08-19 LAB — COMPLETE METABOLIC PANEL WITH GFR
AG Ratio: 2 (calc) (ref 1.0–2.5)
ALT: 18 U/L (ref 9–46)
AST: 23 U/L (ref 10–35)
Albumin: 4.1 g/dL (ref 3.6–5.1)
Alkaline phosphatase (APISO): 74 U/L (ref 35–144)
BUN/Creatinine Ratio: 10 (calc) (ref 6–22)
BUN: 24 mg/dL (ref 7–25)
CO2: 28 mmol/L (ref 20–32)
Calcium: 9.4 mg/dL (ref 8.6–10.3)
Chloride: 105 mmol/L (ref 98–110)
Creat: 2.49 mg/dL — ABNORMAL HIGH (ref 0.70–1.25)
GFR, Est African American: 30 mL/min/{1.73_m2} — ABNORMAL LOW (ref >=60)
GFR, Est Non African American: 26 mL/min/{1.73_m2} — ABNORMAL LOW (ref >=60)
Globulin: 2.1 g/dL (calc) (ref 1.9–3.7)
Glucose, Bld: 89 mg/dL (ref 65–99)
Potassium: 4.7 mmol/L (ref 3.5–5.3)
Sodium: 141 mmol/L (ref 135–146)
Total Bilirubin: 0.5 mg/dL (ref 0.2–1.2)
Total Protein: 6.2 g/dL (ref 6.1–8.1)

## 2020-08-19 LAB — TSH: TSH: 1.49 mIU/L (ref 0.40–4.50)

## 2020-08-19 LAB — MAGNESIUM: Magnesium: 1.6 mg/dL (ref 1.5–2.5)

## 2020-08-24 ENCOUNTER — Ambulatory Visit (INDEPENDENT_AMBULATORY_CARE_PROVIDER_SITE_OTHER): Payer: PPO

## 2020-08-24 ENCOUNTER — Other Ambulatory Visit: Payer: Self-pay

## 2020-08-24 DIAGNOSIS — M4125 Other idiopathic scoliosis, thoracolumbar region: Secondary | ICD-10-CM | POA: Diagnosis not present

## 2020-08-24 DIAGNOSIS — S32009S Unspecified fracture of unspecified lumbar vertebra, sequela: Secondary | ICD-10-CM | POA: Diagnosis not present

## 2020-08-24 DIAGNOSIS — M47817 Spondylosis without myelopathy or radiculopathy, lumbosacral region: Secondary | ICD-10-CM | POA: Diagnosis not present

## 2020-08-24 DIAGNOSIS — M5416 Radiculopathy, lumbar region: Secondary | ICD-10-CM | POA: Diagnosis not present

## 2020-08-24 DIAGNOSIS — D649 Anemia, unspecified: Secondary | ICD-10-CM

## 2020-08-24 DIAGNOSIS — N289 Disorder of kidney and ureter, unspecified: Secondary | ICD-10-CM | POA: Diagnosis not present

## 2020-08-24 NOTE — Progress Notes (Signed)
Patient reports for lab blood work which was entered into [employer] by ordering provider.  Patient had no concerns/questions at the time of check out.

## 2020-08-25 LAB — CBC WITH DIFFERENTIAL/PLATELET
Absolute Monocytes: 725 cells/uL (ref 200–950)
Basophils Absolute: 63 cells/uL (ref 0–200)
Basophils Relative: 1 %
Eosinophils Absolute: 101 cells/uL (ref 15–500)
Eosinophils Relative: 1.6 %
HCT: 39.6 % (ref 38.5–50.0)
Hemoglobin: 13.4 g/dL (ref 13.2–17.1)
Lymphs Abs: 1348 cells/uL (ref 850–3900)
MCH: 34.4 pg — ABNORMAL HIGH (ref 27.0–33.0)
MCHC: 33.8 g/dL (ref 32.0–36.0)
MCV: 101.5 fL — ABNORMAL HIGH (ref 80.0–100.0)
MPV: 10.2 fL (ref 7.5–12.5)
Monocytes Relative: 11.5 %
Neutro Abs: 4064 cells/uL (ref 1500–7800)
Neutrophils Relative %: 64.5 %
Platelets: 229 10*3/uL (ref 140–400)
RBC: 3.9 10*6/uL — ABNORMAL LOW (ref 4.20–5.80)
RDW: 11.8 % (ref 11.0–15.0)
Total Lymphocyte: 21.4 %
WBC: 6.3 10*3/uL (ref 3.8–10.8)

## 2020-08-25 LAB — BASIC METABOLIC PANEL WITH GFR
BUN/Creatinine Ratio: 11 (calc) (ref 6–22)
BUN: 17 mg/dL (ref 7–25)
CO2: 27 mmol/L (ref 20–32)
Calcium: 9.5 mg/dL (ref 8.6–10.3)
Chloride: 104 mmol/L (ref 98–110)
Creat: 1.57 mg/dL — ABNORMAL HIGH (ref 0.70–1.25)
GFR, Est African American: 52 mL/min/{1.73_m2} — ABNORMAL LOW (ref >=60)
GFR, Est Non African American: 45 mL/min/{1.73_m2} — ABNORMAL LOW (ref >=60)
Glucose, Bld: 96 mg/dL (ref 65–99)
Potassium: 4.8 mmol/L (ref 3.5–5.3)
Sodium: 139 mmol/L (ref 135–146)

## 2020-08-25 LAB — URINALYSIS, ROUTINE W REFLEX MICROSCOPIC
Bilirubin Urine: NEGATIVE
Glucose, UA: NEGATIVE
Hgb urine dipstick: NEGATIVE
Ketones, ur: NEGATIVE
Leukocytes,Ua: NEGATIVE
Nitrite: NEGATIVE
Protein, ur: NEGATIVE
Specific Gravity, Urine: 1.013 (ref 1.001–1.03)
pH: 6 (ref 5.0–8.0)

## 2020-09-13 DIAGNOSIS — M5416 Radiculopathy, lumbar region: Secondary | ICD-10-CM | POA: Diagnosis not present

## 2020-09-26 ENCOUNTER — Ambulatory Visit
Admission: RE | Admit: 2020-09-26 | Discharge: 2020-09-26 | Disposition: A | Discharge status: Home / Self Care | Payer: PPO | Source: Ambulatory Visit | Attending: Internal Medicine | Admitting: Internal Medicine

## 2020-09-26 ENCOUNTER — Other Ambulatory Visit: Payer: Self-pay

## 2020-09-26 DIAGNOSIS — M85851 Other specified disorders of bone density and structure, right thigh: Secondary | ICD-10-CM | POA: Diagnosis not present

## 2020-09-26 DIAGNOSIS — E349 Endocrine disorder, unspecified: Secondary | ICD-10-CM

## 2020-09-26 DIAGNOSIS — S32021A Stable burst fracture of second lumbar vertebra, initial encounter for closed fracture: Secondary | ICD-10-CM

## 2020-09-27 NOTE — Progress Notes (Signed)
==================================================== ====================================================  -   Bone Density Scan - shows Osteopenia   - Important to take recommended doses of Vitamin D &  - Calcium 500 to 600 mg Daily    - and exercise

## 2020-10-05 ENCOUNTER — Other Ambulatory Visit: Payer: Self-pay | Admitting: Internal Medicine

## 2020-10-05 DIAGNOSIS — I10 Essential (primary) hypertension: Secondary | ICD-10-CM | POA: Diagnosis not present

## 2020-10-05 DIAGNOSIS — M5417 Radiculopathy, lumbosacral region: Secondary | ICD-10-CM | POA: Diagnosis not present

## 2020-10-16 DIAGNOSIS — M48061 Spinal stenosis, lumbar region without neurogenic claudication: Secondary | ICD-10-CM | POA: Diagnosis not present

## 2020-10-16 DIAGNOSIS — M5417 Radiculopathy, lumbosacral region: Secondary | ICD-10-CM | POA: Diagnosis not present

## 2020-10-16 DIAGNOSIS — M5416 Radiculopathy, lumbar region: Secondary | ICD-10-CM | POA: Diagnosis not present

## 2020-10-23 ENCOUNTER — Other Ambulatory Visit: Payer: Self-pay | Admitting: Internal Medicine

## 2020-10-23 DIAGNOSIS — G8929 Other chronic pain: Secondary | ICD-10-CM

## 2020-10-23 DIAGNOSIS — M5440 Lumbago with sciatica, unspecified side: Secondary | ICD-10-CM

## 2020-10-23 MED ORDER — DEXAMETHASONE 4 MG PO TABS: ORAL_TABLET | ORAL | 0 refills | Status: DC

## 2020-11-22 ENCOUNTER — Encounter: Payer: Self-pay | Admitting: Internal Medicine

## 2020-11-22 DIAGNOSIS — Z79891 Long term (current) use of opiate analgesic: Secondary | ICD-10-CM | POA: Diagnosis not present

## 2020-11-22 DIAGNOSIS — M4125 Other idiopathic scoliosis, thoracolumbar region: Secondary | ICD-10-CM | POA: Diagnosis not present

## 2020-11-22 DIAGNOSIS — M47817 Spondylosis without myelopathy or radiculopathy, lumbosacral region: Secondary | ICD-10-CM | POA: Diagnosis not present

## 2020-11-22 DIAGNOSIS — S32009S Unspecified fracture of unspecified lumbar vertebra, sequela: Secondary | ICD-10-CM | POA: Diagnosis not present

## 2020-11-22 DIAGNOSIS — M5416 Radiculopathy, lumbar region: Secondary | ICD-10-CM | POA: Diagnosis not present

## 2020-11-22 DIAGNOSIS — G894 Chronic pain syndrome: Secondary | ICD-10-CM | POA: Diagnosis not present

## 2020-11-22 NOTE — Progress Notes (Signed)
Annual  Screening/Preventative Visit  & Comprehensive Evaluation & Examination      This very nice 69 y.o.  DWM presents for a Screening /Preventative Visit & comprehensive evaluation and management of multiple medical co-morbidities.  Patient has been followed for HTN, HLD, Prediabetes and Vitamin D Deficiency.His GERD is controlled on diet & his Meds. 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  Patient had hx/o prior back surgery (2020) & has hx/o chronic pain     HTN predates circa 1988. Patient's BP has been controlled at home.  Today's BP is at goal -  102/80. Patient has CKD3a (GFR 45) . Patient denies any cardiac symptoms as chest pain, palpitations, shortness of breath, dizziness or ankle swelling.      Patient's hyperlipidemia is controlled with diet and Atorvastatin. Patient denies myalgias or other medication SE's. Last lipids were at goal except elevated Trig's:  Lab Results  Component Value Date   CHOL 138 08/18/2020   HDL 41 08/18/2020   LDLCALC 67 08/18/2020   TRIG 255 (H) 08/18/2020   CHOLHDL 3.4 08/18/2020       Patient has hx/o prediabetes Insulin Resistance(Insulin 28.05/2015 &33.7 /2018) and patient denies reactive hypoglycemic symptoms, visual blurring, diabetic polys or paresthesias. Last A1c was normal & at goal:   Lab Results  Component Value Date   HGBA1C 4.9 05/03/2020        Finally, patient has history of Vitamin D Deficiency ("28" /2008)and last vitamin D was at goal:  Lab Results  Component Value Date   VD25OH 95 05/03/2020    Current Outpatient Medications on File Prior to Visit  Medication Sig   acetaminophen (TYLENOL) 500 MG tablet Take 500 mg by mouth every 6 (six) hours as needed.    ADDERALL 20 MG tablet    atenolol  100 MG  TAKE 1 TABLET DAILY    atorvastatin 80 MG tablet Take 1 tablet Daily for    VITAMIN D  10,000 Units Takes  daily.    esomeprazole  40 MG  Take 1 cap Daily    finasteride  5 MG  Take 1 tablet daily.   LORazepam  1 MG  Take 1/2 to 1 tablet at Bedtime as needed   losartan 50 MG tablet Take daily.   oxyCODONE  5 MG  Take  daily as needed.   BELSOMRA 15 MG TABS Take 1 tablet  at bedtime as needed.   valACYclovir (500 MG tablet TAKE 1 TABLET 3 x/day   as needed.     Allergies  Allergen Reactions   Nsaids     Renal Insufficiency    Past Medical History:  Diagnosis Date   Arthritis    right thumb   Avascular necrosis of left femoral head (HCC) 10/28/2014   Dry eyes    Takes Restasis   GERD (gastroesophageal reflux disease)    Hyperlipidemia    Hypertension    Vitamin D deficiency    Health Maintenance  Topic Date Due   COVID-19 Vaccine (1) Never done   INFLUENZA VACCINE  07/09/2020   PNA vac Low Risk Adult (2 of 2 - PPSV23) 10/21/2020   COLONOSCOPY  07/15/2023   TETANUS/TDAP  07/21/2029   Hepatitis C Screening  Completed   Immunization History  Administered Date(s) Administered   Influenza Split 09/22/2013   Influenza, High Dose Seasonal PF 10/22/2019   Influenza-Unspecified 09/08/2017, 08/19/2018   PPD Test 09/13/2014, 07/25/2015, 08/08/2016, 09/08/2017, 10/09/2018  Pneumococcal Conjugate-13 01/22/2018, 10/22/2019   Pneumococcal Polysaccharide-23 01/19/2009   Td 07/22/2019   Tdap 01/19/2009   Zoster 01/29/2013   Last Colon - 07/14/2018 -Dr Randa Evens - Negative - Recc 10 yr f/u due Aug 2029  Past Surgical History:  Procedure Laterality Date   BACK SURGERY  11/19/2019   L3-4 R foraminectomy, Dr. [name]   COLONOSCOPY W/ POLYPECTOMY     ESOPHAGOGASTRODUODENOSCOPY  1990   TOTAL HIP ARTHROPLASTY Left 10/28/2014   Procedure: TOTAL HIP ARTHROPLASTY ANTERIOR APPROACH;  Surgeon: Eldred Manges, MD;  Location: [facility] OR;  Service: Orthopedics;  Laterality: Left;   Family History  Problem Relation Age of Onset   Osteoporosis Mother    Schizophrenia Mother    Heart disease Father    COPD Father     Diabetes Brother    Social History   Socioeconomic History   Marital status: Divorced   Number of children: Not on file  Occupational History   Not on file  Tobacco Use   Smoking status: Never Smoker   Smokeless tobacco: Never Used  Substance and Sexual Activity   Alcohol use: Yes    Comment: social   Drug use: No   Sexual activity: Not on file     ROS Constitutional: Denies fever, chills, weight loss/gain, headaches, insomnia,  night sweats or change in appetite. Does c/o fatigue. Eyes: Denies redness, blurred vision, diplopia, discharge, itchy or watery eyes.  ENT: Denies discharge, congestion, post nasal drip, epistaxis, sore throat, earache, hearing loss, dental pain, Tinnitus, Vertigo, Sinus pain or snoring.  Cardio: Denies chest pain, palpitations, irregular heartbeat, syncope, dyspnea, diaphoresis, orthopnea, PND, claudication or edema Respiratory: denies cough, dyspnea, DOE, pleurisy, hoarseness, laryngitis or wheezing.  Gastrointestinal: Denies dysphagia, heartburn, reflux, water brash, pain, cramps, nausea, vomiting, bloating, diarrhea, constipation, hematemesis, melena, hematochezia, jaundice or hemorrhoids Genitourinary: Denies dysuria, frequency, urgency, nocturia, hesitancy, discharge, hematuria or flank pain Musculoskeletal: Denies arthralgia, myalgia, stiffness, Jt. Swelling, pain, limp or strain/sprain. Denies Falls. Skin: Denies puritis, rash, hives, warts, acne, eczema or change in skin lesion Neuro: No weakness, tremor, incoordination, spasms, paresthesia or pain Psychiatric: Denies confusion, memory loss or sensory loss. Denies Depression. Endocrine: Denies change in weight, skin, hair change, nocturia, and paresthesia, diabetic polys, visual blurring or hyper / hypo glycemic episodes.  Heme/Lymph: No excessive bleeding, bruising or enlarged lymph nodes.  Physical Exam  BP 102/80    Pulse 60    Temp 97.7 F (36.5 C)    Ht 5\' 7"  (1.702 m)    Wt 155 lb  (70.3 kg)    SpO2 97%    BMI 24.28 kg/m   General Appearance: Well nourished and well groomed and in no apparent distress.  Eyes: PERRLA, EOMs, conjunctiva no swelling or erythema, normal fundi and vessels. Sinuses: No frontal/maxillary tenderness ENT/Mouth: EACs patent / TMs  nl. Nares clear without erythema, swelling, mucoid exudates. Oral hygiene is good. No erythema, swelling, or exudate. Tongue normal, non-obstructing. Tonsils not swollen or erythematous. Hearing normal.  Neck: Supple, thyroid not palpable. No bruits, nodes or JVD. Respiratory: Respiratory effort normal.  BS equal and clear bilateral without rales, rhonci, wheezing or stridor. Cardio: Heart sounds are normal with regular rate and rhythm and no murmurs, rubs or gallops. Peripheral pulses are normal and equal bilaterally without edema. No aortic or femoral bruits. Chest: symmetric with normal excursions and percussion.  Abdomen: Soft, with Nl bowel sounds. Nontender, no guarding, rebound, hernias, masses, or organomegaly.  Lymphatics: Non tender without lymphadenopathy.  Musculoskeletal:  Full ROM all peripheral extremities, joint stability, 5/5 strength, and normal gait. Skin: Warm and dry without rashes, lesions, cyanosis, clubbing or  ecchymosis.  Neuro: Cranial nerves intact, reflexes equal bilaterally. Normal muscle tone, no cerebellar symptoms. Sensation intact.  Pysch: Alert and oriented X 3 with normal affect, insight and judgment appropriate.   Assessment and Plan  1. Annual Preventative/Screening Exam    2. Essential hypertension  - EKG 12-Lead - [place], RETROPERITNL ABD,  LTD - Urinalysis, Routine w reflex microscopic - Microalbumin / creatinine urine ratio - CBC with Differential/Platelet - COMPLETE METABOLIC PANEL WITH GFR - Magnesium - TSH  3. Hyperlipidemia, mixed  - EKG 12-Lead - [place], RETROPERITNL ABD,  LTD - Lipid panel - TSH  4. Abnormal glucose  - EKG 12-Lead - [place], RETROPERITNL ABD,   LTD - Hemoglobin A1c - Insulin, random  5. Vitamin D deficiency  - VITAMIN D 25 Hydroxy   6. Stage 3a chronic kidney disease (HCC)  - Urinalysis, Routine w reflex microscopic - Microalbumin / creatinine urine ratio - COMPLETE METABOLIC PANEL WITH GFR  7. Chronic bilateral low back pain without sciatica  8. Insulin resistance  - Hemoglobin A1c - Insulin, random  9. Attention deficit disorder  10. Idiopathic gout  - Uric acid  11. Screening for colorectal cancer  - POC Hemoccult Bld/Stl  12. Screening for ischemic heart disease  - EKG 12-Lead  13. FHx: heart disease  - EKG 12-Lead - [place], RETROPERITNL ABD,  LTD  14. Screening for AAA (aortic abdominal aneurysm)  - [place], RETROPERITNL ABD,  LTD  15. 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 Prostate cancer screening  - PSA       Patient was counseled in prudent diet, weight control to achieve/maintain BMI less than 25, BP monitoring, regular exercise and medications as discussed.  Discussed med effects and SE's. Routine screening labs and tests as requested with regular follow-up as recommended. Over 40 minutes of exam, counseling, chart review and high complex critical decision making was performed   Marinus Maw, MD

## 2020-11-22 NOTE — Patient Instructions (Signed)

## 2020-11-23 ENCOUNTER — Encounter: Payer: Self-pay | Admitting: Internal Medicine

## 2020-11-23 ENCOUNTER — Other Ambulatory Visit: Payer: Self-pay

## 2020-11-23 ENCOUNTER — Ambulatory Visit (INDEPENDENT_AMBULATORY_CARE_PROVIDER_SITE_OTHER): Payer: PPO | Admitting: Internal Medicine

## 2020-11-23 VITALS — BP 102/80 | HR 60 | Temp 97.7°F | Ht 67.0 in | Wt 155.0 lb

## 2020-11-23 DIAGNOSIS — Z8249 Family history of ischemic heart disease and other diseases of the circulatory system: Secondary | ICD-10-CM | POA: Diagnosis not present

## 2020-11-23 DIAGNOSIS — I10 Essential (primary) hypertension: Secondary | ICD-10-CM

## 2020-11-23 DIAGNOSIS — N138 Other obstructive and reflux uropathy: Secondary | ICD-10-CM | POA: Diagnosis not present

## 2020-11-23 DIAGNOSIS — Z125 Encounter for screening for malignant neoplasm of prostate: Secondary | ICD-10-CM

## 2020-11-23 DIAGNOSIS — F988 Other specified behavioral and emotional disorders with onset usually occurring in childhood and adolescence: Secondary | ICD-10-CM

## 2020-11-23 DIAGNOSIS — M1000 Idiopathic gout, unspecified site: Secondary | ICD-10-CM

## 2020-11-23 DIAGNOSIS — N1831 Chronic kidney disease, stage 3a: Secondary | ICD-10-CM

## 2020-11-23 DIAGNOSIS — Z136 Encounter for screening for cardiovascular disorders: Secondary | ICD-10-CM

## 2020-11-23 DIAGNOSIS — Z79899 Other long term (current) drug therapy: Secondary | ICD-10-CM

## 2020-11-23 DIAGNOSIS — E559 Vitamin D deficiency, unspecified: Secondary | ICD-10-CM | POA: Diagnosis not present

## 2020-11-23 DIAGNOSIS — G8929 Other chronic pain: Secondary | ICD-10-CM

## 2020-11-23 DIAGNOSIS — E782 Mixed hyperlipidemia: Secondary | ICD-10-CM | POA: Diagnosis not present

## 2020-11-23 DIAGNOSIS — Z0001 Encounter for general adult medical examination with abnormal findings: Secondary | ICD-10-CM

## 2020-11-23 DIAGNOSIS — E88819 Insulin resistance, unspecified: Secondary | ICD-10-CM

## 2020-11-23 DIAGNOSIS — E8881 Metabolic syndrome: Secondary | ICD-10-CM

## 2020-11-23 DIAGNOSIS — Z0000 Encounter for general adult medical examination without abnormal findings: Secondary | ICD-10-CM | POA: Diagnosis not present

## 2020-11-23 DIAGNOSIS — Z1211 Encounter for screening for malignant neoplasm of colon: Secondary | ICD-10-CM

## 2020-11-23 DIAGNOSIS — R7309 Other abnormal glucose: Secondary | ICD-10-CM | POA: Diagnosis not present

## 2020-11-23 DIAGNOSIS — N401 Enlarged prostate with lower urinary tract symptoms: Secondary | ICD-10-CM | POA: Diagnosis not present

## 2020-11-24 LAB — COMPLETE METABOLIC PANEL WITH GFR
AG Ratio: 2.1 (calc) (ref 1.0–2.5)
ALT: 26 U/L (ref 9–46)
AST: 27 U/L (ref 10–35)
Albumin: 4.2 g/dL (ref 3.6–5.1)
Alkaline phosphatase (APISO): 68 U/L (ref 35–144)
BUN/Creatinine Ratio: 12 (calc) (ref 6–22)
BUN: 17 mg/dL (ref 7–25)
CO2: 25 mmol/L (ref 20–32)
Calcium: 9.7 mg/dL (ref 8.6–10.3)
Chloride: 102 mmol/L (ref 98–110)
Creat: 1.47 mg/dL — ABNORMAL HIGH (ref 0.70–1.25)
GFR, Est African American: 56 mL/min/{1.73_m2} — ABNORMAL LOW (ref >=60)
GFR, Est Non African American: 48 mL/min/{1.73_m2} — ABNORMAL LOW (ref >=60)
Globulin: 2 g/dL (calc) (ref 1.9–3.7)
Glucose, Bld: 85 mg/dL (ref 65–99)
Potassium: 4 mmol/L (ref 3.5–5.3)
Sodium: 140 mmol/L (ref 135–146)
Total Bilirubin: 0.8 mg/dL (ref 0.2–1.2)
Total Protein: 6.2 g/dL (ref 6.1–8.1)

## 2020-11-24 LAB — URINALYSIS, ROUTINE W REFLEX MICROSCOPIC
Bilirubin Urine: NEGATIVE
Glucose, UA: NEGATIVE
Hgb urine dipstick: NEGATIVE
Ketones, ur: NEGATIVE
Leukocytes,Ua: NEGATIVE
Nitrite: NEGATIVE
Protein, ur: NEGATIVE
Specific Gravity, Urine: 1.016 (ref 1.001–1.03)
pH: 6.5 (ref 5.0–8.0)

## 2020-11-24 LAB — CBC WITH DIFFERENTIAL/PLATELET
Absolute Monocytes: 663 cells/uL (ref 200–950)
Basophils Absolute: 87 cells/uL (ref 0–200)
Basophils Relative: 1.3 %
Eosinophils Absolute: 127 cells/uL (ref 15–500)
Eosinophils Relative: 1.9 %
HCT: 44.8 % (ref 38.5–50.0)
Hemoglobin: 15.6 g/dL (ref 13.2–17.1)
Lymphs Abs: 1769 cells/uL (ref 850–3900)
MCH: 35.1 pg — ABNORMAL HIGH (ref 27.0–33.0)
MCHC: 34.8 g/dL (ref 32.0–36.0)
MCV: 100.7 fL — ABNORMAL HIGH (ref 80.0–100.0)
MPV: 10.3 fL (ref 7.5–12.5)
Monocytes Relative: 9.9 %
Neutro Abs: 4054 cells/uL (ref 1500–7800)
Neutrophils Relative %: 60.5 %
Platelets: 193 10*3/uL (ref 140–400)
RBC: 4.45 10*6/uL (ref 4.20–5.80)
RDW: 12.7 % (ref 11.0–15.0)
Total Lymphocyte: 26.4 %
WBC: 6.7 10*3/uL (ref 3.8–10.8)

## 2020-11-24 LAB — URIC ACID: Uric Acid, Serum: 7 mg/dL (ref 4.0–8.0)

## 2020-11-24 LAB — LIPID PANEL
Cholesterol: 145 mg/dL (ref <200)
HDL: 49 mg/dL (ref >=40)
LDL Cholesterol (Calc): 70 mg/dL (calc)
Non-HDL Cholesterol (Calc): 96 mg/dL (calc) (ref <130)
Total CHOL/HDL Ratio: 3 (calc) (ref <5.0)
Triglycerides: 184 mg/dL — ABNORMAL HIGH (ref <150)

## 2020-11-24 LAB — TSH: TSH: 1.6 mIU/L (ref 0.40–4.50)

## 2020-11-24 LAB — MAGNESIUM: Magnesium: 1.7 mg/dL (ref 1.5–2.5)

## 2020-11-24 LAB — HEMOGLOBIN A1C
Hgb A1c MFr Bld: 4.9 % of total Hgb (ref <5.7)
Mean Plasma Glucose: 94 mg/dL
eAG (mmol/L): 5.2 mmol/L

## 2020-11-24 LAB — MICROALBUMIN / CREATININE URINE RATIO
Creatinine, Urine: 196 mg/dL (ref 20–320)
Microalb Creat Ratio: 2 mcg/mg creat (ref <30)
Microalb, Ur: 0.4 mg/dL

## 2020-11-24 LAB — INSULIN, RANDOM: Insulin: 66.3 u[IU]/mL — ABNORMAL HIGH

## 2020-11-24 LAB — VITAMIN D 25 HYDROXY (VIT D DEFICIENCY, FRACTURES): Vit D, 25-Hydroxy: 121 ng/mL — ABNORMAL HIGH (ref 30–100)

## 2020-11-24 LAB — PSA: PSA: 0.35 ng/mL (ref <=4.0)

## 2020-11-24 NOTE — Progress Notes (Signed)
========================================================== -   Test results slightly outside the reference range are not unusual. If there is anything important, I will review this with you,  otherwise it is considered normal test values.  If you have further questions,  please do not hesitate to contact me at the office or via My Chart.  ==========================================================  -  PSA - Low - Great  ==========================================================  -  Uric Acid  / Gout Test is Low & Normal - OK  ==========================================================  -  Kidney Functions Better -Stage 3a & Stable  ==========================================================  -   Magnesium  -  1.7   -  very  low - goal is betw 2.0 - 2.5,   - So..............Marland Kitchen  Recommend that you take  Magnesium 500 mg tablet daily   - also important to eat lots of  leafy green vegetables   - spinach - Kale - collards - greens - okra - asparagus  - broccoli - quinoa - squash - almonds   - black, red, white beans  -  peas - green beans ==========================================================  -  Total Chol= 145 and LDL Chol = 70 - Both  - Excellent   - Very low risk for Heart Attack  / Stroke ==========================================================  - A1c - Normal -Great - No Diabetes ! ==========================================================  -  Vitamin D = 125 - too high - So stop for 1 week   (Currently on Vit  D 5,000 = 14 caps /week)   So Cut back to 10 caps /week  =ie.,Take 2 caps  3 x /week on MWF and 1 cap 4 days /week - TThSS ==========================================================  -  All Else - CBC - Electrolytes - Liver - Magnesium & Thyroid    - all  Normal / OK ==========================================================

## 2020-11-29 ENCOUNTER — Encounter: Payer: Self-pay | Admitting: Internal Medicine

## 2020-11-29 ENCOUNTER — Other Ambulatory Visit: Payer: Self-pay

## 2020-11-29 ENCOUNTER — Ambulatory Visit (INDEPENDENT_AMBULATORY_CARE_PROVIDER_SITE_OTHER): Payer: PPO | Admitting: Internal Medicine

## 2020-11-29 VITALS — BP 110/74 | HR 89 | Temp 97.0°F | Resp 16 | Ht 67.0 in | Wt 155.8 lb

## 2020-11-29 DIAGNOSIS — I10 Essential (primary) hypertension: Secondary | ICD-10-CM | POA: Diagnosis not present

## 2020-11-29 DIAGNOSIS — G8929 Other chronic pain: Secondary | ICD-10-CM | POA: Diagnosis not present

## 2020-11-29 DIAGNOSIS — B351 Tinea unguium: Secondary | ICD-10-CM | POA: Diagnosis not present

## 2020-11-29 DIAGNOSIS — M5440 Lumbago with sciatica, unspecified side: Secondary | ICD-10-CM

## 2020-11-29 MED ORDER — TERBINAFINE HCL 250 MG PO TABS: ORAL_TABLET | ORAL | 0 refills | Status: DC

## 2020-11-29 NOTE — Progress Notes (Signed)
History of Present Illness:      Patient is a very nice 69 yo MWM followed with  HTN, HLD, GERD,  Prediabetes and Vitamin D Deficiency who had hx/o back surgeries and chronic pain syndrome. Patient relate pain onset to a fall from a ladder in 2016.  He had neg MRIs and failed trials with EDSI, dry needling, Physical therapy and Lyrica /Gabapentin in the past. In Dec 2020, he had a laminectomy by Dr [name]. Patient presents today for a trigger point injection in his left buttock area. Patient also describes intermittent Left sciatica. He further has concerns re; starting medications for onychomycotic thickened chalky toenails.  Medications  Current Outpatient Medications (Endocrine & Metabolic):  .  dexamethasone (DECADRON) 4 MG tablet, Take     1 tab      3 x day - 3 days, then      2 x day - 3 days, then      1 tab      Daily  Current Outpatient Medications (Cardiovascular):  .  atenolol (TENORMIN) 100 MG tablet, TAKE 1 TABLET BY MOUTH DAILY FOR BLOOD PRESSURE .  atorvastatin (LIPITOR) 80 MG tablet, Take 1 tablet Daily for Cholesterol .  losartan (COZAAR) 50 MG tablet, Take 50 mg by mouth daily.   Current Outpatient Medications (Analgesics):  .  acetaminophen (TYLENOL) 500 MG tablet, Take 500 mg by mouth every 6 (six) hours as needed.    Current Outpatient Medications (Other):  .  amphetamine-dextroamphetamine (ADDERALL) 20 MG tablet,  .  Cholecalciferol (VITAMIN D PO), Take 10,000 Units by mouth daily.  Marland Kitchen  esomeprazole (NEXIUM) 40 MG capsule, TAKE ONE CAPSULE DAILY AT NOON FOR REFLUX. (Patient taking differently: Take one capsule every other day) .  finasteride (PROSCAR) 5 MG tablet, Take 1 tablet (5 mg total) by mouth daily. Marland Kitchen  LORazepam (ATIVAN) 1 MG tablet, Take 1/2 to 1 tablet at Bedtime as needed for Sleep .  Suvorexant (BELSOMRA) 15 MG TABS, Take 1 tablet by mouth at bedtime as needed. .  terbinafine (LAMISIL) 250 MG tablet, Take 1 tablet Daily for Nail Fungus  Problem list He  has Hypertension; Hyperlipidemia; GERD (gastroesophageal reflux disease); Vitamin D deficiency; Medication management; Insomnia; DDD (degenerative disc disease), lumbosacral; CKD (chronic kidney disease) stage 3, GFR 30-59 ml/min (HCC); and ADD (attention deficit disorder) on their problem list.   Observations/Objective:   BP 110/74   Pulse 89   Temp (!) 97 F (36.1 C)   Resp 16   Ht 5\' 7"  (1.702 m)   Wt 155 lb 12.8 oz (70.7 kg)   SpO2 97%   BMI 24.40 kg/m   HEENT - WNL. Neck - supple.  Chest - Clear equal BS. Cor - Nl HS. RRR w/o sig MGR. PP 1(+). No edema. MS- FROM w/o deformities.  Gait Nl. Neuro -  Nl w/o focal abnormalities.  Trigger point is identified in the Lt gluteal area.   PROCEDURE  (CPT: [phone] )     After informed consent and aseptic prep with alcohol, the trigger point area of maximum tenderness was injected and infiltrated  With  2 ml of Dexamethasone (10 mg/ml) =  (20 mg)  and  2.0 ml Marcaine 0.5%.    This was well tolerated, and followed by immediate relief of pain. Return precautions discussed with the patient.   Assessment and Plan:   1. Essential hypertension  2. Chronic low back pain with sciatica  3. Onychomycosis of toenail  - terbinafine (LAMISIL)  250 MG tablet; Take 1 tablet Daily for Nail Fungus  Dispense: 90 tablet; Refill: 0  Follow Up Instructions:        I discussed the assessment and treatment plan with the patient. The patient was provided an opportunity to ask questions and all were answered. The patient agreed with the plan and demonstrated an understanding of the instructions.       The patient was advised to call back or seek an in-person evaluation if the symptoms worsen or if the condition fails to improve as anticipated.   Marinus Maw, MD

## 2020-11-29 NOTE — Patient Instructions (Signed)
Trigger Point Injections Patient Information  Description: Trigger points are areas of muscle sensitive to touch which cause pain with movement, sometimes felt some distance from the site of palpation.  Usually the muscle containing these trigger points if felt as a tight band or knot.   The area of maximum tenderness or trigger point is identified, and after antiseptic preparation of the skin, a small needle is placed into this site.  Reproduction of the pain often occurs and numbing medicine (local anesthetic) is injected into the site, sometimes along with steroid preparation.  The entire block usually lasts less than 5 minutes.  Conditions which may be treated by trigger points:   Muscular pain and spasm  Nerve irritation  Preparation for the injection:  1. Do not eat any solid food or dairy products within 8 hours of your appointment. 2. You may drink clear liquids up to 3 hours before appointment.  Clear liquids include water, black coffee, juice or soda.  No milk or cream please. 3. You may take your regular medications, including pain medications, with a sip of water before your appointment.  Diabetics should hold regular insulin ( if take separately) and take 1/2 normal NPH dose the morning of the procedure.  Carry some sugar containing items with you to your appointment. 4. A driver must accompany you and be prepared to drive you home after your procedure.  5. Bring all your current medications with you. 6. An IV may be inserted and sedation may be given at the discretion of the physician.  7. A blood pressure cuff, EKG, and other monitors will often be applied during the procedure.  Some patients may need to have extra oxygen administered for a short period. 8. You will be asked to provide medical information, including your allergies and medications, prior to the procedure.  We must know immediately if you are taking blood thinners (like Coumadin/Warfarin) or if you are allergic to  IV iodine contrast (dye).  We must know if you could possibly be pregnant.  Possible side-effects:   Bleeding from needle site  Infection (rare, may require surgery)  Nerve injury (rare)  Numbness & tingling (temporary)  Punctured lung (if injection around chest)  Light-headedness (temporary)  Pain at injection site (several days)  Decreased blood pressure (rare, temporary)  Weakness in arm/leg (temporary)  Call if you experience:   Hive or difficulty breathing (go to the emergency room)  Inflammation or drainage at the injection site(s)  Please note:  Although the local anesthetic injected can often make your painful muscle feel good for several hours after the injection, the pain may return.  It takes 3-7 days for steroids to work.  You may not notice any pain relief for at least one week.  If effective, we will often do a series of injections spaced 3-6 weeks apart to maximally decrease your pain.  If you have any questions please call [phone] [facility] 

## 2020-12-20 ENCOUNTER — Other Ambulatory Visit: Payer: PPO

## 2020-12-31 ENCOUNTER — Other Ambulatory Visit: Payer: Self-pay | Admitting: Internal Medicine

## 2020-12-31 DIAGNOSIS — G4700 Insomnia, unspecified: Secondary | ICD-10-CM

## 2020-12-31 MED ORDER — LORAZEPAM 1 MG PO TABS
ORAL_TABLET | ORAL | 0 refills | Status: DC
Start: 2020-12-31 — End: 2021-09-05

## 2021-01-03 DIAGNOSIS — N1831 Chronic kidney disease, stage 3a: Secondary | ICD-10-CM | POA: Diagnosis not present

## 2021-01-04 ENCOUNTER — Ambulatory Visit: Payer: PPO

## 2021-01-08 DIAGNOSIS — N1831 Chronic kidney disease, stage 3a: Secondary | ICD-10-CM | POA: Diagnosis not present

## 2021-01-08 DIAGNOSIS — E673 Hypervitaminosis D: Secondary | ICD-10-CM | POA: Diagnosis not present

## 2021-01-08 DIAGNOSIS — I129 Hypertensive chronic kidney disease with stage 1 through stage 4 chronic kidney disease, or unspecified chronic kidney disease: Secondary | ICD-10-CM | POA: Diagnosis not present

## 2021-01-10 ENCOUNTER — Ambulatory Visit: Payer: PPO

## 2021-01-24 ENCOUNTER — Other Ambulatory Visit: Payer: Self-pay

## 2021-01-24 ENCOUNTER — Ambulatory Visit (INDEPENDENT_AMBULATORY_CARE_PROVIDER_SITE_OTHER): Payer: PPO | Admitting: *Deleted

## 2021-01-24 VITALS — BP 116/66 | HR 57 | Temp 97.2°F | Resp 16 | Wt 159.0 lb

## 2021-01-24 DIAGNOSIS — B351 Tinea unguium: Secondary | ICD-10-CM | POA: Diagnosis not present

## 2021-01-24 NOTE — Progress Notes (Signed)
Patient here for a NV to check an HFP, due to taking Terbinafine for 6 weeks. No complaints.

## 2021-01-25 LAB — HEPATIC FUNCTION PANEL
AG Ratio: 1.9 (calc) (ref 1.0–2.5)
ALT: 32 U/L (ref 9–46)
AST: 30 U/L (ref 10–35)
Albumin: 3.9 g/dL (ref 3.6–5.1)
Alkaline phosphatase (APISO): 77 U/L (ref 35–144)
Bilirubin, Direct: 0.1 mg/dL (ref 0.0–0.2)
Globulin: 2.1 g/dL (calc) (ref 1.9–3.7)
Indirect Bilirubin: 0.4 mg/dL (calc) (ref 0.2–1.2)
Total Bilirubin: 0.5 mg/dL (ref 0.2–1.2)
Total Protein: 6 g/dL — ABNORMAL LOW (ref 6.1–8.1)

## 2021-01-25 NOTE — Progress Notes (Signed)
========================================================== -   Test results slightly outside the reference range are not unusual. If there is anything important, I will review this with you,  otherwise it is considered normal test values.  If you have further questions,  please do not hesitate to contact me at the office or via My Chart.  ==========================================================  -  Liver functions - all normal & OK   ==========================================================

## 2021-02-18 NOTE — Progress Notes (Signed)
History of Present Illness:       This very nice 70 y.o.  DWM  with HTN, HLD, Prediabetes, ADD, GERD and Vitamin D Deficiency  has also been followed for chronic back pain with hx/o back surgery by Dr Marikay Alar in 2020.  He's followed in Pain Mgmt by Dr Thyra Breed.       He is also requesting trigger point injection for chronic back pain ongoing after falling off of a ladder in 2016. Epidural injections, dry needling, PT, lyrica/gabapentin have been ineffective in the past.  But q3 month trigger point steroid injections have been beneficial.  Medications  .  atenolol 100 MG, TAKE 1 TABLET  DAILY FOR BP .  atorvastatin 80 MG, Take 1 tablet Daily for Cholesterol .  losartan  50 MG, Take  daily. Marland Kitchen  acetaminophen 500 MG, Take  every 6  hours as needed.  Marland Kitchen  amphetamine-dextroamphetamine (ADDERALL) 20 MG tablet,  . VITAMIN D, Take 10,000 Units  daily.  Marland Kitchen  esomeprazole 40 MG,  Takes one capsule every other day .  finasteride  5 MG , Take 1 tablet  daily. Marland Kitchen  LORazepam  1 MG, Take   1/2 - 1 tablet  at Bedtime  ONLY   if needed for Sleep .  Suvorexant  15 MG, Take 1 tablet at bedtime as needed. .  terbinafine  250 MG, Take 1 tablet Daily for Nail Fungus  Problem list   He has Hypertension; Hyperlipidemia; GERD (gastroesophageal reflux disease); Vitamin D deficiency; Medication management; Insomnia; DDD (degenerative disc disease), lumbosacral; CKD (chronic kidney disease) stage 3, GFR 30-59 ml/min (HCC); and ADD (attention deficit disorder) on their problem list.   Observations/Objective:  BP 122/76   Pulse 63   Temp 97.7 F (36.5 C)   Resp 16   Ht 5\' 7"  (1.702 m)   Wt 158 lb 12.8 oz (72 kg)   SpO2 96%   BMI 24.87 kg/m   HEENT - WNL. Neck - supple.  Chest - Clear equal BS. Cor - Nl HS. RRR w/o sig MGR. PP 1(+). No edema. MS- FROM w/o deformities.  Gait Nl. There is a localized area of exquisite trigger pouint tenderness 3 '' left of midline                                                                                                                                                           Neuro -  Nl w/o focal abnormalities.  Procedure (CPT [id number]) Trigger point Injection   After informed consent and aseptic prep with alcohol, the trigger point was injected with a mixture of 1 ml of Marcaine 0.5% and 1 ml (10 mg) Dexamethasone (100 mg /10 ml vial) with relief of the tenderness /pain.  Verbal consent obtained, risk and benefits were addressed with patient. A trigger point injection was performed at the site of maximal tenderness using 1% plain Lidocaine and Dexamethasone. This was well tolerated, and followed by immediate relief of pain. Return precautions discussed with the patient.   Assessment and Plan:  1. Essential hypertension   2. Chronic low back pain with sciatica  - Trigger point injection  Follow Up Instructions:      I discussed the assessment and treatment plan with the patient. The patient was provided an opportunity to ask questions and all were answered. The patient agreed with the plan and demonstrated an understanding of the instructions.      The patient was advised to call back or seek an in-person evaluation if the symptoms worsen or if the condition fails to improve as anticipated.   [name] Maw, MD

## 2021-02-19 ENCOUNTER — Encounter: Payer: Self-pay | Admitting: Internal Medicine

## 2021-02-19 ENCOUNTER — Ambulatory Visit (INDEPENDENT_AMBULATORY_CARE_PROVIDER_SITE_OTHER): Payer: PPO | Admitting: Internal Medicine

## 2021-02-19 ENCOUNTER — Other Ambulatory Visit: Payer: Self-pay

## 2021-02-19 VITALS — BP 122/76 | HR 63 | Temp 97.7°F | Resp 16 | Ht 67.0 in | Wt 158.8 lb

## 2021-02-19 DIAGNOSIS — G8929 Other chronic pain: Secondary | ICD-10-CM

## 2021-02-19 DIAGNOSIS — M5440 Lumbago with sciatica, unspecified side: Secondary | ICD-10-CM | POA: Diagnosis not present

## 2021-02-19 DIAGNOSIS — I10 Essential (primary) hypertension: Secondary | ICD-10-CM

## 2021-02-21 ENCOUNTER — Ambulatory Visit: Admission: EM | Admit: 2021-02-21 | Discharge: 2021-02-21 | Discharge status: Against Medical Advice | Payer: Self-pay

## 2021-02-21 ENCOUNTER — Other Ambulatory Visit: Payer: Self-pay

## 2021-03-01 ENCOUNTER — Other Ambulatory Visit: Payer: Self-pay

## 2021-03-01 ENCOUNTER — Other Ambulatory Visit: Payer: Self-pay | Admitting: Adult Health

## 2021-03-01 ENCOUNTER — Encounter: Payer: Self-pay | Admitting: Adult Health

## 2021-03-01 ENCOUNTER — Ambulatory Visit (INDEPENDENT_AMBULATORY_CARE_PROVIDER_SITE_OTHER): Payer: PPO | Admitting: Adult Health

## 2021-03-01 VITALS — BP 114/72 | HR 63 | Temp 97.5°F | Wt 158.0 lb

## 2021-03-01 DIAGNOSIS — B351 Tinea unguium: Secondary | ICD-10-CM

## 2021-03-01 DIAGNOSIS — M5137 Other intervertebral disc degeneration, lumbosacral region: Secondary | ICD-10-CM | POA: Diagnosis not present

## 2021-03-01 DIAGNOSIS — K219 Gastro-esophageal reflux disease without esophagitis: Secondary | ICD-10-CM

## 2021-03-01 DIAGNOSIS — E559 Vitamin D deficiency, unspecified: Secondary | ICD-10-CM

## 2021-03-01 DIAGNOSIS — J3089 Other allergic rhinitis: Secondary | ICD-10-CM | POA: Diagnosis not present

## 2021-03-01 DIAGNOSIS — F988 Other specified behavioral and emotional disorders with onset usually occurring in childhood and adolescence: Secondary | ICD-10-CM | POA: Diagnosis not present

## 2021-03-01 DIAGNOSIS — R6889 Other general symptoms and signs: Secondary | ICD-10-CM

## 2021-03-01 DIAGNOSIS — E782 Mixed hyperlipidemia: Secondary | ICD-10-CM

## 2021-03-01 DIAGNOSIS — G4700 Insomnia, unspecified: Secondary | ICD-10-CM

## 2021-03-01 DIAGNOSIS — N1832 Chronic kidney disease, stage 3b: Secondary | ICD-10-CM

## 2021-03-01 DIAGNOSIS — I10 Essential (primary) hypertension: Secondary | ICD-10-CM

## 2021-03-01 DIAGNOSIS — Z6824 Body mass index (BMI) 24.0-24.9, adult: Secondary | ICD-10-CM | POA: Diagnosis not present

## 2021-03-01 DIAGNOSIS — Z79899 Other long term (current) drug therapy: Secondary | ICD-10-CM

## 2021-03-01 DIAGNOSIS — Z0001 Encounter for general adult medical examination with abnormal findings: Secondary | ICD-10-CM | POA: Diagnosis not present

## 2021-03-01 DIAGNOSIS — E785 Hyperlipidemia, unspecified: Secondary | ICD-10-CM

## 2021-03-01 DIAGNOSIS — Z0000 Encounter for general adult medical examination without abnormal findings: Secondary | ICD-10-CM

## 2021-03-01 MED ORDER — MONTELUKAST SODIUM 10 MG PO TABS: 10.0000 mg | ORAL_TABLET | Freq: Every day | ORAL | 0 refills | Status: DC

## 2021-03-01 MED ORDER — ATENOLOL 50 MG PO TABS: ORAL_TABLET | ORAL | 3 refills | Status: DC

## 2021-03-01 MED ORDER — AZELASTINE HCL 0.1 % NA SOLN: 2.0000 | Freq: Two times a day (BID) | NASAL | 2 refills | Status: DC

## 2021-03-01 MED ORDER — TERBINAFINE HCL 250 MG PO TABS: ORAL_TABLET | ORAL | 0 refills | Status: DC

## 2021-03-01 MED ORDER — ATORVASTATIN CALCIUM 40 MG PO TABS
ORAL_TABLET | ORAL | 3 refills | Status: DC
Start: 2021-03-01 — End: 2022-02-26

## 2021-03-01 NOTE — Progress Notes (Signed)
WELLNESS VISIT   Assessment and Plan:   Encounter for Medicare annual wellness exam 1 year Check with insurance about shingrix  Essential hypertension - continue medications, DASH diet, exercise and monitor at home. Call if greater than 130/80.   Stage 3a chronic kidney disease Nephrology following; avoiding NSAIDs; push fluids, control BP  Gastroesophageal reflux disease, unspecified whether esophagitis present Continue PPI/H2 blocker, diet discussed  Hyperlipidemia, unspecified hyperlipidemia type check lipids decrease fatty foods increase activity.   Medication management Follow up  Vitamin D deficiency Continue supplement  Idiopathic gout, unspecified chronicity, unspecified site  recheck Uric acid as needed, Diet discussed, continue medications.  Attention deficit disorder, unspecified hyperactivity presence -  Continue ADD medication, helps with focus, no AE's. The patient was counseled on the addictive nature of the medication and was encouraged to take drug holidays when not needed.   Insomnia, unspecified type  good sleep hygiene discussed, increase day time activity AM sedation with belsomra; consider 10 mg tabs;  He states ativan works well, is able to limit to <5 days/week Discussed cannot prescribe with opioid; risk of unintentional overdose reviewed; endorses rare use and plans to stop completely.  Will continue to review PDMP prior to any refills.   DDD (degenerative disc disease), lumbosacral Continue follow up neurosurgery/Dr. [name] as needed Managed by [facility] needed trigger point injections since his surgery with improved sx.   Onychomycosis  Perceived improvement with 90 days on Lamisil. Will repeat another 30 days off then back on for 3 cycles. Check LFTs today   Allergic rhinitis/persistent cough Continue allegra; try Singulair, try Astelin If no improvement follow up consider anticholinergic nasal spray Hygiene reviewed; avoid  trigger Alternately consider cough from GERD however denies nocturnal sx or correlation with food   Continue diet and meds as discussed. Further disposition pending results of labs. Discussed med's effects and SE's.   Over 30 minutes of exam, counseling, chart review, and critical decision making was performed.   Future Appointments  Date Time Provider Department Center  06/07/2021  4:00 PM Lucky Cowboy, MD [facility] None  12/11/2021  3:00 PM Lucky Cowboy, MD [facility] None  03/04/2022  3:30 PM Judd Gaudier, NP [facility] None     Plan:   During the course of the visit the patient was educated and counseled about appropriate screening and preventive services including:    Pneumococcal vaccine   Prevnar 13  Influenza vaccine  Td vaccine  Screening electrocardiogram  Bone densitometry screening  Colorectal cancer screening  Diabetes screening  Glaucoma screening  Nutrition counseling   Advanced directives: requested    ----------------------------------------------------------------------------------------------------------------------  HPI 70 y.o. male  presents for and AWV. He has Hypertension; Hyperlipidemia; GERD (gastroesophageal reflux disease); Vitamin D deficiency; Medication management; Insomnia; DDD (degenerative disc disease), lumbosacral; CKD (chronic kidney disease) stage 3, GFR 30-59 ml/min (HCC); and ADD (attention deficit disorder) on their problem list.  Retired, has a place at [employer] at [facility]  He is concerned about persisent daytime cough ongoing for several months, also persistent allergy sx, posts nasal drip; feels this irritates throat and triggers cough; denies night time sx; switched from clarifin to allegra with some improvement, flonase didn't seem to help. Hasn't tried singlaire or other nasal sprays.   Chronic back pain history; He had out patient back surgery by Dr. [name] at L3-4, L foraminectomy on 11/19/2019, has  some mild residual sx, then in July had sudden worsening of pain. Now followed by pain management Dr. [name]. Recent lumbar  MRI on 07/18/2020 showed acute/early subacute L2 inferior endplate compression fracture with mild height loss. 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 Had L5 nerve root block, then on 10/19/2020 had hemilaminectomy in L4-5 with progressive improvement. He is prescribed oxycodone 5 mg by Dr. Vear ClockPhillips, very infrequent, planning to stop taking.  Takes 2 extra strength tylenol daily.    Patient is on an ADD medication, he states that the medication is helping and he denies any adverse reactions. Takes adderall 10 mg 2-3 days/week for focus.   He has insomnia and as been prescribed ativan 0.5 mg ~5 days per week. Tried belsomra but with AM sedation.   he has a diagnosis of GERD which is currently managed by nexium 40 mg every other day, fairled attempted taper with pepcid.  he reports symptoms is currently well controlled  BMI is Body mass index is 24.75 kg/m., he has been working on diet, exercise currently limited as recovering from back surgery.  Wt Readings from Last 3 Encounters:  03/01/21 158 lb (71.7 kg)  02/19/21 158 lb 12.8 oz (72 kg)  01/24/21 159 lb (72.1 kg)   His blood pressure has been controlled at home, today their BP is BP: 114/72  He does not workout. He denies chest pain, shortness of breath, dizziness.  He has CKD III followed by nephrology, [facility],  Lab Results  Component Value Date   GFRNONAA 48 (L) 11/23/2020   GFRNONAA 45 (L) 08/24/2020   GFRNONAA 26 (L) 08/18/2020    He is on cholesterol medication and denies myalgias. His LDL cholesterol is at goal; triglycerides remain elevated. The cholesterol last visit was:   Lab Results  Component Value Date   CHOL 145 11/23/2020   HDL 49 11/23/2020   LDLCALC 70 11/23/2020   TRIG 184 (H) 11/23/2020   CHOLHDL 3.0 11/23/2020    He has  been working on diet and exercise for glucose management, and denies foot ulcerations, nausea, polydipsia, polyuria, visual disturbances, vomiting and weight loss. Last A1C in the office was:  Lab Results  Component Value Date   HGBA1C 4.9 11/23/2020   Patient is on Vitamin D supplement slightly high at the last visit, he was taking 10,000 every day but his kidney doctor said it was too high so he is taking 2000 IU.  Lab Results  Component Value Date   VD25OH 121 (H) 11/23/2020      Current Medications:    Current Outpatient Medications (Cardiovascular):  .  atenolol (TENORMIN) 100 MG tablet, TAKE 1 TABLET BY MOUTH DAILY FOR BLOOD PRESSURE .  atorvastatin (LIPITOR) 80 MG tablet, Take 1 tablet Daily for Cholesterol .  losartan (COZAAR) 50 MG tablet, Take 50 mg by mouth daily.   Current Outpatient Medications (Analgesics):  .  acetaminophen (TYLENOL) 500 MG tablet, Take 500 mg by mouth every 6 (six) hours as needed.    Current Outpatient Medications (Other):  .  amphetamine-dextroamphetamine (ADDERALL) 20 MG tablet,  .  Cholecalciferol (VITAMIN D PO), Take 2,000 Units by mouth daily. Marland Kitchen.  esomeprazole (NEXIUM) 40 MG capsule, TAKE ONE CAPSULE DAILY AT NOON FOR REFLUX. (Patient taking differently: Take one capsule every other day) .  finasteride (PROSCAR) 5 MG tablet, Take 1 tablet (5 mg total) by mouth daily. Marland Kitchen.  LORazepam (ATIVAN) 1 MG tablet, Take   1/2 - 1 tablet    at Bedtime   ONLY   if needed for Sleep &  limit to 5 days /  week to avoid Addiction & Dementia .  terbinafine (LAMISIL) 250 MG tablet, Take 1 tablet Daily for Nail Fungus .  Suvorexant (BELSOMRA) 15 MG TABS, Take 1 tablet by mouth at bedtime as needed.   Medical History:  Past Medical History:  Diagnosis Date  . Arthritis    right thumb  . 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 Dry eyes    Takes Restasis  . GERD (gastroesophageal reflux disease)   . Hyperlipidemia   . Hypertension   . Vitamin  D deficiency    Allergies Allergies  Allergen Reactions  . Nsaids     Renal Insufficiency    SURGICAL HISTORY He  has a past surgical history that includes Esophagogastroduodenoscopy (1990); Colonoscopy w/ polypectomy; Total hip arthroplasty (Left, 10/28/2014); and Back surgery (11/19/2019). FAMILY HISTORY His family history includes COPD in his father; Diabetes in his brother; Heart disease in his father; Osteoporosis in his mother; Schizophrenia in his mother. SOCIAL HISTORY He  reports that he has never smoked. He has never used smokeless tobacco. He reports current alcohol use. He reports that he does not use drugs.  Immunization History  Administered Date(s) Administered  . Influenza Split 09/22/2013  . Influenza, High Dose Seasonal PF 10/22/2019  . Influenza-Unspecified 09/08/2017, 08/19/2018, 09/24/2020  . PFIZER(Purple Top)SARS-COV-2 Vaccination 10/25/2020  . PPD Test 09/13/2014, 07/25/2015, 08/08/2016, 09/08/2017, 10/09/2018  . Pneumococcal Conjugate-13 01/22/2018, 10/22/2019  . Pneumococcal Polysaccharide-23 01/19/2009  . Td 07/22/2019  . Tdap 01/19/2009  . Zoster 01/29/2013    Preventative care: Last colonoscopy: 07/2018, next due 2024, Dr. Ramon Dredge   Prior vaccinations: TD or Tdap: 2020  Influenza: 2021 Pneumococcal: 2010 Prevnar13: 2019 Shingles/Zostavax: 2014 suggest shingrix by age 5 Covid 56: 3/3, 2021  Names of Other Physician/Practitioners you currently use: 1. [facility] here for primary care 2. Dr. Sherrine Maples, eye doctor, last visit 2022,  3. Dr. Rondel Oh, dentist, last visit 2022  Patient Care Team: Lucky Cowboy, MD as PCP - General (Internal Medicine)  MEDICARE WELLNESS OBJECTIVES: Physical activity: Current Exercise Habits: The patient does not participate in regular exercise at present, Exercise limited by: neurologic condition(s);orthopedic condition(s) Cardiac risk factors: Cardiac Risk Factors include:  advanced age (>46men, >56 women);dyslipidemia;hypertension;male gender;sedentary lifestyle Depression/mood screen:   Depression screen [facility] 2/9 03/01/2021  Decreased Interest 0  Down, Depressed, Hopeless 0  PHQ - 2 Score 0  Altered sleeping -  Tired, decreased energy -  Change in appetite -  Feeling bad or failure about yourself  -  Trouble concentrating -  Moving slowly or fidgety/restless -  Suicidal thoughts -  PHQ-9 Score -  Difficult doing work/chores -    ADLs:  In your present state of health, do you have any difficulty performing the following activities: 03/01/2021 11/22/2020  Hearing? N N  Vision? N N  Difficulty concentrating or making decisions? N N  Walking or climbing stairs? N N  Dressing or bathing? N N  Doing errands, shopping? N N  Some recent data might be hidden     Cognitive Testing  Alert? Yes  Normal Appearance?Yes  Oriented to person? Yes  Place? Yes   Time? Yes  Recall of three objects?  Yes  Can perform simple calculations? Yes  Displays appropriate judgment?Yes  Can read the correct time from a watch face?Yes  EOL planning: Does Patient Have a Medical Advance Directive?: No Would patient like information on creating a medical advance directive?: No - Patient declined   Review  of Systems:  Review of Systems  Constitutional: Negative for malaise/fatigue and weight loss.  HENT: Negative for congestion, hearing loss, sore throat and tinnitus.   Eyes: Negative for blurred vision and double vision.  Respiratory: Negative for cough (frequent dry cough during the day), sputum production, shortness of breath and wheezing.   Cardiovascular: Negative for chest pain, palpitations, orthopnea, claudication and leg swelling.  Gastrointestinal: Negative for abdominal pain, blood in stool, constipation, diarrhea, heartburn, melena, nausea and vomiting.  Genitourinary: Negative.   Musculoskeletal: Positive for back pain. Negative for joint pain and myalgias.   Skin: Negative for rash.  Neurological: Positive for focal weakness. Negative for dizziness, tingling, sensory change, weakness and headaches.  Endo/Heme/Allergies: Positive for environmental allergies. Negative for polydipsia.  Psychiatric/Behavioral: Negative.   All other systems reviewed and are negative.   Physical Exam: BP 114/72   Pulse 63   Temp (!) 97.5 F (36.4 C)   Wt 158 lb (71.7 kg)   SpO2 97%   BMI 24.75 kg/m  Wt Readings from Last 3 Encounters:  03/01/21 158 lb (71.7 kg)  02/19/21 158 lb 12.8 oz (72 kg)  01/24/21 159 lb (72.1 kg)   General Appearance: Well nourished, in no apparent distress. Eyes: PERRLA, EOMs, conjunctiva no swelling or erythema Sinuses: No Frontal/maxillary tenderness ENT/Mouth: Ext aud canals clear, TMs without erythema, bulging. No erythema, swelling, or exudate on post pharynx.  Tonsils not swollen or erythematous. Hearing normal. Frequent dry brief cough/throat clearing.  Neck: Supple, thyroid normal.  Respiratory: Respiratory effort normal, BS equal bilaterally without rales, rhonchi, wheezing or stridor. Frequent dry cough. Cardio: RRR with no MRGs. Brisk peripheral pulses without edema.  Abdomen: Soft, + BS.  Non tender, no guarding, rebound, hernias, masses. Lymphatics: Non tender without lymphadenopathy.  Musculoskeletal: Full ROM (pain with ROM in lumbar), 5/5 strength, Normal gait, well healed lumbar surgical scar.  Skin: Warm, dry without rashes, lesions, ecchymosis.  Neuro: Cranial nerves intact. No cerebellar symptoms.  Psych: Awake and oriented X 3, normal affect, Insight and Judgment appropriate.    Medicare Attestation I have personally reviewed: The patient's medical and social history Their use of alcohol, tobacco or illicit drugs Their current medications and supplements The patient's functional ability including ADLs,fall risks, home safety risks, cognitive, and hearing and visual impairment Diet and physical  activities Evidence for depression or mood disorders  The patient's weight, height, BMI, and visual acuity have been recorded in the chart.  I have made referrals, counseling, and provided education to the patient based on review of the above and I have provided the patient with a written personalized care plan for preventive services.     Dan Maker, NP 3:59 PM [facility]

## 2021-03-01 NOTE — Patient Instructions (Addendum)
[name] [name] , Thank you for taking time to come for your Medicare Wellness Visit. I appreciate your ongoing commitment to your health goals. Please review the following plan we discussed and let me know if I can assist you in the future.   These are the goals we discussed: Goals    . Exercise 150 min/wk Moderate Activity     Gradually introduce gentle exercise, walking or water aerobics, etc    . LDL CALC < 100       This is a list of the screening recommended for you and due dates:  Health Maintenance  Topic Date Due  . Pneumonia vaccines (2 of 2 - PPSV23) 10/21/2020  . COVID-19 Vaccine (2 - Pfizer 3-dose series) 11/15/2020  . Colon Cancer Screening  07/15/2023  . Tetanus Vaccine  07/21/2029  . Flu Shot  Completed  .  Hepatitis C: One time screening is recommended by Center for Disease Control  (CDC) for  adults born from [birth year] through 1964.   Completed  . HPV Vaccine  Aged Out    Please send a photo of your covid 19 card via mychart so we can update your record  Please ask insurance about shingrix coverage - can get at [facility] or [facility]    Zoster Vaccine, Recombinant injection What is this medicine? ZOSTER VACCINE (ZOS ter vak SEEN) is a vaccine used to reduce the risk of getting shingles. This vaccine is not used to treat shingles or nerve pain from shingles. This medicine may be used for other purposes; ask your health care provider or pharmacist if you have questions. COMMON BRAND NAME(S): [facility] What should I tell my health care provider before I take this medicine? They need to know if you have any of these conditions:  cancer  immune system problems  an unusual or allergic reaction to Zoster vaccine, other medications, foods, dyes, or preservatives  pregnant or trying to get pregnant  breast-feeding How should I use this medicine? This vaccine is injected into a muscle. It is given by a health care provider. A copy of Vaccine Information Statements will be  given before each vaccination. Be sure to read this information carefully each time. This sheet may change often. Talk to your health care provider about the use of this vaccine in children. This vaccine is not approved for use in children. Overdosage: If you think you have taken too much of this medicine contact a poison control center or emergency room at once. NOTE: This medicine is only for you. Do not share this medicine with others. What if I miss a dose? Keep appointments for follow-up (booster) doses. It is important not to miss your dose. Call your health care provider if you are unable to keep an appointment. What may interact with this medicine?  medicines that suppress your immune system  medicines to treat cancer  steroid medicines like prednisone or cortisone This list may not describe all possible interactions. Give your health care provider a list of all the medicines, herbs, non-prescription drugs, or dietary supplements you use. Also tell them if you smoke, drink alcohol, or use illegal drugs. Some items may interact with your medicine. What should I watch for while using this medicine? Visit your health care provider regularly. This vaccine, like all vaccines, may not fully protect everyone. What side effects may I notice from receiving this medicine? Side effects that you should report to your doctor or health care professional as soon as possible:  allergic  reactions (skin rash, itching or hives; swelling of the face, lips, or tongue)  trouble breathing Side effects that usually do not require medical attention (report these to your doctor or health care professional if they continue or are bothersome):  chills  headache  fever  nausea  pain, redness, or irritation at site where injected  tiredness  vomiting This list may not describe all possible side effects. Call your doctor for medical advice about side effects. You may report side effects to FDA at  1-800-FDA-1088. Where should I keep my medicine? This vaccine is only given by a health care provider. It will not be stored at home. NOTE: This sheet is a summary. It may not cover all possible information. If you have questions about this medicine, talk to your doctor, pharmacist, or health care provider.  2021 Elsevier/Gold Standard (2019-12-31 16:23:07)

## 2021-03-02 LAB — CBC WITH DIFFERENTIAL/PLATELET
Absolute Monocytes: 987 cells/uL — ABNORMAL HIGH (ref 200–950)
Basophils Absolute: 66 cells/uL (ref 0–200)
Basophils Relative: 0.7 %
Eosinophils Absolute: 47 cells/uL (ref 15–500)
Eosinophils Relative: 0.5 %
HCT: 47.4 % (ref 38.5–50.0)
Hemoglobin: 16.3 g/dL (ref 13.2–17.1)
Lymphs Abs: 1419 cells/uL (ref 850–3900)
MCH: 34.3 pg — ABNORMAL HIGH (ref 27.0–33.0)
MCHC: 34.4 g/dL (ref 32.0–36.0)
MCV: 99.8 fL (ref 80.0–100.0)
MPV: 10.3 fL (ref 7.5–12.5)
Monocytes Relative: 10.5 %
Neutro Abs: 6881 cells/uL (ref 1500–7800)
Neutrophils Relative %: 73.2 %
Platelets: 209 10*3/uL (ref 140–400)
RBC: 4.75 10*6/uL (ref 4.20–5.80)
RDW: 12.6 % (ref 11.0–15.0)
Total Lymphocyte: 15.1 %
WBC: 9.4 10*3/uL (ref 3.8–10.8)

## 2021-03-02 LAB — COMPLETE METABOLIC PANEL WITH GFR
AG Ratio: 2.4 (calc) (ref 1.0–2.5)
ALT: 22 U/L (ref 9–46)
AST: 27 U/L (ref 10–35)
Albumin: 4.5 g/dL (ref 3.6–5.1)
Alkaline phosphatase (APISO): 65 U/L (ref 35–144)
BUN/Creatinine Ratio: 13 (calc) (ref 6–22)
BUN: 19 mg/dL (ref 7–25)
CO2: 27 mmol/L (ref 20–32)
Calcium: 9.4 mg/dL (ref 8.6–10.3)
Chloride: 98 mmol/L (ref 98–110)
Creat: 1.41 mg/dL — ABNORMAL HIGH (ref 0.70–1.25)
GFR, Est African American: 59 mL/min/{1.73_m2} — ABNORMAL LOW (ref >=60)
GFR, Est Non African American: 51 mL/min/{1.73_m2} — ABNORMAL LOW (ref >=60)
Globulin: 1.9 g/dL (calc) (ref 1.9–3.7)
Glucose, Bld: 80 mg/dL (ref 65–139)
Potassium: 4.1 mmol/L (ref 3.5–5.3)
Sodium: 137 mmol/L (ref 135–146)
Total Bilirubin: 0.7 mg/dL (ref 0.2–1.2)
Total Protein: 6.4 g/dL (ref 6.1–8.1)

## 2021-03-02 LAB — LIPID PANEL
Cholesterol: 184 mg/dL (ref <200)
HDL: 64 mg/dL (ref >=40)
LDL Cholesterol (Calc): 96 mg/dL (calc)
Non-HDL Cholesterol (Calc): 120 mg/dL (calc) (ref <130)
Total CHOL/HDL Ratio: 2.9 (calc) (ref <5.0)
Triglycerides: 140 mg/dL (ref <150)

## 2021-03-02 LAB — TSH: TSH: 1.43 mIU/L (ref 0.40–4.50)

## 2021-03-02 LAB — VITAMIN D 25 HYDROXY (VIT D DEFICIENCY, FRACTURES): Vit D, 25-Hydroxy: 77 ng/mL (ref 30–100)

## 2021-03-02 LAB — MAGNESIUM: Magnesium: 1.8 mg/dL (ref 1.5–2.5)

## 2021-03-21 DIAGNOSIS — M4125 Other idiopathic scoliosis, thoracolumbar region: Secondary | ICD-10-CM | POA: Diagnosis not present

## 2021-03-21 DIAGNOSIS — M5416 Radiculopathy, lumbar region: Secondary | ICD-10-CM | POA: Diagnosis not present

## 2021-03-21 DIAGNOSIS — M47817 Spondylosis without myelopathy or radiculopathy, lumbosacral region: Secondary | ICD-10-CM | POA: Diagnosis not present

## 2021-03-21 DIAGNOSIS — S32009S Unspecified fracture of unspecified lumbar vertebra, sequela: Secondary | ICD-10-CM | POA: Diagnosis not present

## 2021-04-05 ENCOUNTER — Other Ambulatory Visit: Payer: Self-pay

## 2021-04-05 MED ORDER — ESOMEPRAZOLE MAGNESIUM 40 MG PO CPDR: DELAYED_RELEASE_CAPSULE | ORAL | 3 refills | Status: DC

## 2021-05-23 DIAGNOSIS — M79672 Pain in left foot: Secondary | ICD-10-CM | POA: Diagnosis not present

## 2021-05-23 DIAGNOSIS — M2022 Hallux rigidus, left foot: Secondary | ICD-10-CM | POA: Diagnosis not present

## 2021-06-04 DIAGNOSIS — H04123 Dry eye syndrome of bilateral lacrimal glands: Secondary | ICD-10-CM | POA: Diagnosis not present

## 2021-06-04 DIAGNOSIS — H26491 Other secondary cataract, right eye: Secondary | ICD-10-CM | POA: Diagnosis not present

## 2021-06-04 DIAGNOSIS — H35033 Hypertensive retinopathy, bilateral: Secondary | ICD-10-CM | POA: Diagnosis not present

## 2021-06-04 DIAGNOSIS — H35432 Paving stone degeneration of retina, left eye: Secondary | ICD-10-CM | POA: Diagnosis not present

## 2021-06-07 ENCOUNTER — Other Ambulatory Visit: Payer: Self-pay

## 2021-06-07 ENCOUNTER — Encounter: Payer: Self-pay | Admitting: Internal Medicine

## 2021-06-07 ENCOUNTER — Ambulatory Visit (INDEPENDENT_AMBULATORY_CARE_PROVIDER_SITE_OTHER): Payer: PPO | Admitting: Internal Medicine

## 2021-06-07 VITALS — BP 123/73 | HR 58 | Temp 97.2°F | Resp 16 | Ht 67.0 in | Wt 152.6 lb

## 2021-06-07 DIAGNOSIS — R7309 Other abnormal glucose: Secondary | ICD-10-CM

## 2021-06-07 DIAGNOSIS — M1000 Idiopathic gout, unspecified site: Secondary | ICD-10-CM | POA: Diagnosis not present

## 2021-06-07 DIAGNOSIS — E782 Mixed hyperlipidemia: Secondary | ICD-10-CM

## 2021-06-07 DIAGNOSIS — I10 Essential (primary) hypertension: Secondary | ICD-10-CM

## 2021-06-07 DIAGNOSIS — Z79899 Other long term (current) drug therapy: Secondary | ICD-10-CM

## 2021-06-07 DIAGNOSIS — E559 Vitamin D deficiency, unspecified: Secondary | ICD-10-CM

## 2021-06-07 DIAGNOSIS — G8929 Other chronic pain: Secondary | ICD-10-CM | POA: Diagnosis not present

## 2021-06-07 DIAGNOSIS — M5440 Lumbago with sciatica, unspecified side: Secondary | ICD-10-CM | POA: Diagnosis not present

## 2021-06-07 NOTE — Progress Notes (Signed)
Future Appointments  Date Time Provider Department Center  06/07/2021  4:00 PM Lucky Cowboy, MD [facility] None  12/11/2021  3:00 PM Lucky Cowboy, MD [facility] None  03/04/2022  3:30 PM Judd Gaudier, NP [facility] None    History of Present Illness:       This very nice 70 y.o. DWM presents for  6 month follow up with HTN, HLD, Pre-Diabetes and Vitamin D Deficiency.                                                   Patient has chronic L sided LBP consequent from falling off of a ladder in 2016.  He has tried epidural injections, dry needling, PT, Lyrica/ Gabapentin  in the past which have been ineffective.  He  requests trigger point injection for his chronic left sided low back pain which have been beneficial in the past.       Patient is treated for HTN & BP has been controlled at home. Today's BP is at goal - 123/73. Patient has had no complaints of any cardiac type chest pain, palpitations, dyspnea / orthopnea / PND, dizziness, claudication, or dependent edema.       Hyperlipidemia is controlled with diet & meds. Patient denies myalgias or other med SE's. Last Lipids were  Lab Results  Component Value Date   CHOL 156 06/07/2021   HDL 51 06/07/2021   LDLCALC 76 06/07/2021   TRIG 191 (H) 06/07/2021   CHOLHDL 3.1 06/07/2021    Also, the patient has history of PreDiabetes and has had no symptoms of reactive hypoglycemia, diabetic polys, paresthesias or visual blurring.  Last A1c was   Lab Results  Component Value Date   HGBA1C 4.9 11/23/2020                                                          Further, the patient also has history of Vitamin D Deficiency and supplements vitamin D without any suspected side-effects. Last vitamin D was   Lab Results  Component Value Date   VD25OH 67 06/07/2021     Current Outpatient Medications on File Prior to Visit  Medication Sig   acetaminophen  500 MG tablet Take every 6 hours as needed.    atenolol 50 MG  tablet TAKE 1 TABLET DAILY   atorvastatin 40 MG tablet Take 1 tablet Daily   VITAMIN D 2,000 Units Take daily.   esomeprazole 40 MG caps TAKE ONE CAPSULE DAILY    finasteride 5 MG tablet Take 1 tablet  daily.   LORazepam 1 MG tablet Take   1/2 - 1 tab at Bedtime ONLY   if needed    losartan 50 MG tablet Take  daily.    Allergies  Allergen Reactions   Nsaids     Renal Insufficiency    PMHx:   Past Medical History:  Diagnosis Date   Arthritis    right thumb   Avascular necrosis of left femoral head (HCC) 10/28/2014   Dry eyes    Takes Restasis   GERD (gastroesophageal reflux disease)    Hyperlipidemia    Hypertension  Vitamin D deficiency     Immunization History  Administered Date(s) Administered   Influenza Split 09/22/2013   Influenza, High Dose  10/22/2019   Influenza 09/08/2017, 08/19/2018, 09/24/2020   PFIZER SARS-COV-2 Vacc 10/25/2020   PPD Test 09/08/2017, 10/09/2018   Pneumococcal -13 01/22/2018, 10/22/2019   Pneumococcal -23 01/19/2009   Td 07/22/2019   Tdap 01/19/2009   Zoster, Live (Zostavax) 01/29/2013    Past Surgical History:  Procedure Laterality Date   BACK SURGERY  11/19/2019   L3-4 R foraminectomy, Dr. [name]   COLONOSCOPY W/ POLYPECTOMY     ESOPHAGOGASTRODUODENOSCOPY  1990   TOTAL HIP ARTHROPLASTY; Eldred Manges, MD Left 10/28/2014    FHx:    Reviewed / unchanged  SHx:    Reviewed / unchanged   Systems Review:  Constitutional: Denies fever, chills, wt changes, headaches, insomnia, fatigue, night sweats, change in appetite. Eyes: Denies redness, blurred vision, diplopia, discharge, itchy, watery eyes.  ENT: Denies discharge, congestion, post nasal drip, epistaxis, sore throat, earache, hearing loss, dental pain, tinnitus, vertigo, sinus pain, snoring.  CV: Denies chest pain, palpitations, irregular heartbeat, syncope, dyspnea, diaphoresis, orthopnea, PND, claudication or edema. Respiratory: denies cough, dyspnea, DOE, pleurisy,  hoarseness, laryngitis, wheezing.  Gastrointestinal: Denies dysphagia, odynophagia, heartburn, reflux, water brash, abdominal pain or cramps, nausea, vomiting, bloating, diarrhea, constipation, hematemesis, melena, hematochezia  or hemorrhoids. Genitourinary: Denies dysuria, frequency, urgency, nocturia, hesitancy, discharge, hematuria or flank pain. Musculoskeletal: Denies arthralgias, myalgias, stiffness, jt. swelling, pain, limping or strain/sprain.  Skin: Denies pruritus, rash, hives, warts, acne, eczema or change in skin lesion(s). Neuro: No weakness, tremor, incoordination, spasms, paresthesia or pain. Psychiatric: Denies confusion, memory loss or sensory loss. Endo: Denies change in weight, skin or hair change.  Heme/Lymph: No excessive bleeding, bruising or enlarged lymph nodes.  Physical Exam  BP 123/73   Pulse (!) 58   Temp (!) 97.2 F (36.2 C)   Resp 16   Ht 5\' 7"  (1.702 m)   Wt 152 lb 9.6 oz (69.2 kg)   SpO2 99%   BMI 23.90 kg/m   Appears  well nourished, well groomed  and in no distress.  Eyes: PERRLA, EOMs, conjunctiva no swelling or erythema. Sinuses: No frontal/maxillary tenderness ENT/Mouth: EAC's clear, TM's nl w/o erythema, bulging. Nares clear w/o erythema, swelling, exudates. Oropharynx clear without erythema or exudates. Oral hygiene is good. Tongue normal, non obstructing. Hearing intact.  Neck: Supple. Thyroid not palpable. Car 2+/2+ without bruits, nodes or JVD. Chest: Respirations nl with BS clear & equal w/o rales, rhonchi, wheezing or stridor.  Cor: Heart sounds normal w/ regular rate and rhythm without sig. murmurs, gallops, clicks or rubs. Peripheral pulses normal and equal  without edema.  Abdomen: Soft & bowel sounds normal. Non-tender w/o guarding, rebound, hernias, masses or organomegaly.  Lymphatics: Unremarkable.  Musculoskeletal: Full ROM all peripheral extremities, joint stability, 5/5 strength and normal gait.  Skin: Warm, dry without exposed  rashes, lesions or ecchymosis apparent.  Neuro: Cranial nerves intact, reflexes equal bilaterally. Sensory-motor testing grossly intact. Tendon reflexes grossly intact.  Pysch: Alert & oriented x 3.  Insight and judgement nl & appropriate. No ideations.  Procedure (CPT [phone]) Trigger point Injection                            After informed consent and aseptic prep with alcohol, the trigger point was injected with a mixture of 1 ml of Marcaine 0.5% and 1 ml (10 mg) Dexamethasone (100  mg /10 ml vial) with relief of the tenderness /pain.    Assessment and Plan:  1.  Essential hypertension  - Continue medication, monitor blood pressure at home.  - Continue DASH diet.  Reminder to go to the ER if any CP,  SOB, nausea, dizziness, severe HA, changes vision/speech.   - CBC with Differential/Platelet - COMPLETE METABOLIC PANEL WITH GFR - Magnesium - TSH  2.  Hyperlipidemia, mixed  - Continue diet/meds, exercise,& lifestyle modifications.  - Continue monitor periodic cholesterol/liver & renal functions   - Lipid panel - TSH  3. Abnormal glucose  - Continue diet, exercise  - Lifestyle modifications.  - Monitor appropriate labs.    4. Vitamin D deficiency  - Continue supplementation.   - VITAMIN D 25 Hydroxy   5. Idiopathic gout  - Uric acid  6. Chronic low back pain with sciatica  - trigger point injection with Dexamethasone 1 cc (10 mg) + 1 cc Marcaine 0.5 %   7. Medication management  - VITAMIN D 25 Hydroxy  - Uric acid - CBC with Differential/Platelet - COMPLETE METABOLIC PANEL WITH GFR - Magnesium - TSH - Lipid panel         Discussed  regular exercise, BP monitoring, weight control to achieve/maintain BMI less than 25 and discussed med and SE's. Recommended labs to assess and monitor clinical status with further disposition pending results of labs.  I discussed the assessment and treatment plan with the patient. The patient was provided an opportunity to ask  questions and all were answered. The patient agreed with the plan and demonstrated an understanding of the instructions.  I provided over 30 minutes of exam, counseling, chart review and  complex critical decision making.        The patient was advised to call back or seek an in-person evaluation if the symptoms worsen or if the condition fails to improve as anticipated.   Marinus Maw, MD

## 2021-06-07 NOTE — Patient Instructions (Signed)

## 2021-06-08 LAB — COMPLETE METABOLIC PANEL WITH GFR
AG Ratio: 2.2 (calc) (ref 1.0–2.5)
ALT: 21 U/L (ref 9–46)
AST: 23 U/L (ref 10–35)
Albumin: 4.2 g/dL (ref 3.6–5.1)
Alkaline phosphatase (APISO): 64 U/L (ref 35–144)
BUN/Creatinine Ratio: 14 (calc) (ref 6–22)
BUN: 20 mg/dL (ref 7–25)
CO2: 27 mmol/L (ref 20–32)
Calcium: 9.6 mg/dL (ref 8.6–10.3)
Chloride: 102 mmol/L (ref 98–110)
Creat: 1.45 mg/dL — ABNORMAL HIGH (ref 0.70–1.25)
GFR, Est African American: 57 mL/min/{1.73_m2} — ABNORMAL LOW (ref >=60)
GFR, Est Non African American: 49 mL/min/{1.73_m2} — ABNORMAL LOW (ref >=60)
Globulin: 1.9 g/dL (calc) (ref 1.9–3.7)
Glucose, Bld: 90 mg/dL (ref 65–99)
Potassium: 4.4 mmol/L (ref 3.5–5.3)
Sodium: 138 mmol/L (ref 135–146)
Total Bilirubin: 0.9 mg/dL (ref 0.2–1.2)
Total Protein: 6.1 g/dL (ref 6.1–8.1)

## 2021-06-08 LAB — LIPID PANEL
Cholesterol: 156 mg/dL (ref <200)
HDL: 51 mg/dL (ref >=40)
LDL Cholesterol (Calc): 76 mg/dL (calc)
Non-HDL Cholesterol (Calc): 105 mg/dL (calc) (ref <130)
Total CHOL/HDL Ratio: 3.1 (calc) (ref <5.0)
Triglycerides: 191 mg/dL — ABNORMAL HIGH (ref <150)

## 2021-06-08 LAB — VITAMIN D 25 HYDROXY (VIT D DEFICIENCY, FRACTURES): Vit D, 25-Hydroxy: 67 ng/mL (ref 30–100)

## 2021-06-08 LAB — CBC WITH DIFFERENTIAL/PLATELET
Absolute Monocytes: 760 cells/uL (ref 200–950)
Basophils Absolute: 61 cells/uL (ref 0–200)
Basophils Relative: 0.8 %
Eosinophils Absolute: 91 cells/uL (ref 15–500)
Eosinophils Relative: 1.2 %
HCT: 44.9 % (ref 38.5–50.0)
Hemoglobin: 15.7 g/dL (ref 13.2–17.1)
Lymphs Abs: 1740 cells/uL (ref 850–3900)
MCH: 35.3 pg — ABNORMAL HIGH (ref 27.0–33.0)
MCHC: 35 g/dL (ref 32.0–36.0)
MCV: 100.9 fL — ABNORMAL HIGH (ref 80.0–100.0)
MPV: 10.2 fL (ref 7.5–12.5)
Monocytes Relative: 10 %
Neutro Abs: 4948 cells/uL (ref 1500–7800)
Neutrophils Relative %: 65.1 %
Platelets: 217 10*3/uL (ref 140–400)
RBC: 4.45 10*6/uL (ref 4.20–5.80)
RDW: 11.8 % (ref 11.0–15.0)
Total Lymphocyte: 22.9 %
WBC: 7.6 10*3/uL (ref 3.8–10.8)

## 2021-06-08 LAB — TSH: TSH: 1.16 mIU/L (ref 0.40–4.50)

## 2021-06-08 LAB — MAGNESIUM: Magnesium: 1.7 mg/dL (ref 1.5–2.5)

## 2021-06-08 LAB — URIC ACID: Uric Acid, Serum: 6.2 mg/dL (ref 4.0–8.0)

## 2021-06-09 ENCOUNTER — Other Ambulatory Visit: Payer: Self-pay | Admitting: Internal Medicine

## 2021-06-27 DIAGNOSIS — N1831 Chronic kidney disease, stage 3a: Secondary | ICD-10-CM | POA: Diagnosis not present

## 2021-06-27 DIAGNOSIS — E673 Hypervitaminosis D: Secondary | ICD-10-CM | POA: Diagnosis not present

## 2021-06-27 DIAGNOSIS — I129 Hypertensive chronic kidney disease with stage 1 through stage 4 chronic kidney disease, or unspecified chronic kidney disease: Secondary | ICD-10-CM | POA: Diagnosis not present

## 2021-06-29 ENCOUNTER — Other Ambulatory Visit: Payer: Self-pay | Admitting: Adult Health

## 2021-06-29 DIAGNOSIS — I10 Essential (primary) hypertension: Secondary | ICD-10-CM

## 2021-07-03 DIAGNOSIS — M2022 Hallux rigidus, left foot: Secondary | ICD-10-CM | POA: Diagnosis not present

## 2021-08-15 DIAGNOSIS — M2022 Hallux rigidus, left foot: Secondary | ICD-10-CM | POA: Diagnosis not present

## 2021-08-15 DIAGNOSIS — Z4889 Encounter for other specified surgical aftercare: Secondary | ICD-10-CM | POA: Diagnosis not present

## 2021-08-19 ENCOUNTER — Other Ambulatory Visit: Payer: Self-pay | Admitting: Internal Medicine

## 2021-08-19 MED ORDER — TRAZODONE HCL 150 MG PO TABS: ORAL_TABLET | ORAL | 0 refills | Status: DC

## 2021-08-29 ENCOUNTER — Other Ambulatory Visit: Payer: Self-pay | Admitting: Internal Medicine

## 2021-08-29 MED ORDER — GABAPENTIN 600 MG PO TABS: ORAL_TABLET | ORAL | 0 refills | Status: DC

## 2021-09-05 ENCOUNTER — Ambulatory Visit (INDEPENDENT_AMBULATORY_CARE_PROVIDER_SITE_OTHER): Payer: PPO | Admitting: Internal Medicine

## 2021-09-05 ENCOUNTER — Encounter: Payer: Self-pay | Admitting: Internal Medicine

## 2021-09-05 ENCOUNTER — Other Ambulatory Visit: Payer: Self-pay

## 2021-09-05 VITALS — BP 118/72 | HR 60 | Temp 97.1°F | Resp 16 | Ht 67.0 in | Wt 149.4 lb

## 2021-09-05 DIAGNOSIS — G4700 Insomnia, unspecified: Secondary | ICD-10-CM

## 2021-09-05 DIAGNOSIS — G8929 Other chronic pain: Secondary | ICD-10-CM | POA: Diagnosis not present

## 2021-09-05 DIAGNOSIS — I10 Essential (primary) hypertension: Secondary | ICD-10-CM | POA: Diagnosis not present

## 2021-09-05 DIAGNOSIS — M5440 Lumbago with sciatica, unspecified side: Secondary | ICD-10-CM

## 2021-09-05 DIAGNOSIS — M549 Dorsalgia, unspecified: Secondary | ICD-10-CM

## 2021-09-05 MED ORDER — LORAZEPAM 1 MG PO TABS: ORAL_TABLET | ORAL | 0 refills | Status: DC

## 2021-09-05 MED ORDER — DEXAMETHASONE 4 MG PO TABS: ORAL_TABLET | ORAL | 1 refills | Status: DC

## 2021-09-05 NOTE — Progress Notes (Signed)
Future Appointments  Date Time Provider Department Center  09/05/2021  2:00 PM Lucky Cowboy, MD [facility] None  12/11/2021  3:00 PM Lucky Cowboy, MD [facility] None  03/04/2022  3:30 PM Judd Gaudier, NP [facility] None    History of Present Illness:            [[  Copy :  Patient has chronic L sided LBP consequent from falling off of a ladder in 2016.  He has tried epidural injections, dry needling, PT, Lyrica/ Gabapentin  in the past which have been ineffective.  He  requests trigger point injection for his chronic left sided low back pain which have been beneficial in the past. ]]     Patient continues to have pain in Left lower back/buttock area and desires another trigger point injection.      Medications    atenolol (TENORMIN) 50 MG tablet, TAKE 1 TABLET DAILY    atorvastatin (LIPITOR) 40 MG tablet, Take 1 tablet Daily   losartan (COZAAR) 50 MG tablet, Take 50 mgdaily.   acetaminophen (TYLENOL) 500 MG tablet, Take every 6 (six) hours as needed.    VITAMIN D , Take 2,000 Unitsdaily.   esomeprazole (NEXIUM) 40 MG capsule, TAKE ONE CAPSULE DAILY    finasteride (PROSCAR) 5 MG tablet, Take 1 tablet daily.   gabapentin (NEURONTIN) 600 MG tablet, Take  1/2 tablet  at Suppertime &  1/2 tablet  at Bedtime as needed for Sleep   LORazepam 1 MG tablet, Take 1/2 - 1 tablet at Bedtime ONLY if needed   Problem list He has Hypertension; Hyperlipidemia; GERD (gastroesophageal reflux disease); Vitamin D deficiency; Medication management; Insomnia; DDD (degenerative disc disease), lumbosacral; CKD (chronic kidney disease) stage 3, GFR 30-59 ml/min (HCC); ADD (attention deficit disorder); and Non-seasonal allergic rhinitis on their problem list.   Observations/Objective:  BP 118/72   Pulse 60   Temp (!) 97.1 F (36.2 C)   Resp 16   Ht 5\' 7"  (1.702 m)   Wt 149 lb 6.4 oz (67.8 kg)   SpO2 99%   BMI 23.40 kg/m   HEENT - WNL. Neck - supple.  Chest - Clear equal BS. Cor - Nl HS.  RRR w/o sig MGR. PP 1(+). No edema. MS- FROM w/o deformities.  Gait Nl. Neuro -  Nl w/o focal abnormalities.   Procedure (CPT [phone]) Trigger point Injection                             After informed consent and aseptic prep with alcohol, the trigger point was injected with a mixture of 1 ml of Marcaine 0.5% and 1 ml (10 mg) Dexamethasone (100 mg /10 ml vial) with relief of the tenderness /pain.    Assessment and Plan:  1. Essential hypertension   2. Trigger point with back pain  - dexamethasone  4 MG tablet; Take  1 tablet  3 x day  or as directed  Dispense: 30 tablet; Refill: 1  3. Chronic low back pain with sciatica  - dexamethasone  4 MG tablet; Take  1 tablet  3 x day  or as directed  Dispense: 30 tablet; Refill: 1  4. Insomnia,  - LORazepam (ATIVAN) 1 MG tablet; Take   1/2 - 1 tablet    at Bedtime   ONLY   if needed for Sleep &  limit to 5 days /week to avoid Addiction & Dementia  Dispense: 90 tablet; Refill: 0  Follow Up Instructions:       I discussed the assessment and treatment plan with the patient. The patient was provided an opportunity to ask questions and all were answered. The patient agreed with the plan and demonstrated an understanding of the instructions.       The patient was advised to call back or seek an in-person evaluation if the symptoms worsen or if the condition fails to improve as anticipated.    Marinus Maw, MD

## 2021-09-05 NOTE — Patient Instructions (Signed)
Trigger Point Injection ?Trigger points are areas where you have pain. A trigger point injection is a shot given in the trigger point to help relieve pain for a few days to a few months. Common places for trigger points include the neck, shoulders, upper back, or lower back. ?A trigger point injection will not cure long-term (chronic) pain permanently. These injections do not always work for every person. For some people, they can help to relieve pain for a few days to a few months. ?Tell a health care provider about: ?Any allergies you have. ?All medicines you are taking, including vitamins, herbs, eye drops, creams, and over-the-counter medicines. ?Any problems you or family members have had with anesthetic medicines. ?Any bleeding problems you have. ?Any surgeries you have had. ?Any medical conditions you have. ?Whether you are pregnant or may be pregnant. ?What are the risks? ?Generally, this is a safe procedure. However, problems may occur, including: ?Infection. ?Bleeding or bruising. ?Allergic reaction to the injected medicine. ?Irritation of the skin around the injection site. ?What happens before the procedure? ?Ask your health care provider about: ?Changing or stopping your regular medicines. This is especially important if you are taking diabetes medicines or blood thinners. ?Taking medicines such as aspirin and ibuprofen. These medicines can thin your blood. Do not take these medicines unless your health care provider tells you to take them. ?Taking over-the-counter medicines, vitamins, herbs, and supplements. ?What happens during the procedure? ? ?Your health care provider will feel for trigger points. A marker may be used to circle the area for the injection. ?The skin over the trigger point will be washed with a germ-killing soap. ?You may be given a medicine to help you relax (sedative). ?A thin needle is used for the injection. You may feel pain or a twitching feeling when the needle enters your  skin. ?A numbing solution may be injected into the trigger point. Sometimes a medicine to keep down inflammation is also injected. ?Your health care provider may move the needle around the area where the trigger point is located until the tightness and twitching goes away. ?After the injection, your health care provider may put gentle pressure over the injection site. ?The injection site will be covered with a bandage (dressing). ?The procedure may vary among health care providers and hospitals. ?What can I expect after treatment? ?After treatment, you may have soreness and stiffness for 1-2 days. ?Follow these instructions at home: ?Injection site care ?Remove your dressing in a few hours, or as told by your health care provider. ?Check your injection site every day for signs of infection. Check for: ?Redness, swelling, or pain. ?Fluid or blood. ?Warmth. ?Pus or a bad smell. ?Managing pain, stiffness, and swelling ?If directed, put ice on the affected area. To do this: ?Put ice in a plastic bag. ?Place a towel between your skin and the bag. ?Leave the ice on for 20 minutes, 2-3 times a day. ?Remove the ice if your skin turns bright red. This is very important. If you cannot feel pain, heat, or cold, you have a greater risk of damage to the area. ?Activity ?If you were given a sedative during the procedure, it can affect you for several hours. Do not drive or operate machinery until your health care provider says that it is safe. ?Do not take baths, swim, or use a hot tub until your health care provider approves. ?Return to your normal activities as told by your health care provider. Ask your health care provider what   activities are safe for you. ?General instructions ?If you were asked to stop your regular medicines, ask your health care provider when you may start taking them again. ?You may be asked to see an occupational or physical therapist for exercises that reduce muscle strain and stretch the area of the  trigger point. ?Keep all follow-up visits. This is important. ?Contact a health care provider if: ?Your pain comes back, and it is worse than before the injection. You may need more injections. ?You have chills or a fever. ?The injection site becomes more painful, red, swollen, or warm to the touch. ?Summary ?A trigger point injection is a shot given in the trigger point to help relieve pain. ?Common places for trigger point injections are the neck, shoulders, upper back, and lower back. ?These injections do not always work for every person, but for some people, the injections can help to relieve pain for a few days to a few months. ?Contact a health care provider if symptoms come back or if they are worse than before treatment. Also, get help if the injection site becomes more painful, red, swollen, or warm to the touch. ?This information is not intended to replace advice given to you by your health care provider. Make sure you discuss any questions you have with your health care provider. ?Document Revised: 03/06/2021 Document Reviewed: 03/06/2021 ?Elsevier Patient Education ? 2022 [employer] ? ?

## 2021-10-29 ENCOUNTER — Telehealth: Payer: Self-pay | Admitting: Internal Medicine

## 2021-10-29 NOTE — Progress Notes (Signed)
  Chronic Care Management   Note  10/29/2021 Name: [name] [name] MRN: [id number] DOB: 29-Jun-1951  [name] [name] is a 70 y.o. year old male who is a primary care patient of Lucky Cowboy, MD. I reached out to [name] [name] by phone today in response to a referral sent by [name]'s PCP, Lucky Cowboy, MD.   [name] was given information about Chronic Care Management services today including:  CCM service includes personalized support from designated clinical staff supervised by his physician, including individualized plan of care and coordination with other care providers 24/7 contact phone numbers for assistance for urgent and routine care needs. Service will only be billed when office clinical staff spend 20 minutes or more in a month to coordinate care. Only one practitioner may furnish and bill the service in a calendar month. The patient may stop CCM services at any time (effective at the end of the month) by phone call to the office staff.   Patient agreed to services and verbal consent obtained.   Follow up plan:   [employer], [employer]

## 2021-10-31 DIAGNOSIS — M2022 Hallux rigidus, left foot: Secondary | ICD-10-CM | POA: Diagnosis not present

## 2021-11-15 DIAGNOSIS — M2022 Hallux rigidus, left foot: Secondary | ICD-10-CM | POA: Diagnosis not present

## 2021-11-19 DIAGNOSIS — M5416 Radiculopathy, lumbar region: Secondary | ICD-10-CM | POA: Diagnosis not present

## 2021-11-19 DIAGNOSIS — S32009S Unspecified fracture of unspecified lumbar vertebra, sequela: Secondary | ICD-10-CM | POA: Diagnosis not present

## 2021-11-19 DIAGNOSIS — Z79891 Long term (current) use of opiate analgesic: Secondary | ICD-10-CM | POA: Diagnosis not present

## 2021-11-19 DIAGNOSIS — M4125 Other idiopathic scoliosis, thoracolumbar region: Secondary | ICD-10-CM | POA: Diagnosis not present

## 2021-11-19 DIAGNOSIS — M47817 Spondylosis without myelopathy or radiculopathy, lumbosacral region: Secondary | ICD-10-CM | POA: Diagnosis not present

## 2021-11-19 DIAGNOSIS — G894 Chronic pain syndrome: Secondary | ICD-10-CM | POA: Diagnosis not present

## 2021-11-21 ENCOUNTER — Other Ambulatory Visit: Payer: Self-pay | Admitting: Anesthesiology

## 2021-11-21 DIAGNOSIS — M5416 Radiculopathy, lumbar region: Secondary | ICD-10-CM

## 2021-11-22 ENCOUNTER — Encounter: Payer: Self-pay | Admitting: Internal Medicine

## 2021-12-10 ENCOUNTER — Encounter: Payer: Self-pay | Admitting: Internal Medicine

## 2021-12-10 NOTE — Progress Notes (Signed)
Annual  Screening/Preventative Visit  & Comprehensive Evaluation & Examination  Future Appointments  Date Time Provider Department  12/11/2021  3:00 PM Unk Pinto, MD [facility]  12/12/2021  1:30 PM Newton Pigg, [facility]  03/04/2022  3:30 PM Liane Comber, NP [facility]  12/16/2022  3:00 PM Unk Pinto, MD [facility]            This very nice 71 y.o. MWM presents for a Screening /Preventative Visit & comprehensive evaluation and management of multiple medical co-morbidities.  Patient has been followed for HTN, HLD, Prediabetes and Vitamin D Deficiency  who also has hx/o back surgeries and chronic pain syndrome.        Patient relates onset of his back pain to fall from a ladder in 2016.  He had several subsequent neg MRI exams and also failed trials with EDSI, dry needling, Physical therapy and Lyrica /Gabapentin In Dec 2020, patient had a laminectomy by Dr Ronnald Ramp.             HTN predates since 85 Patient's BP has been controlled at home.  Patient has CKD3a (GFR 57) and is followed bu Nephrology ( Dr Carolin Sicks) .Today's BP is at goal - 122/76. Patient denies any cardiac symptoms as chest pain, palpitations, shortness of breath, dizziness or ankle swelling.       Patient's hyperlipidemia is controlled with diet and medications. Patient denies myalgias or other medication SE's. Last lipids were  at goal except sl elevated Trig's :  Lab Results  Component Value Date   CHOL 156 06/07/2021   HDL 51 06/07/2021   LDLCALC 76 06/07/2021   TRIG 191 (H) 06/07/2021   CHOLHDL 3.1 06/07/2021         Patient has hx/o  prediabetes  & Insulin Resistance (Insulin 28.6 /2016 & 33.7 / 2018) and patient denies reactive hypoglycemic symptoms, visual blurring, diabetic polys or paresthesias. Last A1c was at goal :   Lab Results  Component Value Date   HGBA1C 4.9 11/23/2020          Finally, patient has history of Vitamin D Deficiency  ( 28" /2008) and last vitamin D was at  goal:   Lab Results  Component Value Date   VD25OH 67 06/07/2021     Current Outpatient Medications on File Prior to Visit  Medication Sig   acetaminophen (TYLENOL) 500 MG tablet Take every 6 hours as needed.    atenolol (TENORMIN) 50 MG tablet TAKE 1 TABLET  DAILY FOR BLOOD PRESSURE   atorvastatin (LIPITOR) 40 MG tablet Take 1 tablet Daily for Cholesterol   Cholecalciferol VITAMIN D Take 2,000 Units daily.   dexamethasone) 4 MG tablet Take  1 tablet  3 x day  or as directed   esomeprazole (NEXIUM) 40 MG capsule TAKE ONE CAPSULE DAILY    finasteride (PROSCAR) 5 MG tablet Take 1 tablet  daily.   LORazepam (ATIVAN) 1 MG tablet Take   1/2 - 1 tablet    at Bedtime   ONLY   if needed for Sleep &  limit to 5 days /week to avoid Addiction & Dementia   losartan 50 MG tablet Take h daily.      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   Dry eyes    Takes Restasis   GERD (  gastroesophageal reflux disease)    Hyperlipidemia    Hypertension    Vitamin D deficiency      Health Maintenance  Topic Date Due   Zoster Vaccines- Shingrix (1 of 2) Never done   Pneumonia Vaccine 58+ Years old (48 - PPSV23 if available, else PCV20) 10/21/2020   COVID-19 Vaccine (2 - Pfizer risk series) 11/15/2020   INFLUENZA VACCINE  07/09/2021   COLONOSCOPY (Pts 45-55yrs Insurance coverage will need to be confirmed)  07/15/2023   TETANUS/TDAP  07/21/2029   Hepatitis C Screening  Completed   HPV VACCINES  Aged Out     Immunization History  Administered Date(s) Administered   Influenza Split 09/22/2013   Influenza, High Dose  10/22/2019   Influenza 09/08/2017, 08/19/2018, 09/24/2020   PFIZER SARS-COV-2 Vacc 10/25/2020   PPD Test 09/13/2014, 07/25/2015, 08/08/2016, 09/08/2017, 10/09/2018   Pneumococcal -13 01/22/2018, 10/22/2019   Pneumococcal -23  01/19/2009   Td 07/22/2019   Tdap 01/19/2009   Zoster, Live 01/29/2013    Last Colon - 07/14/2018 - Dr Oletta Lamas - Negative - Recc 10 yr f/u due Aug 2029    Past Surgical History:  Procedure Laterality Date   BACK SURGERY  11/19/2019   L3-4 R foraminectomy, Dr. Ronnald Ramp   COLONOSCOPY W/ POLYPECTOMY     ESOPHAGOGASTRODUODENOSCOPY  1990   TOTAL HIP ARTHROPLASTY Left 10/28/2014   Procedure: [place];  Surgeon: Marybelle Killings, MD;  Location: [place];  Service: Orthopedics;  Laterality: Left;     Family History  Problem Relation Age of Onset   Osteoporosis Mother    Schizophrenia Mother    Heart disease Father    COPD Father    Diabetes Brother      Social History   Tobacco Use   Smoking status: Never   Smokeless tobacco: Never  Substance Use Topics   Alcohol use: Yes    Comment: social   Drug use: No      ROS Constitutional: Denies fever, chills, weight loss/gain, headaches, insomnia,  night sweats or change in appetite. Does c/o fatigue. Eyes: Denies redness, blurred vision, diplopia, discharge, itchy or watery eyes.  ENT: Denies discharge, congestion, post nasal drip, epistaxis, sore throat, earache, hearing loss, dental pain, Tinnitus, Vertigo, Sinus pain or snoring.  Cardio: Denies chest pain, palpitations, irregular heartbeat, syncope, dyspnea, diaphoresis, orthopnea, PND, claudication or edema Respiratory: denies cough, dyspnea, DOE, pleurisy, hoarseness, laryngitis or wheezing.  Gastrointestinal: Denies dysphagia, heartburn, reflux, water brash, pain, cramps, nausea, vomiting, bloating, diarrhea, constipation, hematemesis, melena, hematochezia, jaundice or hemorrhoids Genitourinary: Denies dysuria, frequency, urgency, nocturia, hesitancy, discharge, hematuria or flank pain Musculoskeletal: Denies arthralgia, myalgia, stiffness, Jt. Swelling, pain, limp or strain/sprain. Denies Falls. Skin: Denies puritis, rash, hives, warts, acne, eczema or  change in skin lesion Neuro: No weakness, tremor, incoordination, spasms, paresthesia or pain Psychiatric: Denies confusion, memory loss or sensory loss. Denies Depression. Endocrine: Denies change in weight, skin, hair change, nocturia, and paresthesia, diabetic polys, visual blurring or hyper / hypo glycemic episodes.  Heme/Lymph: No excessive bleeding, bruising or enlarged lymph nodes.   Physical Exam  BP 122/76    Pulse 67    Temp 97.9 F (36.6 C)    Resp 16    Ht 5\' 7"  (1.702 m)    Wt 149 lb (67.6 kg)    SpO2 97%    BMI 23.34 kg/m   General Appearance: Well nourished and well groomed and in no apparent distress.  Eyes: PERRLA, EOMs, conjunctiva no swelling or  erythema, normal fundi and vessels. Sinuses: No frontal/maxillary tenderness ENT/Mouth: EACs patent / TMs  nl. Nares clear without erythema, swelling, mucoid exudates. Oral hygiene is good. No erythema, swelling, or exudate. Tongue normal, non-obstructing. Tonsils not swollen or erythematous. Hearing normal.  Neck: Supple, thyroid not palpable. No bruits, nodes or JVD. Respiratory: Respiratory effort normal.  BS equal and clear bilateral without rales, rhonci, wheezing or stridor. Cardio: Heart sounds are normal with regular rate and rhythm and no murmurs, rubs or gallops. Peripheral pulses are normal and equal bilaterally without edema. No aortic or femoral bruits. Chest: symmetric with normal excursions and percussion.  Abdomen: Soft, with Nl bowel sounds. Nontender, no guarding, rebound, hernias, masses, or organomegaly.  Lymphatics: Non tender without lymphadenopathy.  Musculoskeletal: Full ROM all peripheral extremities, joint stability, 5/5 strength, and normal gait. Skin: Warm and dry without rashes, lesions, cyanosis, clubbing or  ecchymosis.  Neuro: Cranial nerves intact, reflexes equal bilaterally. Normal muscle tone, no cerebellar symptoms. Sensation intact.  Pysch: Alert and oriented X 3 with normal affect, insight  and judgment appropriate.   Assessment and Plan   2. Essential hypertension  - EKG 12-Lead - [place], RETROPERITNL ABD,  LTD - CBC with Differential/Platelet - COMPLETE METABOLIC PANEL WITH GFR - Magnesium - TSH  3. Hyperlipidemia, mixed  - EKG 12-Lead - [place], RETROPERITNL ABD,  LTD - Lipid panel - TSH  4. Abnormal glucose  - EKG 12-Lead - [place], RETROPERITNL ABD,  LTD - Hemoglobin A1c - Insulin, random  5. Vitamin D deficiency  - VITAMIN D 25 Hydroxy  6. Gastroesophageal reflux disease  - CBC with Differential/Platelet  7. Stage 3b chronic kidney disease ([place])   8. BPH with obstruction/lower urinary tract symptoms  - PSA  9. Prostate cancer screening  - PSA  10. Screening for colorectal cancer  - POC Hemoccult Bld/Stl  11. Screening for ischemic heart disease  - EKG 12-Lead  12. FHx: heart disease  - EKG 12-Lead - [place], RETROPERITNL ABD,  LTD  13. Screening for AAA (aortic abdominal aneurysm)  - [place], RETROPERITNL ABD,  LTD  14. Medication management  - Urinalysis, Routine w reflex microscopic - Microalbumin / creatinine urine ratio - CBC with Differential/Platelet - COMPLETE METABOLIC PANEL WITH GFR - Magnesium - Lipid panel - TSH - Hemoglobin A1c - Insulin, random - VITAMIN D 25 Hydroxy          Patient was counseled in prudent diet, weight control to achieve/maintain BMI less than 25, BP monitoring, regular exercise and medications as discussed.  Discussed med effects and SE's. Routine screening labs and tests as requested with regular follow-up as recommended. Over 40 minutes of exam, counseling, chart review and high complex critical decision making was performed   Kirtland Bouchard, MD

## 2021-12-10 NOTE — Patient Instructions (Signed)

## 2021-12-10 NOTE — Progress Notes (Deleted)

## 2021-12-11 ENCOUNTER — Other Ambulatory Visit: Payer: Self-pay

## 2021-12-11 ENCOUNTER — Encounter: Payer: Self-pay | Admitting: Internal Medicine

## 2021-12-11 ENCOUNTER — Ambulatory Visit (INDEPENDENT_AMBULATORY_CARE_PROVIDER_SITE_OTHER): Payer: PPO | Admitting: Internal Medicine

## 2021-12-11 VITALS — BP 122/76 | HR 67 | Temp 97.9°F | Resp 16 | Ht 67.0 in | Wt 149.0 lb

## 2021-12-11 DIAGNOSIS — Z0001 Encounter for general adult medical examination with abnormal findings: Secondary | ICD-10-CM

## 2021-12-11 DIAGNOSIS — Z13220 Encounter for screening for lipoid disorders: Secondary | ICD-10-CM | POA: Diagnosis not present

## 2021-12-11 DIAGNOSIS — N138 Other obstructive and reflux uropathy: Secondary | ICD-10-CM | POA: Diagnosis not present

## 2021-12-11 DIAGNOSIS — Z1211 Encounter for screening for malignant neoplasm of colon: Secondary | ICD-10-CM

## 2021-12-11 DIAGNOSIS — Z0000 Encounter for general adult medical examination without abnormal findings: Secondary | ICD-10-CM

## 2021-12-11 DIAGNOSIS — Z125 Encounter for screening for malignant neoplasm of prostate: Secondary | ICD-10-CM | POA: Diagnosis not present

## 2021-12-11 DIAGNOSIS — Z136 Encounter for screening for cardiovascular disorders: Secondary | ICD-10-CM

## 2021-12-11 DIAGNOSIS — E559 Vitamin D deficiency, unspecified: Secondary | ICD-10-CM

## 2021-12-11 DIAGNOSIS — E782 Mixed hyperlipidemia: Secondary | ICD-10-CM | POA: Diagnosis not present

## 2021-12-11 DIAGNOSIS — K219 Gastro-esophageal reflux disease without esophagitis: Secondary | ICD-10-CM

## 2021-12-11 DIAGNOSIS — N1832 Chronic kidney disease, stage 3b: Secondary | ICD-10-CM

## 2021-12-11 DIAGNOSIS — R7309 Other abnormal glucose: Secondary | ICD-10-CM

## 2021-12-11 DIAGNOSIS — I10 Essential (primary) hypertension: Secondary | ICD-10-CM

## 2021-12-11 DIAGNOSIS — N401 Enlarged prostate with lower urinary tract symptoms: Secondary | ICD-10-CM | POA: Diagnosis not present

## 2021-12-11 DIAGNOSIS — Z8249 Family history of ischemic heart disease and other diseases of the circulatory system: Secondary | ICD-10-CM

## 2021-12-11 DIAGNOSIS — Z79899 Other long term (current) drug therapy: Secondary | ICD-10-CM

## 2021-12-12 ENCOUNTER — Telehealth: Payer: PPO | Admitting: Pharmacist

## 2021-12-12 LAB — MAGNESIUM: Magnesium: 2.3 mg/dL (ref 1.5–2.5)

## 2021-12-12 LAB — CBC WITH DIFFERENTIAL/PLATELET
Absolute Monocytes: 960 cells/uL — ABNORMAL HIGH (ref 200–950)
Basophils Absolute: 20 cells/uL (ref 0–200)
Basophils Relative: 0.2 %
Eosinophils Absolute: 10 cells/uL — ABNORMAL LOW (ref 15–500)
Eosinophils Relative: 0.1 %
HCT: 45.2 % (ref 38.5–50.0)
Hemoglobin: 15.8 g/dL (ref 13.2–17.1)
Lymphs Abs: 1131 cells/uL (ref 850–3900)
MCH: 34.7 pg — ABNORMAL HIGH (ref 27.0–33.0)
MCHC: 35 g/dL (ref 32.0–36.0)
MCV: 99.3 fL (ref 80.0–100.0)
MPV: 10.1 fL (ref 7.5–12.5)
Monocytes Relative: 9.5 %
Neutro Abs: 7979 cells/uL — ABNORMAL HIGH (ref 1500–7800)
Neutrophils Relative %: 79 %
Platelets: 218 10*3/uL (ref 140–400)
RBC: 4.55 10*6/uL (ref 4.20–5.80)
RDW: 12.4 % (ref 11.0–15.0)
Total Lymphocyte: 11.2 %
WBC: 10.1 10*3/uL (ref 3.8–10.8)

## 2021-12-12 LAB — LIPID PANEL
Cholesterol: 164 mg/dL (ref <200)
HDL: 86 mg/dL (ref >=40)
LDL Cholesterol (Calc): 60 mg/dL (calc)
Non-HDL Cholesterol (Calc): 78 mg/dL (calc) (ref <130)
Total CHOL/HDL Ratio: 1.9 (calc) (ref <5.0)
Triglycerides: 98 mg/dL (ref <150)

## 2021-12-12 LAB — COMPLETE METABOLIC PANEL WITH GFR
AG Ratio: 1.9 (calc) (ref 1.0–2.5)
ALT: 18 U/L (ref 9–46)
AST: 19 U/L (ref 10–35)
Albumin: 4.2 g/dL (ref 3.6–5.1)
Alkaline phosphatase (APISO): 57 U/L (ref 35–144)
BUN/Creatinine Ratio: 19 (calc) (ref 6–22)
BUN: 28 mg/dL — ABNORMAL HIGH (ref 7–25)
CO2: 29 mmol/L (ref 20–32)
Calcium: 9.5 mg/dL (ref 8.6–10.3)
Chloride: 100 mmol/L (ref 98–110)
Creat: 1.51 mg/dL — ABNORMAL HIGH (ref 0.70–1.35)
Globulin: 2.2 g/dL (calc) (ref 1.9–3.7)
Glucose, Bld: 83 mg/dL (ref 65–99)
Potassium: 4.2 mmol/L (ref 3.5–5.3)
Sodium: 138 mmol/L (ref 135–146)
Total Bilirubin: 1 mg/dL (ref 0.2–1.2)
Total Protein: 6.4 g/dL (ref 6.1–8.1)
eGFR: 50 mL/min/{1.73_m2} — ABNORMAL LOW (ref >=60)

## 2021-12-12 LAB — URINALYSIS, ROUTINE W REFLEX MICROSCOPIC
Bilirubin Urine: NEGATIVE
Glucose, UA: NEGATIVE
Hgb urine dipstick: NEGATIVE
Leukocytes,Ua: NEGATIVE
Nitrite: NEGATIVE
Protein, ur: NEGATIVE
Specific Gravity, Urine: 1.027 (ref 1.001–1.035)
pH: 5.5 (ref 5.0–8.0)

## 2021-12-12 LAB — MICROALBUMIN / CREATININE URINE RATIO
Creatinine, Urine: 227 mg/dL (ref 20–320)
Microalb, Ur: <0.2 mg/dL

## 2021-12-12 LAB — HEMOGLOBIN A1C
Hgb A1c MFr Bld: 5.1 % of total Hgb (ref <5.7)
Mean Plasma Glucose: 100 mg/dL
eAG (mmol/L): 5.5 mmol/L

## 2021-12-12 LAB — VITAMIN D 25 HYDROXY (VIT D DEFICIENCY, FRACTURES): Vit D, 25-Hydroxy: 56 ng/mL (ref 30–100)

## 2021-12-12 LAB — INSULIN, RANDOM: Insulin: 17.7 u[IU]/mL

## 2021-12-12 LAB — TSH: TSH: 0.84 mIU/L (ref 0.40–4.50)

## 2021-12-12 LAB — PSA: PSA: 0.43 ng/mL (ref <=4.00)

## 2021-12-12 NOTE — Progress Notes (Signed)
============================================================ °-   Test results slightly outside the reference range are not unusual. If there is anything important, I will review this with you,  otherwise it is considered normal test values.  If you have further questions,  please do not hesitate to contact me at the office or via My Chart.  ============================================================ ============================================================  -  PSA - very Low - Great ! ============================================================ ============================================================  -  [id number] = Chronic Kidney Disease - Stage 3 a - is Stable & OK ============================================================ ============================================================  -  Total Chol = 164  and   LDL Chol = 60 - Both  Excellent   - Very low risk for Heart Attack  / Stroke ============================================================ ============================================================  -   A1c - Normal - Great  - No  Diabetes ============================================================ ============================================================  -  Vitamin D = 56 - Great - Please keep dose same ============================================================ ============================================================  -  All Else - CBC - Kidneys - Electrolytes - Liver - Magnesium & Thyroid    - all  Normal / OK ============================================================ ============================================================

## 2021-12-17 ENCOUNTER — Other Ambulatory Visit: Payer: PPO

## 2021-12-17 ENCOUNTER — Ambulatory Visit
Admission: RE | Admit: 2021-12-17 | Discharge: 2021-12-17 | Disposition: A | Discharge status: Home / Self Care | Payer: PPO | Source: Ambulatory Visit | Attending: Anesthesiology | Admitting: Anesthesiology

## 2021-12-17 ENCOUNTER — Other Ambulatory Visit: Payer: Self-pay

## 2021-12-17 DIAGNOSIS — M5416 Radiculopathy, lumbar region: Secondary | ICD-10-CM

## 2021-12-17 DIAGNOSIS — M48061 Spinal stenosis, lumbar region without neurogenic claudication: Secondary | ICD-10-CM | POA: Diagnosis not present

## 2021-12-17 DIAGNOSIS — M5126 Other intervertebral disc displacement, lumbar region: Secondary | ICD-10-CM | POA: Diagnosis not present

## 2021-12-17 MED ORDER — GADOBENATE DIMEGLUMINE 529 MG/ML IV SOLN
14.0000 mL | Freq: Once | INTRAVENOUS | Status: AC | PRN
  Administered 2021-12-17: 14 mL via INTRAVENOUS

## 2021-12-18 DIAGNOSIS — M5416 Radiculopathy, lumbar region: Secondary | ICD-10-CM | POA: Diagnosis not present

## 2021-12-27 ENCOUNTER — Encounter: Payer: Self-pay | Admitting: Internal Medicine

## 2021-12-27 ENCOUNTER — Other Ambulatory Visit: Payer: Self-pay | Admitting: Internal Medicine

## 2021-12-27 MED ORDER — PSEUDOEPHEDRINE HCL ER 120 MG PO TB12: ORAL_TABLET | ORAL | 1 refills | Status: DC

## 2021-12-27 MED ORDER — DEXAMETHASONE 4 MG PO TABS: ORAL_TABLET | ORAL | 0 refills | Status: DC

## 2022-01-07 ENCOUNTER — Telehealth: Payer: Self-pay

## 2022-01-07 NOTE — Telephone Encounter (Signed)
Called pt to schedule visit w/ CPP. LVMTRC.  Total time spent: 2 minutes [place] Bruns, [facility]

## 2022-01-15 DIAGNOSIS — M5417 Radiculopathy, lumbosacral region: Secondary | ICD-10-CM | POA: Diagnosis not present

## 2022-01-21 DIAGNOSIS — M47817 Spondylosis without myelopathy or radiculopathy, lumbosacral region: Secondary | ICD-10-CM | POA: Diagnosis not present

## 2022-01-21 DIAGNOSIS — S32009S Unspecified fracture of unspecified lumbar vertebra, sequela: Secondary | ICD-10-CM | POA: Diagnosis not present

## 2022-01-21 DIAGNOSIS — M5416 Radiculopathy, lumbar region: Secondary | ICD-10-CM | POA: Diagnosis not present

## 2022-01-21 DIAGNOSIS — M4125 Other idiopathic scoliosis, thoracolumbar region: Secondary | ICD-10-CM | POA: Diagnosis not present

## 2022-02-05 DIAGNOSIS — E785 Hyperlipidemia, unspecified: Secondary | ICD-10-CM | POA: Diagnosis not present

## 2022-02-05 DIAGNOSIS — I129 Hypertensive chronic kidney disease with stage 1 through stage 4 chronic kidney disease, or unspecified chronic kidney disease: Secondary | ICD-10-CM | POA: Diagnosis not present

## 2022-02-05 DIAGNOSIS — N1831 Chronic kidney disease, stage 3a: Secondary | ICD-10-CM | POA: Diagnosis not present

## 2022-02-05 DIAGNOSIS — E673 Hypervitaminosis D: Secondary | ICD-10-CM | POA: Diagnosis not present

## 2022-02-25 ENCOUNTER — Encounter: Payer: Self-pay | Admitting: Internal Medicine

## 2022-02-26 ENCOUNTER — Other Ambulatory Visit: Payer: Self-pay | Admitting: Adult Health

## 2022-02-26 DIAGNOSIS — E782 Mixed hyperlipidemia: Secondary | ICD-10-CM

## 2022-03-04 ENCOUNTER — Other Ambulatory Visit: Payer: Self-pay | Admitting: Nurse Practitioner

## 2022-03-04 ENCOUNTER — Telehealth: Payer: Self-pay | Admitting: Nurse Practitioner

## 2022-03-04 ENCOUNTER — Ambulatory Visit: Payer: PPO | Admitting: Adult Health

## 2022-03-04 DIAGNOSIS — B001 Herpesviral vesicular dermatitis: Secondary | ICD-10-CM

## 2022-03-04 DIAGNOSIS — I10 Essential (primary) hypertension: Secondary | ICD-10-CM

## 2022-03-04 MED ORDER — VALACYCLOVIR HCL 500 MG PO TABS: 500.0000 mg | ORAL_TABLET | Freq: Three times a day (TID) | ORAL | 0 refills | Status: DC

## 2022-03-04 MED ORDER — ATENOLOL 50 MG PO TABS: ORAL_TABLET | ORAL | 3 refills | Status: DC

## 2022-03-04 NOTE — Telephone Encounter (Signed)
.  Patient is requesting refills on Acyclovir (fever blisters). Pharmacy is telling [name] [name] that they cannot request a refill from [place] because the script is too old. He also needs a refill on Atenolol. Please send to [facility] ?

## 2022-03-04 NOTE — Telephone Encounter (Signed)
I sent the prescription for atenolol and valcyclovir which is what he has on record as previously used

## 2022-03-19 ENCOUNTER — Ambulatory Visit: Payer: PPO | Admitting: Nurse Practitioner

## 2022-04-10 ENCOUNTER — Encounter: Payer: Self-pay | Admitting: Nurse Practitioner

## 2022-04-10 ENCOUNTER — Ambulatory Visit (INDEPENDENT_AMBULATORY_CARE_PROVIDER_SITE_OTHER): Payer: PPO | Admitting: Nurse Practitioner

## 2022-04-10 VITALS — BP 110/72 | HR 72 | Temp 97.3°F | Wt 150.4 lb

## 2022-04-10 DIAGNOSIS — N1832 Chronic kidney disease, stage 3b: Secondary | ICD-10-CM

## 2022-04-10 DIAGNOSIS — E559 Vitamin D deficiency, unspecified: Secondary | ICD-10-CM

## 2022-04-10 DIAGNOSIS — Z0000 Encounter for general adult medical examination without abnormal findings: Secondary | ICD-10-CM

## 2022-04-10 DIAGNOSIS — F988 Other specified behavioral and emotional disorders with onset usually occurring in childhood and adolescence: Secondary | ICD-10-CM

## 2022-04-10 DIAGNOSIS — I10 Essential (primary) hypertension: Secondary | ICD-10-CM

## 2022-04-10 DIAGNOSIS — Z79899 Other long term (current) drug therapy: Secondary | ICD-10-CM | POA: Diagnosis not present

## 2022-04-10 DIAGNOSIS — E785 Hyperlipidemia, unspecified: Secondary | ICD-10-CM

## 2022-04-10 DIAGNOSIS — J3089 Other allergic rhinitis: Secondary | ICD-10-CM

## 2022-04-10 DIAGNOSIS — M5137 Other intervertebral disc degeneration, lumbosacral region: Secondary | ICD-10-CM

## 2022-04-10 DIAGNOSIS — G4700 Insomnia, unspecified: Secondary | ICD-10-CM

## 2022-04-10 DIAGNOSIS — K219 Gastro-esophageal reflux disease without esophagitis: Secondary | ICD-10-CM

## 2022-04-10 MED ORDER — LORAZEPAM 1 MG PO TABS: ORAL_TABLET | ORAL | 0 refills | Status: DC

## 2022-04-10 NOTE — Progress Notes (Signed)
WELLNESS VISIT  & 6 MONTH FOLLOW UP  1. Encounter for Medicare annual wellness exam Due Annually  2. Primary hypertension Discussed lifestyle modifications Continue medications,  DASH diet, exercise and monitor at home.  Call if greater than 130/80.   - CBC with Differential/Platelet  3. Non-seasonal allergic rhinitis, unspecified trigger Continue antihistamine Hygiene reviewed; avoid trigger  4. Gastroesophageal reflux disease, unspecified whether esophagitis present Continue PPI/H2 blocker Diet discussed Avoid triggers Avoid lying down after eating.   5. DDD (degenerative disc disease), lumbosacral Continue follow up neurosurgery/Dr. Phillips/ pain management as needed   6. Stage 3b chronic kidney disease [facility]) Nephrology following;  Discussed avoiding nephrotoxic foods, mediations. Stay well hydrated. Keep BP well control   - COMPLETE METABOLIC PANEL WITH GFR  7. Hyperlipidemia, unspecified hyperlipidemia type Lifestyle modifications Weight loss Remain active.    8. Vitamin D deficiency Continue supplement  9. Insomnia, unspecified type Discussed cannot continue to prescribe with opioid; risk of unintentional overdose reviewed; endorses rare use and plans to stop completely.  Will continue to review PDMP prior to any refills.   - LORazepam (ATIVAN) 1 MG tablet; Take   1/2 - 1 tablet    at Bedtime   ONLY   if needed for Sleep &  limit to 5 days /week to avoid Addiction & Dementia  Dispense: 90 tablet; Refill: 0  10. Attention deficit disorder, unspecified hyperactivity presence Continue ADD medication, helps with focus, no AE's. The patient was counseled on the addictive nature of the medication and was encouraged to take drug holidays when not needed.    11. Medication management All medications discussed All questions and concerns addressed.  - CBC with Differential/Platelet - COMPLETE METABOLIC PANEL WITH GFR   Continue diet and meds as discussed.  Further disposition pending results of labs. Discussed med's effects and SE's.   Over 30 minutes of exam, counseling, chart review, and critical decision making was performed.   Future Appointments  Date Time Provider [place]  08/20/2022  3:30 PM Darrol Jump, NP [facility] None  12/16/2022  3:00 PM Unk Pinto, MD [facility] None     Plan:   During the course of the visit the patient was educated and counseled about appropriate screening and preventive services including:   Pneumococcal vaccine  Prevnar 13 Influenza vaccine Td vaccine Screening electrocardiogram Bone densitometry screening Colorectal cancer screening Diabetes screening Glaucoma screening Nutrition counseling  Advanced directives: requested    ----------------------------------------------------------------------------------------------------------------------  HPI 71 y.o. male  presents for 3MOV and AWV. He has Hypertension; Hyperlipidemia; GERD (gastroesophageal reflux disease); Vitamin D deficiency; Medication management; Insomnia; DDD (degenerative disc disease), lumbosacral; CKD (chronic kidney disease) stage 3, GFR 30-59 ml/min (HCC); ADD (attention deficit disorder); and Non-seasonal allergic rhinitis on their problem list.  Retired, has a place at [employer] at [facility]  He is concerned about persisent daytime cough ongoing for several months, also persistent allergy sx, posts nasal drip; feels this irritates throat and triggers cough; denies night time sx; switched from clarifin to allegra with some improvement, flonase didn't seem to help. Hasn't tried singlaire or other nasal sprays.   Chronic back pain history; He had out patient back surgery by Dr. Ronnald Ramp at L3-4, L foraminectomy on 11/19/2019, has some mild residual sx, then in July had sudden worsening of pain. Now followed by pain management Dr. Hardin Negus. Recent lumbar MRI on 07/18/2020 showed acute/early subacute L2 inferior  endplate compression fracture with mild height loss. Redemonstrated severe chronic L1 vertebral burst fracture with  unchanged 6 mm bony retropulsion. Had L5 nerve root block, then on 10/19/2020 had hemilaminectomy in L4-5 with progressive improvement. He is prescribed oxycodone 5 mg by Dr. Hardin Negus, very infrequent, planning to stop taking.  Takes 2 extra strength tylenol daily.    Patient is on an ADD medication, he states that the medication is helping and he denies any adverse reactions. Takes adderall 10 mg 2-3 days/week for focus.   He has insomnia and as been prescribed ativan 0.5 mg ~5 days per week. Tried belsomra but with AM sedation.   He has a diagnosis of GERD which is currently managed by nexium 40 mg every other day.  He had failed attempts to Pepcid.  Symptoms are currently well controlled.   BMI is Body mass index is 23.56 kg/m., he has been working on diet, exercise currently limited as recovering from back surgery.  Wt Readings from Last 3 Encounters:  04/10/22 150 lb 6.4 oz (68.2 kg)  12/11/21 149 lb (67.6 kg)  09/05/21 149 lb 6.4 oz (67.8 kg)   His blood pressure has been controlled at home, today their BP is BP: 110/72  He does not workout. He denies chest pain, shortness of breath, dizziness.  He has CKD III followed by nephrology, [facility],  Lab Results  Component Value Date   GFRNONAA 49 (L) 06/07/2021   GFRNONAA 51 (L) 03/01/2021   GFRNONAA 48 (L) 11/23/2020    He is on cholesterol medication and denies myalgias. His LDL cholesterol is at goal; triglycerides remain elevated. The cholesterol last visit was:   Lab Results  Component Value Date   CHOL 164 12/11/2021   HDL 86 12/11/2021   LDLCALC 60 12/11/2021   TRIG 98 12/11/2021   CHOLHDL 1.9 12/11/2021    He has been working on diet and exercise for glucose management, and denies foot ulcerations, nausea, polydipsia, polyuria, visual disturbances, vomiting and weight loss. Last A1C in the office was:   Lab Results  Component Value Date   HGBA1C 5.1 12/11/2021   Patient is on Vitamin D supplement taking 2000 IU for kidney protection.  Lab Results  Component Value Date   VD25OH 56 12/11/2021      Current Medications:   Current Outpatient Medications (Endocrine & Metabolic):    dexamethasone (DECADRON) 4 MG tablet, Take 1 tab 3 x /day for 2 days,      then 2 x /day for 2  Days,     then 1 tab daily (Patient not taking: Reported on 04/10/2022)  Current Outpatient Medications (Cardiovascular):    atenolol (TENORMIN) 50 MG tablet, TAKE 1 TABLET BY MOUTH DAILY FOR BLOOD PRESSURE   atorvastatin (LIPITOR) 40 MG tablet, TAKE 1 TABLET BY MOUTH DAILY FOR CHOLESTEROL   losartan (COZAAR) 50 MG tablet, Take 50 mg by mouth daily.  Current Outpatient Medications (Respiratory):    cetirizine (ZYRTEC) 10 MG tablet, Take 10 mg by mouth daily.   pseudoephedrine (SUDAFED) 120 MG 12 hr tablet, Take  1 tablet  2 x /day (every 12 hours)  for Head and Chest Congestion (Patient not taking: Reported on 04/10/2022)  Current Outpatient Medications (Analgesics):    acetaminophen (TYLENOL) 500 MG tablet, Take 500 mg by mouth every 6 (six) hours as needed.    Current Outpatient Medications (Other):    Cholecalciferol (VITAMIN D PO), Take 2,000 Units by mouth daily.   esomeprazole (NEXIUM) 40 MG capsule, TAKE ONE CAPSULE DAILY AT NOON FOR REFLUX.   finasteride (PROSCAR) 5 MG tablet, Take  1 tablet (5 mg total) by mouth daily.   valACYclovir (VALTREX) 500 MG tablet, Take 1 tablet (500 mg total) by mouth 3 (three) times daily. TAKE 1 TABLET BY MOUTH THREE TIMES DAILY FOR FEVER BLISTERS AS Needed   LORazepam (ATIVAN) 1 MG tablet, Take   1/2 - 1 tablet    at Bedtime   ONLY   if needed for Sleep &  limit to 5 days /week to avoid Addiction & Dementia   Medical History:  Past Medical History:  Diagnosis Date   Arthritis    right thumb   Avascular necrosis of left femoral head (HCC) 10/28/2014   Dry eyes    Takes  Restasis   GERD (gastroesophageal reflux disease)    Hyperlipidemia    Hypertension    Vitamin D deficiency    Allergies Allergies  Allergen Reactions   Nsaids     Renal Insufficiency    SURGICAL HISTORY He  has a past surgical history that includes Esophagogastroduodenoscopy (1990); Colonoscopy w/ polypectomy; Total hip arthroplasty (Left, 10/28/2014); and Back surgery (11/19/2019). FAMILY HISTORY His family history includes COPD in his father; Diabetes in his brother; Heart disease in his father; Osteoporosis in his mother; Schizophrenia in his mother. SOCIAL HISTORY He  reports that he has never smoked. He has never used smokeless tobacco. He reports current alcohol use. He reports that he does not use drugs.  Immunization History  Administered Date(s) Administered   Influenza Split 09/22/2013   Influenza, High Dose Seasonal PF 10/22/2019   Influenza-Unspecified 09/08/2017, 08/19/2018, 09/24/2020   PFIZER(Purple Top)SARS-COV-2 Vaccination 10/25/2020   PPD Test 09/13/2014, 07/25/2015, 08/08/2016, 09/08/2017, 10/09/2018   Pneumococcal Conjugate-13 01/22/2018, 10/22/2019   Pneumococcal Polysaccharide-23 01/19/2009   Td 07/22/2019   Tdap 01/19/2009   Zoster, Live 01/29/2013    Preventative care: Last colonoscopy: 07/2018, next due 2024, Dr. Ramon DredgeEdward   Prior vaccinations: TD or Tdap: 2020  Influenza: 2022 Pneumococcal: 2010 Prevnar13: 2019 Shingles/Zostavax: 2014 suggest shingrix by age 70 Covid 7119: 3/3, 2022  Names of Other Physician/Practitioners you currently use: 1. [facility] here for primary care 2. Dr. Sherrine MaplesGlenn, eye doctor, last visit 2022,  3. Dr. Rondel OhLouo, dentist, last visit 2022  Patient Care Team: Lucky CowboyMcKeown, William, MD as PCP - General (Internal Medicine)  MEDICARE WELLNESS OBJECTIVES: Physical activity:   Cardiac risk factors: Cardiac Risk Factors include: advanced age (>2855men, 85>65 women);dyslipidemia;hypertension;male  gender;obesity (BMI >30kg/m2) Depression/mood screen:      04/10/2022    3:39 PM  Depression screen PHQ 2/9  Decreased Interest 0  Down, Depressed, Hopeless 0  PHQ - 2 Score 0    ADLs:     04/10/2022    3:39 PM 12/10/2021   10:00 PM  In your present state of health, do you have any difficulty performing the following activities:  Hearing? 0 0  Vision? 0 0  Difficulty concentrating or making decisions? 0 0  Walking or climbing stairs? 0 0  Dressing or bathing? 0 0  Doing errands, shopping? 0 0  Preparing Food and eating ? N   Using the Toilet? N   In the past six months, have you accidently leaked urine? N   Do you have problems with loss of bowel control? N   Managing your Medications? N   Managing your Finances? N   Housekeeping or managing your Housekeeping? N      Cognitive Testing  Alert? Yes  Normal Appearance?Yes  Oriented to person? Yes  Place? Yes  Time? Yes  Recall of three objects?  Yes  Can perform simple calculations? Yes  Displays appropriate judgment?Yes  Can read the correct time from a watch face?Yes  EOL planning: Does Patient Have a Medical Advance Directive?: No   Review of Systems:  Review of Systems  Constitutional:  Negative for malaise/fatigue and weight loss.  HENT:  Negative for congestion, hearing loss, sore throat and tinnitus.   Eyes:  Negative for blurred vision and double vision.  Respiratory:  Negative for cough (frequent dry cough during the day), sputum production, shortness of breath and wheezing.   Cardiovascular:  Negative for chest pain, palpitations, orthopnea, claudication and leg swelling.  Gastrointestinal:  Negative for abdominal pain, blood in stool, constipation, diarrhea, heartburn, melena, nausea and vomiting.  Genitourinary: Negative.   Musculoskeletal:  Positive for back pain. Negative for joint pain and myalgias.  Skin:  Negative for rash.  Neurological:  Positive for focal weakness. Negative for dizziness, tingling,  sensory change, weakness and headaches.  Endo/Heme/Allergies:  Positive for environmental allergies. Negative for polydipsia.  Psychiatric/Behavioral: Negative.    All other systems reviewed and are negative.  Physical Exam: BP 110/72   Pulse 72   Temp (!) 97.3 F (36.3 C)   Wt 150 lb 6.4 oz (68.2 kg)   SpO2 95%   BMI 23.56 kg/m  Wt Readings from Last 3 Encounters:  04/10/22 150 lb 6.4 oz (68.2 kg)  12/11/21 149 lb (67.6 kg)  09/05/21 149 lb 6.4 oz (67.8 kg)   General Appearance: Well nourished, in no apparent distress. Eyes: PERRLA, EOMs, conjunctiva no swelling or erythema Sinuses: No Frontal/maxillary tenderness ENT/Mouth: Ext aud canals clear, TMs without erythema, bulging. No erythema, swelling, or exudate on post pharynx.  Tonsils not swollen or erythematous. Hearing normal. Frequent dry brief cough/throat clearing.  Neck: Supple, thyroid normal.  Respiratory: Respiratory effort normal, BS equal bilaterally without rales, rhonchi, wheezing or stridor. Frequent dry cough. Cardio: RRR with no MRGs. Brisk peripheral pulses without edema.  Abdomen: Soft, + BS.  Non tender, no guarding, rebound, hernias, masses. Lymphatics: Non tender without lymphadenopathy.  Musculoskeletal: Full ROM (pain with ROM in lumbar), 5/5 strength, Normal gait, well healed lumbar surgical scar.  Skin: Warm, dry without rashes, lesions, ecchymosis.  Neuro: Cranial nerves intact. No cerebellar symptoms.  Psych: Awake and oriented X 3, normal affect, Insight and Judgment appropriate.    Medicare Attestation I have personally reviewed: The patient's medical and social history Their use of alcohol, tobacco or illicit drugs Their current medications and supplements The patient's functional ability including ADLs,fall risks, home safety risks, cognitive, and hearing and visual impairment Diet and physical activities Evidence for depression or mood disorders  The patient's weight, height, BMI, and  visual acuity have been recorded in the chart.  I have made referrals, counseling, and provided education to the patient based on review of the above and I have provided the patient with a written personalized care plan for preventive services.     Darrol Jump, NP 9:58 AM [facility]

## 2022-04-11 LAB — COMPLETE METABOLIC PANEL WITH GFR
AG Ratio: 1.9 (calc) (ref 1.0–2.5)
ALT: 18 U/L (ref 9–46)
AST: 21 U/L (ref 10–35)
Albumin: 4.1 g/dL (ref 3.6–5.1)
Alkaline phosphatase (APISO): 75 U/L (ref 35–144)
BUN: 20 mg/dL (ref 7–25)
CO2: 28 mmol/L (ref 20–32)
Calcium: 9.3 mg/dL (ref 8.6–10.3)
Chloride: 100 mmol/L (ref 98–110)
Creat: 1.23 mg/dL (ref 0.70–1.35)
Globulin: 2.2 g/dL (calc) (ref 1.9–3.7)
Glucose, Bld: 91 mg/dL (ref 65–99)
Potassium: 4.3 mmol/L (ref 3.5–5.3)
Sodium: 138 mmol/L (ref 135–146)
Total Bilirubin: 1.2 mg/dL (ref 0.2–1.2)
Total Protein: 6.3 g/dL (ref 6.1–8.1)
eGFR: 64 mL/min/{1.73_m2} (ref >=60)

## 2022-04-11 LAB — CBC WITH DIFFERENTIAL/PLATELET
Absolute Monocytes: 845 cells/uL (ref 200–950)
Basophils Absolute: 49 cells/uL (ref 0–200)
Basophils Relative: 0.6 %
Eosinophils Absolute: 49 cells/uL (ref 15–500)
Eosinophils Relative: 0.6 %
HCT: 48.5 % (ref 38.5–50.0)
Hemoglobin: 16.7 g/dL (ref 13.2–17.1)
Lymphs Abs: 1673 cells/uL (ref 850–3900)
MCH: 34.9 pg — ABNORMAL HIGH (ref 27.0–33.0)
MCHC: 34.4 g/dL (ref 32.0–36.0)
MCV: 101.5 fL — ABNORMAL HIGH (ref 80.0–100.0)
MPV: 10.1 fL (ref 7.5–12.5)
Monocytes Relative: 10.3 %
Neutro Abs: 5584 cells/uL (ref 1500–7800)
Neutrophils Relative %: 68.1 %
Platelets: 198 10*3/uL (ref 140–400)
RBC: 4.78 10*6/uL (ref 4.20–5.80)
RDW: 12 % (ref 11.0–15.0)
Total Lymphocyte: 20.4 %
WBC: 8.2 10*3/uL (ref 3.8–10.8)

## 2022-04-15 DIAGNOSIS — M5416 Radiculopathy, lumbar region: Secondary | ICD-10-CM | POA: Diagnosis not present

## 2022-04-15 DIAGNOSIS — S32009S Unspecified fracture of unspecified lumbar vertebra, sequela: Secondary | ICD-10-CM | POA: Diagnosis not present

## 2022-04-15 DIAGNOSIS — M4125 Other idiopathic scoliosis, thoracolumbar region: Secondary | ICD-10-CM | POA: Diagnosis not present

## 2022-04-15 DIAGNOSIS — M47817 Spondylosis without myelopathy or radiculopathy, lumbosacral region: Secondary | ICD-10-CM | POA: Diagnosis not present

## 2022-07-03 DIAGNOSIS — H524 Presbyopia: Secondary | ICD-10-CM | POA: Diagnosis not present

## 2022-07-03 DIAGNOSIS — Z961 Presence of intraocular lens: Secondary | ICD-10-CM | POA: Diagnosis not present

## 2022-07-03 DIAGNOSIS — H04123 Dry eye syndrome of bilateral lacrimal glands: Secondary | ICD-10-CM | POA: Diagnosis not present

## 2022-07-03 DIAGNOSIS — H26493 Other secondary cataract, bilateral: Secondary | ICD-10-CM | POA: Diagnosis not present

## 2022-07-03 DIAGNOSIS — H35033 Hypertensive retinopathy, bilateral: Secondary | ICD-10-CM | POA: Diagnosis not present

## 2022-08-19 DIAGNOSIS — S32009S Unspecified fracture of unspecified lumbar vertebra, sequela: Secondary | ICD-10-CM | POA: Diagnosis not present

## 2022-08-19 DIAGNOSIS — M4125 Other idiopathic scoliosis, thoracolumbar region: Secondary | ICD-10-CM | POA: Diagnosis not present

## 2022-08-19 DIAGNOSIS — M47817 Spondylosis without myelopathy or radiculopathy, lumbosacral region: Secondary | ICD-10-CM | POA: Diagnosis not present

## 2022-08-19 DIAGNOSIS — M5416 Radiculopathy, lumbar region: Secondary | ICD-10-CM | POA: Diagnosis not present

## 2022-08-20 ENCOUNTER — Ambulatory Visit (INDEPENDENT_AMBULATORY_CARE_PROVIDER_SITE_OTHER): Payer: PPO | Admitting: Nurse Practitioner

## 2022-08-20 ENCOUNTER — Encounter: Payer: Self-pay | Admitting: Nurse Practitioner

## 2022-08-20 VITALS — BP 126/80 | HR 64 | Temp 97.7°F | Ht 67.0 in | Wt 149.0 lb

## 2022-08-20 DIAGNOSIS — N182 Chronic kidney disease, stage 2 (mild): Secondary | ICD-10-CM

## 2022-08-20 DIAGNOSIS — I10 Essential (primary) hypertension: Secondary | ICD-10-CM

## 2022-08-20 DIAGNOSIS — Z79899 Other long term (current) drug therapy: Secondary | ICD-10-CM

## 2022-08-20 NOTE — Progress Notes (Signed)
FOLLOW UP   Primary hypertension Controlled Discussed DASH (Dietary Approaches to Stop Hypertension) DASH diet is lower in sodium than a typical American diet. Cut back on foods that are high in saturated fat, cholesterol, and trans fats. Eat more whole-grain foods, fish, poultry, and nuts Remain active and exercise as tolerated daily.  Monitor BP at home-Call if greater than 130/80.  Check and monitor CMP/CBC  CKD Stage 2 Improved from stage 3.  Discussed how what you eat and drink can aide in kidney protection. Stay well hydrated. Avoid high salt foods. Avoid NSAIDS. Keep BP and BG well controlled.   Take medications as prescribed. Remain active and exercise as tolerated daily. Maintain weight.  Continue to monitor. Check and monitor CMP/GFR  Medication management All medications discussed All questions and concerns addressed.  Orders Placed This Encounter  Procedures   CBC with Differential/Platelet   COMPLETE METABOLIC PANEL WITH GFR     Continue diet and meds as discussed. Further disposition pending results of labs. Discussed med's effects and SE's.    Over 20 minutes of exam, counseling, chart review, and critical decision making was performed.   Future Appointments  Date Time Provider Department Center  12/16/2022  3:00 PM Lucky Cowboy, MD [facility] None    HPI 71 y.o. male  presents for .  Overall he reports feeling well.  He has no additional concerns in clinic today   BMI is Body mass index is 23.34 kg/m., he has been working on diet, exercise currently limited as recovering from back surgery.  Wt Readings from Last 3 Encounters:  08/20/22 149 lb (67.6 kg)  04/10/22 150 lb 6.4 oz (68.2 kg)  12/11/21 149 lb (67.6 kg)   His blood pressure has been controlled at home, today their BP is BP: 126/80  He does not workout. He denies chest pain, shortness of breath, dizziness.  He has CKD III followed by nephrology, [facility],  Lab Results   Component Value Date   GFRNONAA 49 (L) 06/07/2021   GFRNONAA 51 (L) 03/01/2021   GFRNONAA 48 (L) 11/23/2020     He has been working on diet and exercise for glucose management, and denies foot ulcerations, nausea, polydipsia, polyuria, visual disturbances, vomiting and weight loss. Last A1C in the office was:   Current Medications:   Current Outpatient Medications (Endocrine & Metabolic):    dexamethasone (DECADRON) 4 MG tablet, Take 1 tab 3 x /day for 2 days,      then 2 x /day for 2  Days,     then 1 tab daily  Current Outpatient Medications (Cardiovascular):    atenolol (TENORMIN) 50 MG tablet, TAKE 1 TABLET BY MOUTH DAILY FOR BLOOD PRESSURE   atorvastatin (LIPITOR) 40 MG tablet, TAKE 1 TABLET BY MOUTH DAILY FOR CHOLESTEROL   losartan (COZAAR) 50 MG tablet, Take 50 mg by mouth daily.  Current Outpatient Medications (Respiratory):    cetirizine (ZYRTEC) 10 MG tablet, Take 10 mg by mouth daily. (Patient not taking: Reported on 08/20/2022)   pseudoephedrine (SUDAFED) 120 MG 12 hr tablet, Take  1 tablet  2 x /day (every 12 hours)  for Head and Chest Congestion (Patient not taking: Reported on 04/10/2022)  Current Outpatient Medications (Analgesics):    acetaminophen (TYLENOL) 500 MG tablet, Take 500 mg by mouth every 6 (six) hours as needed.    Current Outpatient Medications (Other):    Cholecalciferol (VITAMIN D PO), Take 2,000 Units by mouth daily.   esomeprazole (NEXIUM) 40 MG capsule, TAKE  ONE CAPSULE DAILY AT NOON FOR REFLUX.   finasteride (PROSCAR) 5 MG tablet, Take 1 tablet (5 mg total) by mouth daily.   LORazepam (ATIVAN) 1 MG tablet, Take   1/2 - 1 tablet    at Bedtime   ONLY   if needed for Sleep &  limit to 5 days /week to avoid Addiction & Dementia   valACYclovir (VALTREX) 500 MG tablet, Take 1 tablet (500 mg total) by mouth 3 (three) times daily. 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 FAMILY HISTORY His family history includes COPD in his father; Diabetes in his brother; Heart disease in his father; Osteoporosis in his mother; Schizophrenia in his mother. SOCIAL HISTORY He  reports that he has never smoked. He has never used smokeless tobacco. He reports current alcohol use. He reports that he does not use drugs.  Immunization History  Administered Date(s) Administered   Influenza Split 09/22/2013   Influenza, High Dose Seasonal PF 10/22/2019   Influenza-Unspecified 09/08/2017, 08/19/2018, 09/24/2020   PFIZER(Purple Top)SARS-COV-2 Vaccination 10/25/2020   PPD Test 09/13/2014, 07/25/2015, 08/08/2016, 09/08/2017, 10/09/2018   Pneumococcal Conjugate-13 01/22/2018, 10/22/2019   Pneumococcal Polysaccharide-23 01/19/2009   Td 07/22/2019   Tdap 01/19/2009   Zoster, Live 01/29/2013   Patient Care Team: Lucky Cowboy, MD as PCP - General (Internal Medicine)   Review of Systems:  Review of Systems  Constitutional:  Negative for malaise/fatigue and weight loss.  HENT:  Negative for congestion, hearing loss, sore throat and tinnitus.   Eyes:  Negative for blurred vision and double vision.  Respiratory:  Negative for cough, sputum production, shortness of breath and wheezing.   Cardiovascular:  Negative for chest pain, palpitations, orthopnea, claudication and leg swelling.  Gastrointestinal:  Negative for abdominal pain, blood in stool, constipation, diarrhea,  heartburn, melena, nausea and vomiting.  Genitourinary: Negative.   Musculoskeletal:  Negative for back pain, joint pain and myalgias.  Skin:  Negative for rash.  Neurological:  Negative for dizziness, tingling, sensory change, focal weakness, weakness and headaches.  Endo/Heme/Allergies:  Negative for environmental allergies and polydipsia.  Psychiatric/Behavioral: Negative.    All other systems reviewed and are negative.   Physical Exam: BP 126/80   Pulse 64   Temp 97.7 F (36.5 C)   Ht 5\' 7"  (1.702 m)   Wt 149 lb (67.6 kg)   SpO2 97%   BMI 23.34 kg/m  Wt Readings from Last 3 Encounters:  08/20/22 149 lb (67.6 kg)  04/10/22 150 lb 6.4 oz (68.2 kg)  12/11/21 149 lb (67.6 kg)   General Appearance: Well nourished, in no apparent distress. Eyes: PERRLA, EOMs, conjunctiva no swelling or erythema Sinuses: No Frontal/maxillary tenderness ENT/Mouth: Ext aud canals clear, TMs without erythema, bulging. No erythema, swelling, or exudate on post pharynx.  Tonsils not swollen or erythematous. Hearing normal. Frequent dry brief cough/throat clearing.  Neck: Supple, thyroid  normal.  Respiratory: Respiratory effort normal, BS equal bilaterally without rales, rhonchi, wheezing or stridor. Frequent dry cough. Cardio: RRR with no MRGs. Brisk peripheral pulses without edema.  Abdomen: Soft, + BS.  Non tender, no guarding, rebound, hernias, masses. Lymphatics: Non tender without lymphadenopathy.  Musculoskeletal: Full ROM (pain with ROM in lumbar), 5/5 strength, Normal gait, well healed lumbar surgical scar.  Skin: Warm, dry without rashes, lesions, ecchymosis.  Neuro: Cranial nerves intact. No cerebellar symptoms.  Psych: Awake and oriented X 3, normal affect, Insight and Judgment appropriate.    Darrol Jump, NP 4:03 PM [facility]

## 2022-08-21 LAB — COMPLETE METABOLIC PANEL WITH GFR
AG Ratio: 1.9 (calc) (ref 1.0–2.5)
ALT: 24 U/L (ref 9–46)
AST: 22 U/L (ref 10–35)
Albumin: 4.2 g/dL (ref 3.6–5.1)
Alkaline phosphatase (APISO): 60 U/L (ref 35–144)
BUN/Creatinine Ratio: 18 (calc) (ref 6–22)
BUN: 24 mg/dL (ref 7–25)
CO2: 26 mmol/L (ref 20–32)
Calcium: 9.3 mg/dL (ref 8.6–10.3)
Chloride: 100 mmol/L (ref 98–110)
Creat: 1.36 mg/dL — ABNORMAL HIGH (ref 0.70–1.28)
Globulin: 2.2 g/dL (calc) (ref 1.9–3.7)
Glucose, Bld: 89 mg/dL (ref 65–99)
Potassium: 4.2 mmol/L (ref 3.5–5.3)
Sodium: 137 mmol/L (ref 135–146)
Total Bilirubin: 1 mg/dL (ref 0.2–1.2)
Total Protein: 6.4 g/dL (ref 6.1–8.1)
eGFR: 56 mL/min/{1.73_m2} — ABNORMAL LOW (ref >=60)

## 2022-08-21 LAB — CBC WITH DIFFERENTIAL/PLATELET
Absolute Monocytes: 969 cells/uL — ABNORMAL HIGH (ref 200–950)
Basophils Absolute: 67 cells/uL (ref 0–200)
Basophils Relative: 0.7 %
Eosinophils Absolute: 86 cells/uL (ref 15–500)
Eosinophils Relative: 0.9 %
HCT: 48.7 % (ref 38.5–50.0)
Hemoglobin: 17.1 g/dL (ref 13.2–17.1)
Lymphs Abs: 1539 cells/uL (ref 850–3900)
MCH: 35.5 pg — ABNORMAL HIGH (ref 27.0–33.0)
MCHC: 35.1 g/dL (ref 32.0–36.0)
MCV: 101 fL — ABNORMAL HIGH (ref 80.0–100.0)
MPV: 10.2 fL (ref 7.5–12.5)
Monocytes Relative: 10.2 %
Neutro Abs: 6840 cells/uL (ref 1500–7800)
Neutrophils Relative %: 72 %
Platelets: 219 10*3/uL (ref 140–400)
RBC: 4.82 10*6/uL (ref 4.20–5.80)
RDW: 12.1 % (ref 11.0–15.0)
Total Lymphocyte: 16.2 %
WBC: 9.5 10*3/uL (ref 3.8–10.8)

## 2022-10-21 ENCOUNTER — Encounter: Payer: Self-pay | Admitting: Nurse Practitioner

## 2022-12-15 ENCOUNTER — Encounter: Payer: Self-pay | Admitting: Internal Medicine

## 2022-12-15 NOTE — Progress Notes (Signed)
Annual  Screening/Preventative Visit  & Comprehensive Evaluation & Examination   Future Appointments  Date Time Provider Department  12/16/2022  3:00 PM Unk Pinto, MD [facility]  12/18/2023 11:00 AM Unk Pinto, MD [facility]              This very nice 72 y.o. MWM presents for a Screening /Preventative Visit & comprehensive evaluation and management of multiple medical co-morbidities.  Patient has been followed for HTN, HLD, Prediabetes and Vitamin D Deficiency  who also has hx/o back surgeries and chronic pain syndrome.         Patient relates onset of his back pain to fall from a ladder in 2016.  He had several subsequent neg MRI exams and also failed trials with EDSI, dry needling, Physical therapy and Lyrica /Gabapentin In Dec 2020, patient had a laminectomy by Dr Ronnald Ramp.              HTN predates since 85 Patient's BP has been controlled at home.  Patient has CKD3a (GFR 57) and is followed bu Nephrology ( Dr Carolin Sicks) .Today's BP is at goal - 122/76. Patient denies any cardiac symptoms as chest pain, palpitations, shortness of breath, dizziness or ankle swelling.        Patient's hyperlipidemia is controlled with diet and medications. Patient denies myalgias or other medication SE's. Last lipids were  at goal :  Lab Results  Component Value Date   CHOL 164 12/11/2021   HDL 86 12/11/2021   LDLCALC 60 12/11/2021   TRIG 98 12/11/2021   CHOLHDL 1.9 12/11/2021         Patient has hx/o  prediabetes  & Insulin Resistance (Insulin 28.6 /2016 & 33.7 / 2018) and patient denies reactive hypoglycemic symptoms, visual blurring, diabetic polys or paresthesias. Last A1c was at goal :   Lab Results  Component Value Date   HGBA1C 5.1 12/11/2021         Finally, patient has history of Vitamin D Deficiency  ( 28" /2008) and last vitamin D was at goal:   Lab Results  Component Value Date   VD25OH 56 12/11/2021       Current Outpatient Medications  Medication  Instructions   acetaminophen  500 mg, Oral, Every 6 hours PRN   atenolol 50 MG tablet TAKE 1 TABLET  DAILY    atorvastatin  40 MG tablet TAKE 1 TABLET  DAILY    VITAMIN D  2,000 Units Daily   esomeprazole  40 MG capsule TAKE ONE CAPSULE DAILY    finasteride  5 mg, Oral, Daily   LORazepam  1 MG tablet Take   1/2 - 1 tab  at Bedtime ONLY if needed   losartan  50 mg Daily   valACYclovir  500 mg 3 times daily AS Needed     Allergies  Allergen Reactions   Nsaids     Renal Insufficiency     Past Medical History:  Diagnosis Date   Arthritis    right thumb   Avascular necrosis of left femoral head (HCC) 10/28/2014   Dry eyes    Takes Restasis   GERD (gastroesophageal reflux disease)    Hyperlipidemia    Hypertension    Vitamin D deficiency      Health Maintenance  Topic Date Due   Zoster Vaccines- Shingrix (1 of 2) Never done   Pneumonia Vaccine 59+ Years old (3 - PPSV23 if available, else PCV20) 10/21/2020   COVID-19 Vaccine (2 - Pfizer risk  series) 11/15/2020   INFLUENZA VACCINE  07/09/2021   TETANUS/TDAP  07/21/2029   Hepatitis C Screening  Completed   HPV VACCINES  Aged Out     Immunization History  Administered Date(s) Administered   Influenza Split 09/22/2013   Influenza, High Dose  10/22/2019   Influenza 09/08/2017, 08/19/2018, 09/24/2020   PFIZER SARS-COV-2 Vacc 10/25/2020   PPD Test 09/13/2014, 07/25/2015, 08/08/2016, 09/08/2017, 10/09/2018   Pneumococcal -13 01/22/2018, 10/22/2019   Pneumococcal -23 01/19/2009   Td 07/22/2019   Tdap 01/19/2009   Zoster, Live 01/29/2013    Last Colon - 07/14/2018 - Dr Randa Evens - Negative - Recc 10 yr f/u due Aug 2029    Past Surgical History:  Procedure Laterality Date   BACK SURGERY  11/19/2019   L3-4 R foraminectomy, Dr. [name]   COLONOSCOPY W/ POLYPECTOMY     ESOPHAGOGASTRODUODENOSCOPY  1990   TOTAL HIP ARTHROPLASTY Left 10/28/2014   Procedure: TOTAL HIP ARTHROPLASTY ANTERIOR APPROACH;  Surgeon: Eldred Manges,  MD;  Location: [facility] OR;  Service: Orthopedics;  Laterality: Left;     Family History  Problem Relation Age of Onset   Osteoporosis Mother    Schizophrenia Mother    Heart disease Father    COPD Father    Diabetes Brother      Social History   Tobacco Use   Smoking status: Never   Smokeless tobacco: Never  Substance Use Topics   Alcohol use: Yes    Comment: social   Drug use: No      ROS Constitutional: Denies fever, chills, weight loss/gain, headaches, insomnia,  night sweats or change in appetite. Does c/o fatigue. Eyes: Denies redness, blurred vision, diplopia, discharge, itchy or watery eyes.  ENT: Denies discharge, congestion, post nasal drip, epistaxis, sore throat, earache, hearing loss, dental pain, Tinnitus, Vertigo, Sinus pain or snoring.  Cardio: Denies chest pain, palpitations, irregular heartbeat, syncope, dyspnea, diaphoresis, orthopnea, PND, claudication or edema Respiratory: denies cough, dyspnea, DOE, pleurisy, hoarseness, laryngitis or wheezing.  Gastrointestinal: Denies dysphagia, heartburn, reflux, water brash, pain, cramps, nausea, vomiting, bloating, diarrhea, constipation, hematemesis, melena, hematochezia, jaundice or hemorrhoids Genitourinary: Denies dysuria, frequency, urgency, nocturia, hesitancy, discharge, hematuria or flank pain Musculoskeletal: Denies arthralgia, myalgia, stiffness, Jt. Swelling, pain, limp or strain/sprain. Denies Falls. Skin: Denies puritis, rash, hives, warts, acne, eczema or change in skin lesion Neuro: No weakness, tremor, incoordination, spasms, paresthesia or pain Psychiatric: Denies confusion, memory loss or sensory loss. Denies Depression. Endocrine: Denies change in weight, skin, hair change, nocturia, and paresthesia, diabetic polys, visual blurring or hyper / hypo glycemic episodes.  Heme/Lymph: No excessive bleeding, bruising or enlarged lymph nodes.   Physical Exam  There were no vitals taken for this  visit.  General Appearance: Well nourished and well groomed and in no apparent distress.  Eyes: PERRLA, EOMs, conjunctiva no swelling or erythema, normal fundi and vessels. Sinuses: No frontal/maxillary tenderness ENT/Mouth: EACs patent / TMs  nl. Nares clear without erythema, swelling, mucoid exudates. Oral hygiene is good. No erythema, swelling, or exudate. Tongue normal, non-obstructing. Tonsils not swollen or erythematous. Hearing normal.  Neck: Supple, thyroid not palpable. No bruits, nodes or JVD. Respiratory: Respiratory effort normal.  BS equal and clear bilateral without rales, rhonci, wheezing or stridor. Cardio: Heart sounds are normal with regular rate and rhythm and no murmurs, rubs or gallops. Peripheral pulses are normal and equal bilaterally without edema. No aortic or femoral bruits. Chest: symmetric with normal excursions and percussion.  Abdomen: Soft, with Nl bowel sounds. Nontender, no  guarding, rebound, hernias, masses, or organomegaly.  Lymphatics: Non tender without lymphadenopathy.  Musculoskeletal: Full ROM all peripheral extremities, joint stability, 5/5 strength, and normal gait. Skin: Warm and dry without rashes, lesions, cyanosis, clubbing or  ecchymosis.  Neuro: Cranial nerves intact, reflexes equal bilaterally. Normal muscle tone, no cerebellar symptoms. Sensation intact.  Pysch: Alert and oriented x 3 with normal affect, insight and judgment appropriate.   Assessment and Plan  1. Encounter for general adult medical examination with abnormal findings   2. Labile hypertension  - EKG 12-Lead - [place], RETROPERITNL ABD,  LTD - Urinalysis, Routine w reflex microscopic - Microalbumin / creatinine urine ratio - CBC with Differential/Platelet - COMPLETE METABOLIC PANEL WITH GFR - Magnesium - TSH   3. Hyperlipidemia, mixed  - EKG 12-Lead - [place], RETROPERITNL ABD,  LTD - Lipid panel - TSH   4. Abnormal glucose  - EKG 12-Lead - [place], RETROPERITNL ABD,   LTD - Hemoglobin A1c - Insulin, random   5. Vitamin D deficiency  - VITAMIN D 25 Hydroxy   6. Stage 3b chronic kidney disease (HCC)  - Urinalysis, Routine w reflex microscopic - Microalbumin / creatinine urine ratio - PTH, intact and calcium - COMPLETE METABOLIC PANEL WITH GFR  7. Idiopathic gout, hx  - Uric acid   8. BPH with obstruction/lower urinary tract symptoms  - PSA  \ 9. Chronic low back pain with sciatica   10. Screening for colorectal cancer  - POC Hemoccult Bld/Stl    11. Prostate cancer screening  - PSA   12. Screening for heart disease  - EKG 12-Lead   13. FHx: heart disease  - EKG 12-Lead - [place], RETROPERITNL ABD,  LTD   14. Screening for AAA (aortic abdominal aneurysm)  - [place], RETROPERITNL ABD,  LTD   15. Medication management  - Urinalysis, Routine w reflex microscopic - Microalbumin / creatinine urine ratio - CBC with Differential/Platelet - COMPLETE METABOLIC PANEL WITH GFR - Magnesium - Lipid panel - TSH - Hemoglobin A1c - Insulin, random - VITAMIN D 25 Hydroxy  - Uric acid           Patient was counseled in prudent diet, weight control to achieve/maintain BMI less than 25, BP monitoring, regular exercise and medications as discussed.  Discussed med effects and SE's. Routine screening labs and tests as requested with regular follow-up as recommended. Over 40 minutes of exam, counseling, chart review and high complex critical decision making was performed   Marinus Maw, MD

## 2022-12-15 NOTE — Patient Instructions (Signed)

## 2022-12-16 ENCOUNTER — Ambulatory Visit (INDEPENDENT_AMBULATORY_CARE_PROVIDER_SITE_OTHER): Payer: PPO | Admitting: Internal Medicine

## 2022-12-16 ENCOUNTER — Encounter: Payer: Self-pay | Admitting: Internal Medicine

## 2022-12-16 VITALS — BP 120/70 | HR 53 | Temp 97.9°F | Resp 16 | Ht 67.0 in | Wt 150.4 lb

## 2022-12-16 DIAGNOSIS — G8929 Other chronic pain: Secondary | ICD-10-CM

## 2022-12-16 DIAGNOSIS — I700 Atherosclerosis of aorta: Secondary | ICD-10-CM | POA: Diagnosis not present

## 2022-12-16 DIAGNOSIS — Z125 Encounter for screening for malignant neoplasm of prostate: Secondary | ICD-10-CM

## 2022-12-16 DIAGNOSIS — N1832 Chronic kidney disease, stage 3b: Secondary | ICD-10-CM

## 2022-12-16 DIAGNOSIS — Z79899 Other long term (current) drug therapy: Secondary | ICD-10-CM

## 2022-12-16 DIAGNOSIS — E559 Vitamin D deficiency, unspecified: Secondary | ICD-10-CM

## 2022-12-16 DIAGNOSIS — N138 Other obstructive and reflux uropathy: Secondary | ICD-10-CM | POA: Diagnosis not present

## 2022-12-16 DIAGNOSIS — M4125 Other idiopathic scoliosis, thoracolumbar region: Secondary | ICD-10-CM | POA: Diagnosis not present

## 2022-12-16 DIAGNOSIS — M47817 Spondylosis without myelopathy or radiculopathy, lumbosacral region: Secondary | ICD-10-CM | POA: Diagnosis not present

## 2022-12-16 DIAGNOSIS — M1000 Idiopathic gout, unspecified site: Secondary | ICD-10-CM | POA: Diagnosis not present

## 2022-12-16 DIAGNOSIS — Z136 Encounter for screening for cardiovascular disorders: Secondary | ICD-10-CM | POA: Diagnosis not present

## 2022-12-16 DIAGNOSIS — Z0000 Encounter for general adult medical examination without abnormal findings: Secondary | ICD-10-CM | POA: Diagnosis not present

## 2022-12-16 DIAGNOSIS — K219 Gastro-esophageal reflux disease without esophagitis: Secondary | ICD-10-CM

## 2022-12-16 DIAGNOSIS — R0989 Other specified symptoms and signs involving the circulatory and respiratory systems: Secondary | ICD-10-CM

## 2022-12-16 DIAGNOSIS — R7309 Other abnormal glucose: Secondary | ICD-10-CM | POA: Diagnosis not present

## 2022-12-16 DIAGNOSIS — Z1211 Encounter for screening for malignant neoplasm of colon: Secondary | ICD-10-CM

## 2022-12-16 DIAGNOSIS — Z8249 Family history of ischemic heart disease and other diseases of the circulatory system: Secondary | ICD-10-CM

## 2022-12-16 DIAGNOSIS — N401 Enlarged prostate with lower urinary tract symptoms: Secondary | ICD-10-CM | POA: Diagnosis not present

## 2022-12-16 DIAGNOSIS — E782 Mixed hyperlipidemia: Secondary | ICD-10-CM | POA: Diagnosis not present

## 2022-12-16 DIAGNOSIS — M5416 Radiculopathy, lumbar region: Secondary | ICD-10-CM | POA: Diagnosis not present

## 2022-12-16 DIAGNOSIS — Z0001 Encounter for general adult medical examination with abnormal findings: Secondary | ICD-10-CM

## 2022-12-16 DIAGNOSIS — S32009S Unspecified fracture of unspecified lumbar vertebra, sequela: Secondary | ICD-10-CM | POA: Diagnosis not present

## 2022-12-16 MED ORDER — DEXAMETHASONE 4 MG PO TABS: ORAL_TABLET | ORAL | 2 refills | Status: DC

## 2022-12-16 MED ORDER — ESOMEPRAZOLE MAGNESIUM 40 MG PO CPDR: DELAYED_RELEASE_CAPSULE | ORAL | 3 refills | Status: DC

## 2022-12-17 LAB — COMPLETE METABOLIC PANEL WITH GFR
AG Ratio: 1.8 (calc) (ref 1.0–2.5)
ALT: 27 U/L (ref 9–46)
AST: 26 U/L (ref 10–35)
Albumin: 4.4 g/dL (ref 3.6–5.1)
Alkaline phosphatase (APISO): 61 U/L (ref 35–144)
BUN: 20 mg/dL (ref 7–25)
CO2: 28 mmol/L (ref 20–32)
Calcium: 9.1 mg/dL (ref 8.6–10.3)
Chloride: 100 mmol/L (ref 98–110)
Creat: 1.15 mg/dL (ref 0.70–1.28)
Globulin: 2.4 g/dL (calc) (ref 1.9–3.7)
Glucose, Bld: 80 mg/dL (ref 65–99)
Potassium: 4 mmol/L (ref 3.5–5.3)
Sodium: 139 mmol/L (ref 135–146)
Total Bilirubin: 0.9 mg/dL (ref 0.2–1.2)
Total Protein: 6.8 g/dL (ref 6.1–8.1)
eGFR: 68 mL/min/{1.73_m2} (ref >=60)

## 2022-12-17 LAB — CBC WITH DIFFERENTIAL/PLATELET
Absolute Monocytes: 909 cells/uL (ref 200–950)
Basophils Absolute: 101 cells/uL (ref 0–200)
Basophils Relative: 1 %
Eosinophils Absolute: 81 cells/uL (ref 15–500)
Eosinophils Relative: 0.8 %
HCT: 47.1 % (ref 38.5–50.0)
Hemoglobin: 16.7 g/dL (ref 13.2–17.1)
Lymphs Abs: 1848 cells/uL (ref 850–3900)
MCH: 34.8 pg — ABNORMAL HIGH (ref 27.0–33.0)
MCHC: 35.5 g/dL (ref 32.0–36.0)
MCV: 98.1 fL (ref 80.0–100.0)
MPV: 9.8 fL (ref 7.5–12.5)
Monocytes Relative: 9 %
Neutro Abs: 7161 cells/uL (ref 1500–7800)
Neutrophils Relative %: 70.9 %
Platelets: 207 10*3/uL (ref 140–400)
RBC: 4.8 10*6/uL (ref 4.20–5.80)
RDW: 11.7 % (ref 11.0–15.0)
Total Lymphocyte: 18.3 %
WBC: 10.1 10*3/uL (ref 3.8–10.8)

## 2022-12-17 LAB — PTH, INTACT AND CALCIUM
Calcium: 9.1 mg/dL (ref 8.6–10.3)
PTH: 58 pg/mL (ref 16–77)

## 2022-12-17 LAB — MICROALBUMIN / CREATININE URINE RATIO
Creatinine, Urine: 32 mg/dL (ref 20–320)
Microalb, Ur: <0.2 mg/dL

## 2022-12-17 LAB — TSH: TSH: 1.11 mIU/L (ref 0.40–4.50)

## 2022-12-17 LAB — URINALYSIS, ROUTINE W REFLEX MICROSCOPIC
Bilirubin Urine: NEGATIVE
Glucose, UA: NEGATIVE
Hgb urine dipstick: NEGATIVE
Ketones, ur: NEGATIVE
Leukocytes,Ua: NEGATIVE
Nitrite: NEGATIVE
Protein, ur: NEGATIVE
Specific Gravity, Urine: 1.005 (ref 1.001–1.035)
pH: 7 (ref 5.0–8.0)

## 2022-12-17 LAB — VITAMIN D 25 HYDROXY (VIT D DEFICIENCY, FRACTURES): Vit D, 25-Hydroxy: 56 ng/mL (ref 30–100)

## 2022-12-17 LAB — HEMOGLOBIN A1C
Hgb A1c MFr Bld: 5.2 % of total Hgb (ref <5.7)
Mean Plasma Glucose: 103 mg/dL
eAG (mmol/L): 5.7 mmol/L

## 2022-12-17 LAB — LIPID PANEL
Cholesterol: 168 mg/dL (ref <200)
HDL: 70 mg/dL (ref >=40)
LDL Cholesterol (Calc): 68 mg/dL (calc)
Non-HDL Cholesterol (Calc): 98 mg/dL (calc) (ref <130)
Total CHOL/HDL Ratio: 2.4 (calc) (ref <5.0)
Triglycerides: 238 mg/dL — ABNORMAL HIGH (ref <150)

## 2022-12-17 LAB — MAGNESIUM: Magnesium: 2 mg/dL (ref 1.5–2.5)

## 2022-12-17 LAB — PSA: PSA: 0.49 ng/mL (ref <=4.00)

## 2022-12-17 LAB — URIC ACID: Uric Acid, Serum: 6.5 mg/dL (ref 4.0–8.0)

## 2022-12-17 LAB — INSULIN, RANDOM: Insulin: 18.1 u[IU]/mL

## 2022-12-17 NOTE — Progress Notes (Signed)
<><><><><><><><><><><><><><><><><><><><><><><><><><><><><><><><><> <><><><><><><><><><><><><><><><><><><><><><><><><><><><><><><><><> -   Test results slightly outside the reference range are not unusual. If there is anything important, I will review this with you,  otherwise it is considered normal test values.  If you have further questions,  please do not hesitate to contact me at the office or via My Chart.  <><><><><><><><><><><><><><><><><><><><><><><><><><><><><><><><><> <><><><><><><><><><><><><><><><><><><><><><><><><><><><><><><><><>  -  Total Chol = 168  &   LDL Chol = 68 - Both   Excellent   - Very low risk for Heart Attack  / Stroke <><><><><><><><><><><><><><><><><><><><><><><><><><><><><><><><><> <><><><><><><><><><><><><><><><><><><><><><><><><><><><><><><><><>  -  But Triglycerides (  238  ) or fats in blood are too high                 (   Ideal or  Goal is less than 150  !  )    - Recommend avoid fried & greasy foods,  sweets / candy,   - Avoid white rice  (brown or wild rice or Quinoa is OK),   - Avoid white potatoes  (sweet potatoes are OK)   - Avoid anything made from white flour  - bagels, doughnuts, rolls, buns, biscuits, white and   wheat breads, pizza crust and traditional  pasta made of white flour & egg white  - (vegetarian pasta or spinach or wheat pasta is OK).    - Multi-grain bread is OK - like multi-grain flat bread or  sandwich thins.   - Avoid alcohol in excess.   - Exercise is also important. <><><><><><><><><><><><><><><><><><><><><><><><><><><><><><><><><> <><><><><><><><><><><><><><><><><><><><><><><><><><><><><><><><><>  -  PSA - Low - neg for Prostate Cancer  - Great  ! <><><><><><><><><><><><><><><><><><><><><><><><><><><><><><><><><> <><><><><><><><><><><><><><><><><><><><><><><><><><><><><><><><><>  -  PTH is hormone that regulates calcium balance & is Normal   <><><><><><><><><><><><><><><><><><><><><><><><><><><><><><><><><> <><><><><><><><><><><><><><><><><><><><><><><><><><><><><><><><><>  -  A1c - Normal - No Diabetes  - Great  ! <><><><><><><><><><><><><><><><><><><><><><><><><><><><><><><><><> <><><><><><><><><><><><><><><><><><><><><><><><><><><><><><><><><>  -  Vitamin D= 56 - OK - Please keep dose same  <><><><><><><><><><><><><><><><><><><><><><><><><><><><><><><><><> <><><><><><><><><><><><><><><><><><><><><><><><><><><><><><><><><>  -  Uric acid / Gout test is normal - Great - Please continue Allopurinol same  <><><><><><><><><><><><><><><><><><><><><><><><><><><><><><><><><> <><><><><><><><><><><><><><><><><><><><><><><><><><><><><><><><><>  -  All Else - CBC - Kidneys - Electrolytes - Liver - Magnesium & Thyroid    - all  Normal / OK <><><><><><><><><><><><><><><><><><><><><><><><><><><><><><><><><> <><><><><><><><><><><><><><><><><><><><><><><><><><><><><><><><><>

## 2023-01-23 DIAGNOSIS — I129 Hypertensive chronic kidney disease with stage 1 through stage 4 chronic kidney disease, or unspecified chronic kidney disease: Secondary | ICD-10-CM | POA: Diagnosis not present

## 2023-01-23 DIAGNOSIS — N1831 Chronic kidney disease, stage 3a: Secondary | ICD-10-CM | POA: Diagnosis not present

## 2023-01-23 DIAGNOSIS — E673 Hypervitaminosis D: Secondary | ICD-10-CM | POA: Diagnosis not present

## 2023-02-27 ENCOUNTER — Other Ambulatory Visit: Payer: Self-pay

## 2023-02-27 DIAGNOSIS — E782 Mixed hyperlipidemia: Secondary | ICD-10-CM

## 2023-02-27 MED ORDER — ATORVASTATIN CALCIUM 40 MG PO TABS: ORAL_TABLET | ORAL | 3 refills | Status: DC

## 2023-03-18 ENCOUNTER — Ambulatory Visit (INDEPENDENT_AMBULATORY_CARE_PROVIDER_SITE_OTHER): Payer: PPO | Admitting: Nurse Practitioner

## 2023-03-18 VITALS — BP 148/72 | HR 60 | Temp 97.8°F | Ht 67.0 in | Wt 153.0 lb

## 2023-03-18 DIAGNOSIS — F988 Other specified behavioral and emotional disorders with onset usually occurring in childhood and adolescence: Secondary | ICD-10-CM | POA: Diagnosis not present

## 2023-03-18 DIAGNOSIS — E782 Mixed hyperlipidemia: Secondary | ICD-10-CM

## 2023-03-18 DIAGNOSIS — N401 Enlarged prostate with lower urinary tract symptoms: Secondary | ICD-10-CM

## 2023-03-18 DIAGNOSIS — N1832 Chronic kidney disease, stage 3b: Secondary | ICD-10-CM | POA: Diagnosis not present

## 2023-03-18 DIAGNOSIS — B001 Herpesviral vesicular dermatitis: Secondary | ICD-10-CM

## 2023-03-18 DIAGNOSIS — N138 Other obstructive and reflux uropathy: Secondary | ICD-10-CM

## 2023-03-18 DIAGNOSIS — Z0001 Encounter for general adult medical examination with abnormal findings: Secondary | ICD-10-CM

## 2023-03-18 DIAGNOSIS — R6889 Other general symptoms and signs: Secondary | ICD-10-CM

## 2023-03-18 DIAGNOSIS — M1000 Idiopathic gout, unspecified site: Secondary | ICD-10-CM | POA: Diagnosis not present

## 2023-03-18 DIAGNOSIS — M5137 Other intervertebral disc degeneration, lumbosacral region: Secondary | ICD-10-CM | POA: Diagnosis not present

## 2023-03-18 DIAGNOSIS — E559 Vitamin D deficiency, unspecified: Secondary | ICD-10-CM

## 2023-03-18 DIAGNOSIS — Z79899 Other long term (current) drug therapy: Secondary | ICD-10-CM

## 2023-03-18 DIAGNOSIS — Z0000 Encounter for general adult medical examination without abnormal findings: Secondary | ICD-10-CM

## 2023-03-18 DIAGNOSIS — R0989 Other specified symptoms and signs involving the circulatory and respiratory systems: Secondary | ICD-10-CM | POA: Diagnosis not present

## 2023-03-18 DIAGNOSIS — J3089 Other allergic rhinitis: Secondary | ICD-10-CM

## 2023-03-18 DIAGNOSIS — K219 Gastro-esophageal reflux disease without esophagitis: Secondary | ICD-10-CM

## 2023-03-18 DIAGNOSIS — G4700 Insomnia, unspecified: Secondary | ICD-10-CM | POA: Diagnosis not present

## 2023-03-18 NOTE — Progress Notes (Signed)
WELLNESS VISIT  & 6 MONTH FOLLOW UP  [name] was seen today for a follow up  Diagnostics and Plan of care are noted below:  Encounter for Medicare annual wellness exam Due Annually Health maintenance reviewed  HTN Above goal in clinic Continue Atenolol, Losartan Discussed DASH (Dietary Approaches to Stop Hypertension) DASH diet is lower in sodium than a typical American diet. Cut back on foods that are high in saturated fat, cholesterol, and trans fats. Eat more whole-grain foods, fish, poultry, and nuts Remain active and exercise as tolerated daily.  Monitor BP at home-Call if consistently greater than 130/80.  Check CMP/CBC  Non-seasonal allergic rhinitis, unspecified trigger Continue antihistamine Sample Norel AD provided Hygiene reviewed; avoid trigger  Gastroesophageal reflux disease, unspecified whether esophagitis present/Screening for colon cancer Increased symptoms - Continue Nexium 40 mg daily Suggest if s/s fail to improve, obtain EGD when colonoscopy due 07/2023 No suspected reflux complications (Barret/stricture). Lifestyle modification:  wt loss, avoid meals 2-3h before bedtime. Consider eliminating food triggers:  chocolate, caffeine, EtOH, acid/spicy food.  DDD (degenerative disc disease), lumbosacral Continue follow up neurosurgery/Dr. Phillips/ pain management as needed  Stage 3b chronic kidney disease (HCC) Discussed how what you eat and drink can aide in kidney protection. Stay well hydrated. Avoid high salt foods. Avoid NSAIDS. Keep BP and BG well controlled.   Take medications as prescribed. Remain active and exercise as tolerated daily. Maintain weight.  Continue to monitor. Check CMP/GFR/Microablumin  Hyperlipidemia, unspecified hyperlipidemia type Discussed lifestyle modifications. Recommended diet heavy in fruits and veggies, omega 3's. Decrease consumption of animal meats, cheeses, and dairy products. Remain active and exercise as  tolerated. Continue to monitor. Check lipids/TSH  Vitamin D deficiency Continue supplement for goal of 60-100 Monitor Vitamin D levels  Insomnia, unspecified type Discussed good sleep hygiene. Establish bed and wake times. Sleep restriction-only sleep estimated hrs sleep. Bed only for sex and sleep, only sleep when sleepy, out of bed if anxious (stimulus control). Reviewed relaxation techniques, mindful meditations. Expected sleep duration. Addressed worries about not sleeping.   Attention deficit disorder, unspecified hyperactivity presence Medications providing over 30% improvement of attention deficit hyperactivity disorder. No change in management. Using lowest effective dose. Discussed alternative pharmacological and non-pharmacological therapies. No suspected aberrant drug-taking behaviors. Continue to monitor  Medication management All medications discussed All questions and concerns addressed.  Gout  No recent flare - controlled Discussed low purine diet  BPH Controlled Continue Proscar Continue to monitor  Orders Placed This Encounter  Procedures  . CBC with Differential/Platelet  . COMPLETE METABOLIC PANEL WITH GFR  . Lipid panel  . VITAMIN D 25 Hydroxy (Vit-D Deficiency, Fractures)    Notify office for further evaluation and treatment, questions or concerns if any reported s/s fail to improve.   The patient was advised to call back or seek an in-person evaluation if any symptoms worsen or if the condition fails to improve as anticipated.   Further disposition pending results of labs. Discussed med's effects and SE's.    I discussed the assessment and treatment plan with the patient. The patient was provided an opportunity to ask questions and all were answered. The patient agreed with the plan and demonstrated an understanding of the instructions.  Discussed med's effects and SE's. Screening labs and tests as requested with regular follow-up as  recommended.  I provided 35 minutes of face-to-face time during this encounter including counseling, chart review, and critical decision making was preformed.  Today's Plan of Care is based on  a patient-centered health care approach known as shared decision making - the decisions, tests and treatments allow for patient preferences and values to be balanced with clinical evidence.     Future Appointments  Date Time Provider Department [name]  06/19/2023  2:30 PM Lucky CowboyMcKeown, William, MD [facility] None  09/22/2023  2:30 PM Adela Glimpseranford, Carlie Corpus, NP [facility] None  12/18/2023 11:00 AM Lucky CowboyMcKeown, William, MD [facility] None  03/17/2024  2:00 PM Adela Glimpseranford, [name], NP [facility] None     Plan:   During the course of the visit the patient was educated and counseled about appropriate screening and preventive services including:   Pneumococcal vaccine  Prevnar 13 Influenza vaccine Td vaccine Screening electrocardiogram Bone densitometry screening Colorectal cancer screening Diabetes screening Glaucoma screening Nutrition counseling  Advanced directives: requested    ----------------------------------------------------------------------------------------------------------------------  HPI 72 y.o. male  presents for 3MOV and AWV. He has Hypertension; Hyperlipidemia; GERD (gastroesophageal reflux disease); Vitamin D deficiency; Medication management; Insomnia; DDD (degenerative disc disease), lumbosacral; CKD (chronic kidney disease) stage 3, GFR 30-59 ml/min; ADD (attention deficit disorder); and Non-seasonal allergic rhinitis on their problem list.   Retired, sold place at [name], enjoys spending time with family and friends.   Has seasonal allergies, controls with OTC antihistamine, avoids triggers.  Has recent flare with constant clearing of throat, water eyes, rhinorrhea that is not responding to OTC antihistamine.   Chronic back pain history; He had out patient back surgery by Dr. Yetta [name] at  L3-4, L foraminectomy on 11/19/2019, has some mild residual sx, then in July had sudden worsening of pain. Now followed by pain management Dr. Vear [name]. Recent lumbar MRI on 07/18/2020 showed acute/early subacute L2 inferior endplate compression fracture with mild height loss. 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 Had L5 nerve root block, then on 10/19/2020 had hemilaminectomy in L4-5 with progressive improvement. He is prescribed oxycodone 5 mg by Dr. Vear [name], very infrequent, planning to stop taking.  Takes 2 extra strength tylenol daily.    Patient is on an ADD medication, he states that the medication is helping and he denies any adverse reactions. Takes adderall 10 mg 2-3 days/week for focus.   He has insomnia and as been prescribed ativan 0.5 mg ~5 days per week. Tried belsomra but with AM sedation.   He has a diagnosis of GERD which is currently managed by nexium 40 mg every other day.  He had failed attempts to Pepcid.  Symptoms are currently well controlled.   BMI is There is no height or weight on file to calculate BMI., he has been working on diet, exercise currently limited as recovering from back surgery.  Wt Readings from Last 3 Encounters:  12/16/22 150 lb 6.4 oz (68.2 kg)  08/20/22 149 lb (67.6 kg)  04/10/22 150 lb 6.4 oz (68.2 kg)   His blood pressure has been controlled at home, today their BP is    He does not workout. He denies chest pain, shortness of breath, dizziness.  He has CKD III followed by nephrology, [name],  Lab Results  Component Value Date   GFRNONAA 49 (L) 06/07/2021   GFRNONAA 51 (L) 03/01/2021   GFRNONAA 48 (L) 11/23/2020    He is on cholesterol medication and denies myalgias. His LDL cholesterol is at goal; triglycerides remain elevated. The cholesterol last visit was:   Lab Results  Component Value Date   CHOL 168 12/16/2022   HDL 70 12/16/2022   LDLCALC 68 12/16/2022   TRIG  238  (H) 12/16/2022   CHOLHDL 2.4 12/16/2022    He has been working on diet and exercise for glucose management, and denies foot ulcerations, nausea, polydipsia, polyuria, visual disturbances, vomiting and weight loss. Last A1C in the office was:  Lab Results  Component Value Date   HGBA1C 5.2 12/16/2022   Patient is on Vitamin D supplement taking 2000 IU for kidney protection.  Lab Results  Component Value Date   VD25OH 56 12/16/2022      Current Medications:   Current Outpatient Medications (Endocrine & Metabolic):  .  dexamethasone (DECADRON) 4 MG tablet, Take 1 tab 3 x day - 3 days, then 2 x day - 3 days, then 1 tab daily  Current Outpatient Medications (Cardiovascular):  .  atenolol (TENORMIN) 50 MG tablet, TAKE 1 TABLET BY MOUTH DAILY FOR BLOOD PRESSURE .  atorvastatin (LIPITOR) 40 MG tablet, TAKE 1 TABLET BY MOUTH DAILY FOR CHOLESTEROL .  losartan (COZAAR) 50 MG tablet, Take 50 mg by mouth daily.   Current Outpatient Medications (Analgesics):  .  acetaminophen (TYLENOL) 500 MG tablet, Take 500 mg by mouth every 6 (six) hours as needed.    Current Outpatient Medications (Other):  Marland Kitchen  Cholecalciferol (VITAMIN D PO), Take 2,000 Units by mouth daily. Marland Kitchen  esomeprazole (NEXIUM) 40 MG capsule, Take  1 capsule  Daily  to Prevent Heartburn & Indigestion .  finasteride (PROSCAR) 5 MG tablet, Take 1 tablet (5 mg total) by mouth daily. Marland Kitchen  LORazepam (ATIVAN) 1 MG tablet, Take   1/2 - 1 tablet    at Bedtime   ONLY   if needed for Sleep &  limit to 5 days /week to avoid Addiction & Dementia .  valACYclovir (VALTREX) 500 MG tablet, Take 1 tablet (500 mg total) by mouth 3 (three) times daily. TAKE 1 TABLET BY MOUTH THREE TIMES DAILY FOR FEVER BLISTERS AS Needed   Medical History:  Past Medical History:  Diagnosis Date  . Arthritis    right thumb  . 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 Dry eyes    Takes Restasis  . GERD (gastroesophageal reflux disease)   .  Hyperlipidemia   . Hypertension   . Vitamin D deficiency    Allergies Allergies  Allergen Reactions  . Nsaids     Renal Insufficiency    SURGICAL HISTORY He  has a past surgical history that includes Esophagogastroduodenoscopy (1990); Colonoscopy w/ polypectomy; Total hip arthroplasty (Left, 10/28/2014); and Back surgery (11/19/2019). FAMILY HISTORY His family history includes COPD in his father; Diabetes in his brother; Heart disease in his father; Osteoporosis in his mother; Schizophrenia in his mother. SOCIAL HISTORY He  reports that he has never smoked. He has never used smokeless tobacco. He reports current alcohol use. He reports that he does not use drugs.  Immunization History  Administered Date(s) Administered  . Influenza Split 09/22/2013  . Influenza, High Dose Seasonal PF 10/22/2019  . Influenza-Unspecified 09/08/2017, 08/19/2018, 09/24/2020  . PFIZER(Purple Top)SARS-COV-2 Vaccination 10/25/2020  . PPD Test 09/13/2014, 07/25/2015, 08/08/2016, 09/08/2017, 10/09/2018  . Pneumococcal Conjugate-13 01/22/2018, 10/22/2019  . Pneumococcal Polysaccharide-23 01/19/2009  . Td 07/22/2019  . Tdap 01/19/2009  . Zoster, Live 01/29/2013    Preventative care: Last colonoscopy: 07/2018, next due 2024, Dr. Ramon Dredge   Prior vaccinations: TD or Tdap: 2020  Influenza: 2022 Pneumococcal: 2010 Prevnar13: 2019 Shingles/Zostavax: 2014 suggest shingrix by age 36 Covid 57: 3/3, 2022  Names of Other Physician/Practitioners you currently use: 1. [facility] here for primary care 2. Dr. Sherrine Maples, eye doctor, last visit 2023,  3. Dr. Rondel Oh, dentist, last visit 2024  Patient Care Team: Lucky Cowboy, MD as PCP - General (Internal Medicine)  MEDICARE WELLNESS OBJECTIVES: Physical activity:   Cardiac risk factors:   Depression/mood screen:      03/18/2023    3:18 PM  Depression screen PHQ 2/9  Decreased Interest 0  Down, Depressed, Hopeless 0   PHQ - 2 Score 0    ADLs:     03/18/2023    3:01 PM 12/15/2022    9:29 PM  In your present state of health, do you have any difficulty performing the following activities:  Hearing? 0 0  Vision? 0 0  Difficulty concentrating or making decisions? 0 0  Walking or climbing stairs? 0 0  Dressing or bathing? 0 0  Doing errands, shopping? 0 0  Preparing Food and eating ? N   Using the Toilet? N   Do you have problems with loss of bowel control? N   Managing your Medications? N   Managing your Finances? N   Housekeeping or managing your Housekeeping? N      Cognitive Testing  Alert? Yes  Normal Appearance?Yes  Oriented to person? Yes  Place? Yes   Time? Yes  Recall of three objects?  Yes  Can perform simple calculations? Yes  Displays appropriate judgment?Yes  Can read the correct time from a watch face?Yes  EOL planning:     Review of Systems:  Review of Systems  Constitutional:  Negative for malaise/fatigue and weight loss.  HENT:  Negative for congestion, hearing loss, sore throat and tinnitus.   Eyes:  Negative for blurred vision and double vision.  Respiratory:  Negative for cough (frequent dry cough during the day), sputum production, shortness of breath and wheezing.   Cardiovascular:  Negative for chest pain, palpitations, orthopnea, claudication and leg swelling.  Gastrointestinal:  Negative for abdominal pain, blood in stool, constipation, diarrhea, heartburn, melena, nausea and vomiting.  Genitourinary: Negative.   Musculoskeletal:  Positive for back pain. Negative for joint pain and myalgias.  Skin:  Negative for rash.  Neurological:  Positive for focal weakness. Negative for dizziness, tingling, sensory change, weakness and headaches.  Endo/Heme/Allergies:  Positive for environmental allergies. Negative for polydipsia.  Psychiatric/Behavioral: Negative.    All other systems reviewed and are negative.   Physical Exam: There were no vitals taken for this  visit. Wt Readings from Last 3 Encounters:  12/16/22 150 lb 6.4 oz (68.2 kg)  08/20/22 149 lb (67.6 kg)  04/10/22 150 lb 6.4 oz (68.2 kg)   General Appearance: Well nourished, in no apparent distress. Eyes: PERRLA, EOMs, conjunctiva no swelling or erythema Sinuses: No Frontal/maxillary tenderness ENT/Mouth: Ext aud canals clear, TMs without erythema, bulging. No erythema, swelling, or exudate on post pharynx.  Tonsils not swollen or erythematous. Hearing normal. Frequent dry brief cough/throat clearing.  Neck: Supple, thyroid normal.  Respiratory: Respiratory effort normal, BS equal bilaterally without rales, rhonchi, wheezing or stridor. Frequent dry cough. Cardio: RRR with no MRGs. Brisk peripheral pulses without edema.  Abdomen: Soft, + BS.  Non tender, no guarding, rebound, hernias, masses. Lymphatics: Non tender without lymphadenopathy.  Musculoskeletal: Full ROM (pain with ROM in lumbar), 5/5 strength, Normal gait, well healed lumbar surgical scar.  Skin: Warm, dry without rashes, lesions, ecchymosis.  Neuro: Cranial nerves intact. No cerebellar symptoms.  Psych: Awake and oriented X 3, normal affect, Insight and Judgment appropriate.  Medicare Attestation I have personally reviewed: The patient's medical and social history Their use of alcohol, tobacco or illicit drugs Their current medications and supplements The patient's functional ability including ADLs,fall risks, home safety risks, cognitive, and hearing and visual impairment Diet and physical activities Evidence for depression or mood disorders  The patient's weight, height, BMI, and visual acuity have been recorded in the chart.  I have made referrals, counseling, and provided education to the patient based on review of the above and I have provided the patient with a written personalized care plan for preventive services.     Adela Glimpse, NP 3:19 PM  [facility]

## 2023-03-19 LAB — CBC WITH DIFFERENTIAL/PLATELET
Absolute Monocytes: 884 cells/uL (ref 200–950)
Basophils Absolute: 43 cells/uL (ref 0–200)
Basophils Relative: 0.5 %
Eosinophils Absolute: 43 cells/uL (ref 15–500)
Eosinophils Relative: 0.5 %
HCT: 47.5 % (ref 38.5–50.0)
Hemoglobin: 16.4 g/dL (ref 13.2–17.1)
Lymphs Abs: 1377 cells/uL (ref 850–3900)
MCH: 33.9 pg — ABNORMAL HIGH (ref 27.0–33.0)
MCHC: 34.5 g/dL (ref 32.0–36.0)
MCV: 98.1 fL (ref 80.0–100.0)
MPV: 10.3 fL (ref 7.5–12.5)
Monocytes Relative: 10.4 %
Neutro Abs: 6154 cells/uL (ref 1500–7800)
Neutrophils Relative %: 72.4 %
Platelets: 205 10*3/uL (ref 140–400)
RBC: 4.84 10*6/uL (ref 4.20–5.80)
RDW: 11.8 % (ref 11.0–15.0)
Total Lymphocyte: 16.2 %
WBC: 8.5 10*3/uL (ref 3.8–10.8)

## 2023-03-19 LAB — COMPLETE METABOLIC PANEL WITH GFR
AG Ratio: 2.2 (calc) (ref 1.0–2.5)
ALT: 36 U/L (ref 9–46)
AST: 29 U/L (ref 10–35)
Albumin: 4.3 g/dL (ref 3.6–5.1)
Alkaline phosphatase (APISO): 68 U/L (ref 35–144)
BUN: 21 mg/dL (ref 7–25)
CO2: 28 mmol/L (ref 20–32)
Calcium: 9.4 mg/dL (ref 8.6–10.3)
Chloride: 102 mmol/L (ref 98–110)
Creat: 1.18 mg/dL (ref 0.70–1.28)
Globulin: 2 g/dL (calc) (ref 1.9–3.7)
Glucose, Bld: 89 mg/dL (ref 65–99)
Potassium: 4.5 mmol/L (ref 3.5–5.3)
Sodium: 140 mmol/L (ref 135–146)
Total Bilirubin: 0.8 mg/dL (ref 0.2–1.2)
Total Protein: 6.3 g/dL (ref 6.1–8.1)
eGFR: 66 mL/min/{1.73_m2} (ref >=60)

## 2023-03-19 LAB — LIPID PANEL
Cholesterol: 173 mg/dL
HDL: 69 mg/dL
LDL Cholesterol (Calc): 83 mg/dL
Non-HDL Cholesterol (Calc): 104 mg/dL
Total CHOL/HDL Ratio: 2.5 (calc)
Triglycerides: 116 mg/dL

## 2023-03-19 LAB — VITAMIN D 25 HYDROXY (VIT D DEFICIENCY, FRACTURES): Vit D, 25-Hydroxy: 64 ng/mL (ref 30–100)

## 2023-03-19 MED ORDER — LORAZEPAM 1 MG PO TABS: ORAL_TABLET | ORAL | 0 refills | Status: DC

## 2023-03-19 MED ORDER — LOSARTAN POTASSIUM 50 MG PO TABS: 50.0000 mg | ORAL_TABLET | Freq: Every day | ORAL | 1 refills | Status: DC

## 2023-03-19 MED ORDER — VALACYCLOVIR HCL 500 MG PO TABS: 500.0000 mg | ORAL_TABLET | Freq: Three times a day (TID) | ORAL | 0 refills | Status: DC

## 2023-03-19 NOTE — Patient Instructions (Signed)

## 2023-04-14 ENCOUNTER — Encounter: Payer: Self-pay | Admitting: Nurse Practitioner

## 2023-04-14 DIAGNOSIS — I10 Essential (primary) hypertension: Secondary | ICD-10-CM

## 2023-04-14 MED ORDER — ATENOLOL 50 MG PO TABS: ORAL_TABLET | ORAL | 3 refills | Status: AC

## 2023-05-19 DIAGNOSIS — S32009S Unspecified fracture of unspecified lumbar vertebra, sequela: Secondary | ICD-10-CM | POA: Diagnosis not present

## 2023-05-19 DIAGNOSIS — M5416 Radiculopathy, lumbar region: Secondary | ICD-10-CM | POA: Diagnosis not present

## 2023-05-19 DIAGNOSIS — M47817 Spondylosis without myelopathy or radiculopathy, lumbosacral region: Secondary | ICD-10-CM | POA: Diagnosis not present

## 2023-05-19 DIAGNOSIS — M4125 Other idiopathic scoliosis, thoracolumbar region: Secondary | ICD-10-CM | POA: Diagnosis not present

## 2023-06-19 ENCOUNTER — Encounter: Payer: Self-pay | Admitting: Internal Medicine

## 2023-06-19 ENCOUNTER — Other Ambulatory Visit: Payer: Self-pay | Admitting: Internal Medicine

## 2023-06-19 ENCOUNTER — Ambulatory Visit (INDEPENDENT_AMBULATORY_CARE_PROVIDER_SITE_OTHER): Payer: PPO | Admitting: Internal Medicine

## 2023-06-19 VITALS — BP 130/70 | HR 65 | Temp 97.9°F | Resp 16 | Ht 67.0 in | Wt 152.6 lb

## 2023-06-19 DIAGNOSIS — Z86010 Personal history of colonic polyps: Secondary | ICD-10-CM

## 2023-06-19 DIAGNOSIS — M1000 Idiopathic gout, unspecified site: Secondary | ICD-10-CM | POA: Diagnosis not present

## 2023-06-19 DIAGNOSIS — Z79899 Other long term (current) drug therapy: Secondary | ICD-10-CM

## 2023-06-19 DIAGNOSIS — E559 Vitamin D deficiency, unspecified: Secondary | ICD-10-CM

## 2023-06-19 DIAGNOSIS — R7309 Other abnormal glucose: Secondary | ICD-10-CM | POA: Diagnosis not present

## 2023-06-19 DIAGNOSIS — F5101 Primary insomnia: Secondary | ICD-10-CM

## 2023-06-19 DIAGNOSIS — I10 Essential (primary) hypertension: Secondary | ICD-10-CM

## 2023-06-19 DIAGNOSIS — E782 Mixed hyperlipidemia: Secondary | ICD-10-CM | POA: Diagnosis not present

## 2023-06-19 DIAGNOSIS — M549 Dorsalgia, unspecified: Secondary | ICD-10-CM | POA: Diagnosis not present

## 2023-06-19 DIAGNOSIS — Z1211 Encounter for screening for malignant neoplasm of colon: Secondary | ICD-10-CM

## 2023-06-19 DIAGNOSIS — N1832 Chronic kidney disease, stage 3b: Secondary | ICD-10-CM | POA: Diagnosis not present

## 2023-06-19 MED ORDER — TRAZODONE HCL 150 MG PO TABS
ORAL_TABLET | ORAL | 0 refills | Status: DC
Start: 2023-06-19 — End: 2023-07-10

## 2023-06-19 MED ORDER — DEXAMETHASONE 4 MG PO TABS
ORAL_TABLET | ORAL | 0 refills | Status: DC
Start: 2023-06-19 — End: 2023-12-18

## 2023-06-19 NOTE — Patient Instructions (Signed)

## 2023-06-19 NOTE — Progress Notes (Unsigned)
Future Appointments  Date Time Provider Department  06/19/2023                        6 mo   2:30 PM Lucky Cowboy, MD [facility]  09/22/2023                       9 mo ov=  2:30 PM Adela Glimpse, NP [facility]  12/18/2023                          cpe 11:00 AM Lucky Cowboy, MD [facility]  03/17/2024                          wellness  2:00 PM Adela Glimpse, NP [facility]    History of Present Illness:       This very nice 71 y.o.male presents for 3 month follow up with HTN, HLD, Pre-Diabetes and Vitamin D Deficiency.         Patient is treated for HTN  since  & BP has been controlled at home. Today's  . Patient has had no complaints of any cardiac type chest pain, palpitations, dyspnea / orthopnea / PND, dizziness, claudication, or dependent edema.        Hyperlipidemia is controlled with diet & meds. Patient denies myalgias or other med SE's. 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  Last A1c was   Lab Results  Component Value Date   HGBA1C 5.2 12/16/2022         Further, the patient also has history of Vitamin D Deficiency and supplements vitamin D . Last vitamin D was  Lab Results  Component Value Date   VD25OH 64 03/18/2023      Current Outpatient Medications on File Prior to Visit  Medication Sig   acetaminophen (TYLENOL) 500 MG tablet Take 500 mg by mouth every 6 (six) hours as needed.    atenolol (TENORMIN) 50 MG tablet TAKE 1 TABLET BY MOUTH DAILY FOR BLOOD PRESSURE   atorvastatin (LIPITOR) 40 MG tablet TAKE 1 TABLET BY MOUTH DAILY FOR CHOLESTEROL   Cholecalciferol (VITAMIN D PO) Take 2,000 Units by mouth daily.   esomeprazole (NEXIUM) 40 MG capsule Take  1 capsule   Daily  to Prevent Heartburn & Indigestion   finasteride (PROSCAR) 5 MG tablet Take 1 tablet (5 mg total) by mouth daily.   LORazepam (ATIVAN) 1 MG tablet Take 1/2 - 1 tablet at Bedtime ONLY if needed for Sleep &  limit to 5 days /week to avoid Addiction & Dementia   losartan (COZAAR) 50 MG tablet Take 1 tablet (50 mg total) by mouth daily.   valACYclovir (VALTREX) 500 MG tablet Take 1 tablet (500 mg total) by mouth 3 (three) times daily. TAKE 1 TABLET BY MOUTH THREE TIMES DAILY FOR FEVER BLISTERS AS Needed   No current facility-administered medications on file prior to visit.      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   Dry eyes    Takes Restasis   GERD (gastroesophageal reflux disease)    Hyperlipidemia    Hypertension    Vitamin D deficiency       Immunization History  Administered Date(s) Administered   Influenza Split 09/22/2013   Influenza, High Dose Seasonal PF 10/22/2019   Influenza-Unspecified 09/08/2017, 08/19/2018, 09/24/2020   PFIZER(Purple Top)SARS-COV-2 Vaccination 10/25/2020   PPD Test 09/13/2014, 07/25/2015, 08/08/2016, 09/08/2017, 10/09/2018   Pneumococcal Conjugate-13 01/22/2018, 10/22/2019   Pneumococcal Polysaccharide-23 01/19/2009   Td 07/22/2019   Tdap 01/19/2009   Zoster, Live 01/29/2013      Past Surgical History:  Procedure Laterality Date   BACK SURGERY  11/19/2019   L3-4 R foraminectomy, Dr. [name]   COLONOSCOPY W/ POLYPECTOMY     ESOPHAGOGASTRODUODENOSCOPY  1990   TOTAL HIP ARTHROPLASTY Left 10/28/2014   Procedure: TOTAL HIP ARTHROPLASTY ANTERIOR APPROACH;  Surgeon: Eldred Manges, MD;  Location: [facility] OR;  Service: Orthopedics;  Laterality: Left;     FHx:    Reviewed / unchanged   SHx:    Reviewed / unchanged    Systems Review:  Constitutional: Denies fever, chills,  wt changes, headaches, insomnia, fatigue, night sweats, change in appetite. Eyes: Denies redness, blurred vision, diplopia, discharge, itchy, watery eyes.  ENT: Denies discharge, congestion, post nasal drip, epistaxis, sore throat, earache, hearing loss, dental pain, tinnitus, vertigo, sinus pain, snoring.  CV: Denies chest pain, palpitations, irregular heartbeat, syncope, dyspnea, diaphoresis, orthopnea, PND, claudication or edema. Respiratory: denies cough, dyspnea, DOE, pleurisy, hoarseness, laryngitis, wheezing.  Gastrointestinal: Denies dysphagia, odynophagia, heartburn, reflux, water brash, abdominal pain or cramps, nausea, vomiting, bloating, diarrhea, constipation, hematemesis, melena, hematochezia  or hemorrhoids. Genitourinary: Denies dysuria, frequency, urgency, nocturia, hesitancy, discharge, hematuria or flank pain. Musculoskeletal: Denies arthralgias, myalgias, stiffness, jt. swelling, pain, limping or strain/sprain.  Skin: Denies pruritus, rash, hives, warts, acne, eczema or change in skin lesion(s). Neuro: No weakness, tremor, incoordination, spasms, paresthesia or pain. Psychiatric: Denies confusion, memory loss or sensory loss. Endo: Denies change in weight, skin or hair change.  Heme/Lymph: No excessive bleeding, bruising or enlarged lymph nodes.   Physical Exam  There were no vitals taken for this visit.  Appears  well nourished, well groomed  and in no distress.  Eyes: PERRLA, EOMs, conjunctiva no swelling or erythema. Sinuses: No frontal/maxillary tenderness ENT/Mouth: EAC's clear, TM's nl w/o erythema, bulging. Nares clear w/o erythema, swelling, exudates. Oropharynx clear without erythema or exudates. Oral hygiene is good. Tongue normal, non obstructing. Hearing intact.  Neck: Supple. Thyroid not palpable. Car 2+/2+ without bruits, nodes or JVD. Chest: Respirations nl with BS clear & equal w/o rales, rhonchi, wheezing or stridor.  Cor: Heart sounds normal w/ regular  rate and rhythm without sig. murmurs, gallops, clicks or rubs. Peripheral pulses normal and equal  without edema.  Abdomen: Soft & bowel sounds normal. Non-tender w/o guarding, rebound, hernias, masses or organomegaly.  Lymphatics: Unremarkable.  Musculoskeletal: Full ROM all peripheral extremities, joint stability, 5/5 strength and normal gait.  Skin: Warm, dry without exposed rashes, lesions or ecchymosis apparent.  Neuro: Cranial nerves intact, reflexes equal bilaterally. Sensory-motor testing grossly intact. Tendon reflexes grossly intact.  Pysch: Alert & oriented x 3.  Insight and judgement nl & appropriate. No ideations.   Assessment and Plan:  - Continue medication, monitor blood pressure at home.  - Continue DASH diet.  Reminder to go to  the ER if any CP,  SOB, nausea, dizziness, severe HA, changes vision/speech.  - Continue diet/meds, exercise,& lifestyle modifications.  - Continue monitor periodic cholesterol/liver & renal functions    - Continue diet, exercise  - Lifestyle modifications.  - Monitor appropriate labs. - Continue supplementation.        Discussed  regular exercise, BP monitoring, weight control to achieve/maintain BMI less than 25 and discussed med and SE's. Recommended labs to assess /monitor clinical status .  I discussed the assessment and treatment plan with the patient. The patient was provided an opportunity to ask questions and all were answered. The patient agreed with the plan and demonstrated an understanding of the instructions.  I provided over 30 minutes of exam, counseling, chart review and  complex critical decision making.        The patient was advised to call back or seek an in-person evaluation if the symptoms worsen or if the condition fails to improve as anticipated.   Marinus Maw, MD

## 2023-06-20 LAB — TSH: TSH: 1.05 mIU/L (ref 0.40–4.50)

## 2023-06-20 LAB — COMPLETE METABOLIC PANEL WITH GFR
AG Ratio: 2 (calc) (ref 1.0–2.5)
ALT: 36 U/L (ref 9–46)
AST: 29 U/L (ref 10–35)
Albumin: 4.2 g/dL (ref 3.6–5.1)
Alkaline phosphatase (APISO): 77 U/L (ref 35–144)
BUN: 22 mg/dL (ref 7–25)
CO2: 26 mmol/L (ref 20–32)
Calcium: 9.3 mg/dL (ref 8.6–10.3)
Chloride: 97 mmol/L — ABNORMAL LOW (ref 98–110)
Creat: 1.13 mg/dL (ref 0.70–1.28)
Globulin: 2.1 g/dL (calc) (ref 1.9–3.7)
Glucose, Bld: 87 mg/dL (ref 65–99)
Potassium: 4.4 mmol/L (ref 3.5–5.3)
Sodium: 134 mmol/L — ABNORMAL LOW (ref 135–146)
Total Bilirubin: 1 mg/dL (ref 0.2–1.2)
Total Protein: 6.3 g/dL (ref 6.1–8.1)
eGFR: 70 mL/min/{1.73_m2} (ref >=60)

## 2023-06-20 LAB — VITAMIN D 25 HYDROXY (VIT D DEFICIENCY, FRACTURES): Vit D, 25-Hydroxy: 63 ng/mL (ref 30–100)

## 2023-06-20 LAB — CBC WITH DIFFERENTIAL/PLATELET
Absolute Monocytes: 1034 cells/uL — ABNORMAL HIGH (ref 200–950)
Basophils Absolute: 68 cells/uL (ref 0–200)
Basophils Relative: 0.9 %
Eosinophils Absolute: 68 cells/uL (ref 15–500)
Eosinophils Relative: 0.9 %
HCT: 47.4 % (ref 38.5–50.0)
Hemoglobin: 16.5 g/dL (ref 13.2–17.1)
Lymphs Abs: 1307 cells/uL (ref 850–3900)
MCH: 34.4 pg — ABNORMAL HIGH (ref 27.0–33.0)
MCHC: 34.8 g/dL (ref 32.0–36.0)
MCV: 98.8 fL (ref 80.0–100.0)
MPV: 10.1 fL (ref 7.5–12.5)
Monocytes Relative: 13.6 %
Neutro Abs: 5122 cells/uL (ref 1500–7800)
Neutrophils Relative %: 67.4 %
Platelets: 203 10*3/uL (ref 140–400)
RBC: 4.8 10*6/uL (ref 4.20–5.80)
RDW: 12.1 % (ref 11.0–15.0)
Total Lymphocyte: 17.2 %
WBC: 7.6 10*3/uL (ref 3.8–10.8)

## 2023-06-20 LAB — MAGNESIUM: Magnesium: 2 mg/dL (ref 1.5–2.5)

## 2023-06-20 LAB — URIC ACID: Uric Acid, Serum: 5.9 mg/dL (ref 4.0–8.0)

## 2023-06-20 LAB — INSULIN, RANDOM: Insulin: 12.4 u[IU]/mL

## 2023-06-20 LAB — LIPID PANEL
Cholesterol: 162 mg/dL (ref <200)
HDL: 56 mg/dL (ref >=40)
LDL Cholesterol (Calc): 74 mg/dL (calc)
Non-HDL Cholesterol (Calc): 106 mg/dL (calc) (ref <130)
Total CHOL/HDL Ratio: 2.9 (calc) (ref <5.0)
Triglycerides: 231 mg/dL — ABNORMAL HIGH (ref <150)

## 2023-06-20 LAB — HEMOGLOBIN A1C W/OUT EAG: Hgb A1c MFr Bld: 5.2 % of total Hgb (ref <5.7)

## 2023-06-21 NOTE — Progress Notes (Signed)
^<^<^<^<^<^<^<^<^<^<^<^<^<^<^<^<^<^<^<^<^<^<^<^<^<^<^<^<^<^<^<^<^<^<^<^<^ ^>^>^>^>^>^>^>^>^>^>^>>^>^>^>^>^>^>^>^>^>^>^>^>^>^>^>^>^>^>^>^>^>^>^>^>^>  -  Test results slightly outside the reference range are not unusual. If there is anything important, I will review this with you,  otherwise it is considered normal test values.  If you have further questions,  please do not hesitate to contact me at the office or via My Chart.   ^<^<^<^<^<^<^<^<^<^<^<^<^<^<^<^<^<^<^<^<^<^<^<^<^<^<^<^<^<^<^<^<^<^<^<^<^ ^>^>^>^>^>^>^>^>^>^>^>^>^>^>^>^>^>^>^>^>^>^>^>^>^>^>^>^>^>^>^>^>^>^>^>^>^  -  Chol = 162  & LDL = 74    -   Both  Excellent   - Very low risk for Heart Attack  / Stroke  ^>^>^>^>^>^>^>^>^>^>^>^>^>^>^>^>^>^>^>^>^>^>^>^>^>^>^>^>^>^>^>^>^>^>^>^>^ ^>^>^>^>^>^>^>^>^>^>^>^>^>^>^>^>^>^>^>^>^>^>^>^>^>^>^>^>^>^>^>^>^>^>^>^>^  -  But Triglycerides (  =  231   ) or fats in blood are too high                 (   Ideal or  Goal is less than 150  !  )    - Recommend avoid fried & greasy foods,  sweets / candy,   - Avoid white rice  (brown or wild rice or Quinoa is OK),   - Avoid white potatoes  (sweet potatoes are OK)   - Avoid anything made from white flour  - bagels, doughnuts, rolls, buns, biscuits, white and   wheat breads, pizza crust and traditional  pasta made of white flour & egg white  - (vegetarian pasta or spinach or wheat pasta is OK).    - Multi-grain bread is OK - like multi-grain flat bread or  sandwich thins.   - Avoid alcohol in excess.   - Exercise is also important.  ^>^>^>^>^>^>^>^>^>^>^>^>^>^>^>^>^>^>^>^>^>^>^>^>^>^>^>^>^>^>^>^>^>^>^>^>^  - A1c - normal - No Diabetes  - Great  !   ^>^>^>^>^>^>^>^>^>^>^>^>^>^>^>^>^>^>^>^>^>^>^>^>^>^>^>^>^>^>^>^>^>^>^>^>^  -  Vitamin D = 32  - Great  - Please keep dose same  ^>^>^>^>^>^>^>^>^>^>^>^>^>^>^>^>^>^>^>^>^>^>^>^>^>^>^>^>^>^>^>^>^>^>^>^>^  - Uric Acid / Gout test - Normal    ^>^>^>^>^>^>^>^>^>^>^>^>^>^>^>^>^>^>^>^>^>^>^>^>^>^>^>^>^>^>^>^>^>^>^>^>^  - All Else - CBC - Kidneys - Electrolytes - Liver - Magnesium & Thyroid    - all  Normal / OK  ^>^>^>^>^>^>^>^>^>^>^>^>^>^>^>^>^>^>^>^>^>^>^>^>^>^>^>^>^>^>^>^>^>^>^>^>^ ^>^>^>^>^>^>^>^>^>^>^>^>^>^>^>^>^>^>^>^>^>^>^>^>^>^>^>^>^>^>^>^>^>^>^>^>^

## 2023-06-27 ENCOUNTER — Telehealth: Payer: Self-pay | Admitting: Internal Medicine

## 2023-06-27 NOTE — Telephone Encounter (Signed)
Hi Dr. Leone Payor,     We received a referral for patient for a colonoscopy. He is requesting to transfer his care specifically over to you. Would like to be seen only for a colonoscopy due to being due every 5 year with a history of polyps.Currently is not experiencing any symptoms. Patient last had a colonoscopy with [facility] in 2019. Requesting to transfer his care due his primary care provider referring him to you. Patient's previous records are EPIC for you to review and advise on scheduling.     Thank you.

## 2023-06-29 NOTE — Telephone Encounter (Signed)
I have reviewed available information  I know that he has had at least one polyp but do not know the details and I need that info   Please explain that guidelines have changed since last colonoscopy in 2019 by Dr. Randa Evens  and that the patient may not need a repeat colonoscopy yet  He needs to obtain all of his colonoscopy reports and pathology reports so that I can review and make a decision

## 2023-06-30 NOTE — Telephone Encounter (Signed)
Patient will have all previous records sent over.

## 2023-07-10 ENCOUNTER — Other Ambulatory Visit: Payer: Self-pay | Admitting: Internal Medicine

## 2023-07-10 ENCOUNTER — Encounter: Payer: Self-pay | Admitting: Internal Medicine

## 2023-07-10 DIAGNOSIS — F5101 Primary insomnia: Secondary | ICD-10-CM

## 2023-07-10 MED ORDER — HYDROXYZINE HCL 50 MG PO TABS
ORAL_TABLET | ORAL | 0 refills | Status: AC
Start: 2023-07-10 — End: ?

## 2023-08-01 NOTE — Telephone Encounter (Signed)
OK to schedule direct colonoscopy  Hx polyps

## 2023-08-01 NOTE — Telephone Encounter (Signed)
Records from [facility] were received and scanned into Media.

## 2023-08-04 ENCOUNTER — Encounter: Payer: Self-pay | Admitting: Internal Medicine

## 2023-08-14 DIAGNOSIS — G5622 Lesion of ulnar nerve, left upper limb: Secondary | ICD-10-CM | POA: Diagnosis not present

## 2023-08-19 DIAGNOSIS — G5622 Lesion of ulnar nerve, left upper limb: Secondary | ICD-10-CM | POA: Diagnosis not present

## 2023-08-25 DIAGNOSIS — G5622 Lesion of ulnar nerve, left upper limb: Secondary | ICD-10-CM | POA: Diagnosis not present

## 2023-09-09 ENCOUNTER — Ambulatory Visit (AMBULATORY_SURGERY_CENTER): Payer: PPO

## 2023-09-09 VITALS — Ht 67.0 in | Wt 150.0 lb

## 2023-09-09 DIAGNOSIS — Z860100 Personal history of colon polyps, unspecified: Secondary | ICD-10-CM

## 2023-09-09 NOTE — Progress Notes (Signed)
No egg or soy allergy known to patient  No issues known to pt with past sedation with any surgeries or procedures Patient denies ever being told they had issues or difficulty with intubation  No FH of Malignant Hyperthermia Pt is not on diet pills Pt is not on  home 02  Pt is not on blood thinners  Pt denies issues with constipation  No A fib or A flutter Have any cardiac testing pending--no  LOA: independent  Prep: spilt dose miralax   Patient's chart reviewed by Cathlyn Parsons CNRA prior to previsit and patient appropriate for the [facility].  Previsit completed and red dot placed by patient's name on their procedure day (on provider's schedule).     PV competed with patient. Prep instructions sent via mychart and home address.

## 2023-09-14 ENCOUNTER — Other Ambulatory Visit: Payer: Self-pay | Admitting: Nurse Practitioner

## 2023-09-15 DIAGNOSIS — M47817 Spondylosis without myelopathy or radiculopathy, lumbosacral region: Secondary | ICD-10-CM | POA: Diagnosis not present

## 2023-09-15 DIAGNOSIS — M5416 Radiculopathy, lumbar region: Secondary | ICD-10-CM | POA: Diagnosis not present

## 2023-09-15 DIAGNOSIS — M4125 Other idiopathic scoliosis, thoracolumbar region: Secondary | ICD-10-CM | POA: Diagnosis not present

## 2023-09-15 DIAGNOSIS — Z79891 Long term (current) use of opiate analgesic: Secondary | ICD-10-CM | POA: Diagnosis not present

## 2023-09-15 DIAGNOSIS — G894 Chronic pain syndrome: Secondary | ICD-10-CM | POA: Diagnosis not present

## 2023-09-15 DIAGNOSIS — S32009S Unspecified fracture of unspecified lumbar vertebra, sequela: Secondary | ICD-10-CM | POA: Diagnosis not present

## 2023-09-18 ENCOUNTER — Encounter: Payer: Self-pay | Admitting: Internal Medicine

## 2023-09-22 ENCOUNTER — Ambulatory Visit: Payer: PPO | Admitting: Nurse Practitioner

## 2023-09-24 ENCOUNTER — Encounter: Payer: Self-pay | Admitting: Nurse Practitioner

## 2023-09-24 ENCOUNTER — Ambulatory Visit (INDEPENDENT_AMBULATORY_CARE_PROVIDER_SITE_OTHER): Payer: PPO | Admitting: Nurse Practitioner

## 2023-09-24 VITALS — BP 134/80 | HR 60 | Temp 97.4°F | Ht 67.0 in | Wt 152.4 lb

## 2023-09-24 DIAGNOSIS — F902 Attention-deficit hyperactivity disorder, combined type: Secondary | ICD-10-CM | POA: Diagnosis not present

## 2023-09-24 DIAGNOSIS — E782 Mixed hyperlipidemia: Secondary | ICD-10-CM | POA: Diagnosis not present

## 2023-09-24 DIAGNOSIS — J3089 Other allergic rhinitis: Secondary | ICD-10-CM

## 2023-09-24 DIAGNOSIS — N1832 Chronic kidney disease, stage 3b: Secondary | ICD-10-CM | POA: Diagnosis not present

## 2023-09-24 DIAGNOSIS — Z23 Encounter for immunization: Secondary | ICD-10-CM | POA: Diagnosis not present

## 2023-09-24 DIAGNOSIS — N138 Other obstructive and reflux uropathy: Secondary | ICD-10-CM

## 2023-09-24 DIAGNOSIS — Z79899 Other long term (current) drug therapy: Secondary | ICD-10-CM | POA: Diagnosis not present

## 2023-09-24 DIAGNOSIS — M51371 Other intervertebral disc degeneration, lumbosacral region with lower extremity pain only: Secondary | ICD-10-CM | POA: Diagnosis not present

## 2023-09-24 DIAGNOSIS — N401 Enlarged prostate with lower urinary tract symptoms: Secondary | ICD-10-CM | POA: Diagnosis not present

## 2023-09-24 DIAGNOSIS — M1000 Idiopathic gout, unspecified site: Secondary | ICD-10-CM

## 2023-09-24 DIAGNOSIS — K219 Gastro-esophageal reflux disease without esophagitis: Secondary | ICD-10-CM

## 2023-09-24 DIAGNOSIS — E559 Vitamin D deficiency, unspecified: Secondary | ICD-10-CM | POA: Diagnosis not present

## 2023-09-24 DIAGNOSIS — I10 Essential (primary) hypertension: Secondary | ICD-10-CM

## 2023-09-24 DIAGNOSIS — G4709 Other insomnia: Secondary | ICD-10-CM

## 2023-09-24 DIAGNOSIS — B001 Herpesviral vesicular dermatitis: Secondary | ICD-10-CM

## 2023-09-24 MED ORDER — VALACYCLOVIR HCL 500 MG PO TABS: 500.0000 mg | ORAL_TABLET | Freq: Three times a day (TID) | ORAL | 0 refills | Status: DC

## 2023-09-24 NOTE — Progress Notes (Signed)
FOLLOW UP  [name] was seen today for a follow up  Diagnostics and Plan of care are noted below:  HTN Above goal in clinic Continue Atenolol, Losartan Discussed DASH (Dietary Approaches to Stop Hypertension) DASH diet is lower in sodium than a typical American diet. Cut back on foods that are high in saturated fat, cholesterol, and trans fats. Eat more whole-grain foods, fish, poultry, and nuts Remain active and exercise as tolerated daily.  Monitor BP at home-Call if consistently greater than 130/80.  Check CMP/CBC  Non-seasonal allergic rhinitis, unspecified trigger Continue antihistamine Sample Norel AD provided Hygiene reviewed; avoid trigger  Gastroesophageal reflux disease, unspecified whether esophagitis present/Screening for colon cancer Increased symptoms - Continue Nexium 40 mg daily Suggest if s/s fail to improve, obtain EGD when colonoscopy due 07/2023 No suspected reflux complications (Barret/stricture). Lifestyle modification:  wt loss, avoid meals 2-3h before bedtime. Consider eliminating food triggers:  chocolate, caffeine, EtOH, acid/spicy food.  DDD (degenerative disc disease), lumbosacral Continue follow up neurosurgery/Dr. Phillips/ pain management as needed  Stage 3b chronic kidney disease (HCC) Discussed how what you eat and drink can aide in kidney protection. Stay well hydrated. Avoid high salt foods. Avoid NSAIDS. Keep BP and BG well controlled.   Take medications as prescribed. Remain active and exercise as tolerated daily. Maintain weight.  Continue to monitor. Check CMP/GFR/Microablumin  Hyperlipidemia, unspecified hyperlipidemia type Discussed lifestyle modifications. Recommended diet heavy in fruits and veggies, omega 3's. Decrease consumption of animal meats, cheeses, and dairy products. Remain active and exercise as tolerated. Continue to monitor. Check lipids/TSH  Vitamin D deficiency Continue supplement for goal of  60-100 Monitor Vitamin D levels  Insomnia, unspecified type Discussed good sleep hygiene. Establish bed and wake times. Sleep restriction-only sleep estimated hrs sleep. Bed only for sex and sleep, only sleep when sleepy, out of bed if anxious (stimulus control). Reviewed relaxation techniques, mindful meditations. Expected sleep duration. Addressed worries about not sleeping.   Attention deficit disorder, unspecified hyperactivity presence Medications providing over 30% improvement of attention deficit hyperactivity disorder. No change in management. Using lowest effective dose. Discussed alternative pharmacological and non-pharmacological therapies. No suspected aberrant drug-taking behaviors. Continue to monitor  Medication management All medications discussed All questions and concerns addressed.  Gout  No recent flare - controlled Discussed low purine diet  BPH Controlled Continue Proscar Continue to monitor  Flu vaccine need Administered - patient tolerated well  Orders Placed This Encounter  Procedures   Flu vaccine HIGH DOSE PF   CBC with Differential/Platelet   COMPLETE METABOLIC PANEL WITH GFR   Lipid panel     Notify office for further evaluation and treatment, questions or concerns if any reported s/s fail to improve.   The patient was advised to call back or seek an in-person evaluation if any symptoms worsen or if the condition fails to improve as anticipated.   Further disposition pending results of labs. Discussed med's effects and SE's.    I discussed the assessment and treatment plan with the patient. The patient was provided an opportunity to ask questions and all were answered. The patient agreed with the plan and demonstrated an understanding of the instructions.  Discussed med's effects and SE's. Screening labs and tests as requested with regular follow-up as recommended.  I provided 35 minutes of face-to-face time during this encounter  including counseling, chart review, and critical decision making was preformed.  Today's Plan of Care is based on a patient-centered health care approach known as shared decision making -  the decisions, tests and treatments allow for patient preferences and values to be balanced with clinical evidence.     Future Appointments  Date Time Provider Department Center  10/03/2023 10:30 AM [name] Boop, MD [facility]  12/18/2023 11:00 AM [name] Cowboy, MD [facility] None  03/17/2024  2:00 PM [name] Glimpse, NP [facility] None   --------------------------------------------------------------------------------------------------------  HPI 72 y.o. male  presents for . He has Hypertension; Hyperlipidemia; GERD (gastroesophageal reflux disease); Vitamin D deficiency; Medication management; Insomnia; DDD (degenerative disc disease), lumbosacral; CKD (chronic kidney disease) stage 3, GFR 30-59 ml/min (HCC); ADD (attention deficit disorder); and Non-seasonal allergic rhinitis on their problem list.  Overall he reports feeling well today.  He has no new concerns at this time.    Has seasonal allergies, controls with OTC antihistamine, avoids triggers.  Has recent flare with constant clearing of throat, water eyes, rhinorrhea that is not responding to OTC antihistamine. Is willing to switch up antihistamine.    Chronic back pain history; He had out patient back surgery by Dr. Yetta [name] at L3-4, L foraminectomy on 11/19/2019, has some mild residual sx, then in July had sudden worsening of pain. Now followed by pain management Dr. Vear [name]. Recent lumbar MRI on 07/18/2020 showed acute/early subacute L2 inferior endplate compression fracture with mild height loss. 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 Had L5 nerve root block, then on 10/19/2020 had hemilaminectomy in L4-5 with progressive improvement. He is prescribed oxycodone 5 mg by Dr. Vear [name],  very infrequent, planning to stop taking.  Takes 2 extra strength tylenol daily.    Patient is on an ADD medication, he states that the medication is helping and he denies any adverse reactions. Takes adderall 10 mg 2-3 days/week for focus.   He has insomnia and as been prescribed ativan 0.5 mg ~5 days per week. Tried belsomra but with AM sedation.   He has a diagnosis of GERD which is currently managed by nexium 40 mg every other day.  He had failed attempts to Pepcid.  Symptoms have increased to daily, having to take Nexium daily and is helping.  Has gained 3 lb and is not eating as well now that son has moved in with him.  He is eating later in the evenings, around 8 pm and eating heavier meals.  He has a colonoscopy planned 07/2023.     BMI is Body mass index is 23.87 kg/m., he has been working on diet, exercise. Wt Readings from Last 3 Encounters:  09/24/23 152 lb 6.4 oz (69.1 kg)  09/09/23 150 lb (68 kg)  06/19/23 152 lb 9.6 oz (69.2 kg)   His blood pressure has been controlled at home, today however, above goal in clinic.  States he has been taking at home and will have occasional spikes in the 160s.  Feels as though it may be diet related, intake of more salt.  Their BP is BP: 134/80  He does not workout. He denies chest pain, shortness of breath, dizziness.  He has CKD III followed by nephrology, [facility],  Lab Results  Component Value Date   GFRNONAA 49 (L) 06/07/2021   GFRNONAA 51 (L) 03/01/2021   GFRNONAA 48 (L) 11/23/2020    He is on cholesterol medication and denies myalgias. His LDL cholesterol is at goal; triglycerides remain elevated. The cholesterol last visit was:   Lab Results  Component Value Date   CHOL 162 06/19/2023   HDL 56 06/19/2023   LDLCALC 74  06/19/2023   TRIG 231 (H) 06/19/2023   CHOLHDL 2.9 06/19/2023    He has been working on diet and exercise for glucose management, and denies foot ulcerations, nausea, polydipsia, polyuria, visual  disturbances, vomiting and weight loss. Last A1C in the office was:  Lab Results  Component Value Date   HGBA1C 5.2 06/19/2023   Patient is on Vitamin D supplement taking 2000 IU for kidney protection.  Lab Results  Component Value Date   VD25OH 63 06/19/2023      Current Medications:   Current Outpatient Medications (Endocrine & Metabolic):    dexamethasone (DECADRON) 4 MG tablet, Take 2 to 3 x  / day Sparingly as Directed for Severe Back Pain  Current Outpatient Medications (Cardiovascular):    atenolol (TENORMIN) 50 MG tablet, TAKE 1 TABLET BY MOUTH DAILY FOR BLOOD PRESSURE   atorvastatin (LIPITOR) 40 MG tablet, TAKE 1 TABLET BY MOUTH DAILY FOR CHOLESTEROL   losartan (COZAAR) 50 MG tablet, TAKE 1 TABLET BY MOUTH DAILY   Current Outpatient Medications (Analgesics):    acetaminophen (TYLENOL) 500 MG tablet, Take 500 mg by mouth every 6 (six) hours as needed.    PERCOCET 5-325 MG tablet, Take 1 tablet by mouth 2 (two) times daily as needed.   Current Outpatient Medications (Other):    amphetamine-dextroamphetamine (ADDERALL) 20 MG tablet, Take 1 tablet by mouth 2 (two) times daily.   Cholecalciferol (VITAMIN D PO), Take 2,000 Units by mouth daily.   esomeprazole (NEXIUM) 40 MG capsule, Take  1 capsule  Daily  to Prevent Heartburn & Indigestion (Patient taking differently: Take 40 mg by mouth every other day. Take  1 capsule  Daily  to Prevent Heartburn & Indigestion)   finasteride (PROSCAR) 5 MG tablet, Take 1 tablet (5 mg total) by mouth daily.   LORazepam (ATIVAN) 1 MG tablet, Take 1/2 - 1 tablet at Bedtime ONLY if needed for Sleep &  limit to 5 days /week to avoid Addiction & Dementia   valACYclovir (VALTREX) 500 MG tablet, Take 1 tablet (500 mg total) by mouth 3 (three) times daily. 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 FAMILY HISTORY His family history includes COPD in his father; Diabetes in his brother; Heart disease in his father; Osteoporosis in his mother; Schizophrenia in his mother. SOCIAL HISTORY He  reports that he has never smoked. He has never used smokeless tobacco. He reports current alcohol use. He reports that he does not use drugs.  Immunization History  Administered Date(s) Administered   Influenza Split 09/22/2013   Influenza, High Dose Seasonal PF 10/22/2019, 09/24/2023   Influenza-Unspecified 09/08/2017, 08/19/2018, 09/24/2020   PFIZER(Purple Top)SARS-COV-2 Vaccination 10/25/2020   PPD Test 09/13/2014, 07/25/2015, 08/08/2016, 09/08/2017, 10/09/2018   Pneumococcal Conjugate-13 01/22/2018, 10/22/2019   Pneumococcal Polysaccharide-23 01/19/2009   Td 07/22/2019   Tdap 01/19/2009   Zoster, Live 01/29/2013  Review of Systems:  Review of Systems  Constitutional:  Negative for malaise/fatigue and weight loss.  HENT:  Negative for congestion, hearing loss, sore throat and tinnitus.   Eyes:  Negative for blurred vision and double vision.  Respiratory:  Negative for cough (frequent dry cough during the day), sputum production, shortness of breath and wheezing.   Cardiovascular:  Negative for chest pain, palpitations, orthopnea, claudication and leg swelling.  Gastrointestinal:  Negative for abdominal pain, blood in stool, constipation, diarrhea, heartburn, melena, nausea and vomiting.  Genitourinary: Negative.   Musculoskeletal:  Positive for back pain.  Negative for joint pain and myalgias.  Skin:  Negative for rash.  Neurological:  Positive for focal weakness. Negative for dizziness, tingling, sensory change, weakness and headaches.  Endo/Heme/Allergies:  Positive for environmental allergies. Negative for polydipsia.  Psychiatric/Behavioral: Negative.    All other systems reviewed and are negative.   Physical Exam: BP 134/80   Pulse 60   Temp (!) 97.4 F (36.3 C)   Ht 5\' 7"  (1.702 m)   Wt 152 lb 6.4 oz (69.1 kg)   SpO2 99%   BMI 23.87 kg/m  Wt Readings from Last 3 Encounters:  09/24/23 152 lb 6.4 oz (69.1 kg)  09/09/23 150 lb (68 kg)  06/19/23 152 lb 9.6 oz (69.2 kg)   General Appearance: Well nourished, in no apparent distress. Eyes: PERRLA, EOMs, conjunctiva no swelling or erythema Sinuses: No Frontal/maxillary tenderness ENT/Mouth: Ext aud canals clear, TMs without erythema, bulging. No erythema, swelling, or exudate on post pharynx.  Tonsils not swollen or erythematous. Hearing normal. Frequent dry brief cough/throat clearing.  Neck: Supple, thyroid normal.  Respiratory: Respiratory effort normal, BS equal bilaterally without rales, rhonchi, wheezing or stridor. Frequent dry cough. Cardio: RRR with no MRGs. Brisk peripheral pulses without edema.  Abdomen: Soft, + BS.  Non tender, no guarding, rebound, hernias, masses. Lymphatics: Non tender without lymphadenopathy.  Musculoskeletal: Full ROM (pain with ROM in lumbar), 5/5 strength, Normal gait, well healed lumbar surgical scar.  Skin: Warm, dry without rashes, lesions, ecchymosis.  Neuro: Cranial nerves intact. No cerebellar symptoms.  Psych: Awake and oriented X 3, normal affect, Insight and Judgment appropriate.   [name] Glimpse, NP 3:13 PM [facility]

## 2023-09-24 NOTE — Patient Instructions (Signed)
Allergies, Adult  An allergy is when your body reacts to something that bothers it (allergen).  Allergies often affect the nose, eyes, skin, and stomach. They cannot spread from person to person. Allergies can start at any age. Sometimes, they go away as you get older.  What are the causes?  Outdoor things, like pollen, car fumes, and mold.  Indoor things, like dust, smoke, mold, and pets.  Foods.  Medicines.  Things that bother your skin. These include perfumes and bug bites.  What increases the risk?  Having family members with allergies or asthma.  What are the signs or symptoms?  Allergies may cause:  A runny nose, stuffy nose, or sneezing.  An itchy mouth, ears, or throat.  A feeling of mucus dripping down the back of your throat.  A sore throat.  Eyes that itch or are red, watery, or puffy.  A skin rash or red, swollen areas of skin (hives).  Stomach cramps or bloating.  If you have a very bad allergic reaction (anaphylactic reaction) to food, medicine, or bug bites, you may also have:  A red face.  Coughing.  Swollen lips, tongue, or mouth.  A tight or swollen throat.  Chest pain. Your chest may feel tight. Your heart may beat too fast.  Trouble breathing.  Pain in your belly (abdomen). You may vomit or have watery poop (diarrhea).  Fainting. Or you may feel dizzy.  If you have a very bad reaction to an allergy, you should get help right away.  How is this treated?         You may need to use:  Cold, wet cloths. These can help with itching and swelling.  Eye drops, nose sprays, or skin creams.  A saline solution to wash out your nose. Saline solutions are made of salt and water.  A humidifier.  Medicines.  You may need to change the foods you eat.  You may be given allergy shots, or a small bit of the allergen may be put under your tongue. These treatments can help your body get used to the allergen.  If you have a very bad allergic reaction, you may need to use an auto-injector pen.  An auto-injector pen is  a device filled with medicine. It gives you an emergency shot of epinephrine.  Your doctor will teach you how to use it.  Follow these instructions at home:  Medicines    Take or apply over-the-counter and prescription medicines only as told by your doctor.  Keep an auto-injector pen with you all the time if you might have a very bad allergic reaction.  Eating and drinking  Follow instructions from your doctor about what you may eat and drink.  Drink enough fluid to keep your pee (urine) pale yellow.  General instructions  If you have ever had a bad reaction, wear a medical alert bracelet or necklace.  Stay away from the things you are allergic to.  Keep all follow-up visits. Your doctor will check on how you are doing and talk about treatment options with you.  Contact a doctor if:  You do not get better with treatment.  Get help right away if:  You have a very bad allergic reaction.  You use your auto-injector pen. You must go to the hospital even if the medicine seems to be working.  These symptoms may be an emergency. Use the auto-injector pen right away. Then call 911.  Do not wait to see if the symptoms  will go away.  Do not drive yourself to the hospital.  This information is not intended to replace advice given to you by your health care provider. Make sure you discuss any questions you have with your health care provider.  Document Revised: 08/07/2022 Document Reviewed: 08/07/2022  Elsevier Patient Education  2024 [employer].

## 2023-09-25 LAB — CBC WITH DIFFERENTIAL/PLATELET
Absolute Lymphocytes: 1357 {cells}/uL (ref 850–3900)
Absolute Monocytes: 957 {cells}/uL — ABNORMAL HIGH (ref 200–950)
Basophils Absolute: 52 {cells}/uL (ref 0–200)
Basophils Relative: 0.6 %
Eosinophils Absolute: 70 {cells}/uL (ref 15–500)
Eosinophils Relative: 0.8 %
HCT: 50.6 % — ABNORMAL HIGH (ref 38.5–50.0)
Hemoglobin: 17.4 g/dL — ABNORMAL HIGH (ref 13.2–17.1)
MCH: 34.6 pg — ABNORMAL HIGH (ref 27.0–33.0)
MCHC: 34.4 g/dL (ref 32.0–36.0)
MCV: 100.6 fL — ABNORMAL HIGH (ref 80.0–100.0)
MPV: 9.8 fL (ref 7.5–12.5)
Monocytes Relative: 11 %
Neutro Abs: 6264 {cells}/uL (ref 1500–7800)
Neutrophils Relative %: 72 %
Platelets: 219 10*3/uL (ref 140–400)
RBC: 5.03 10*6/uL (ref 4.20–5.80)
RDW: 12.4 % (ref 11.0–15.0)
Total Lymphocyte: 15.6 %
WBC: 8.7 10*3/uL (ref 3.8–10.8)

## 2023-09-25 LAB — LIPID PANEL
Cholesterol: 154 mg/dL (ref <200)
HDL: 73 mg/dL (ref >=40)
LDL Cholesterol (Calc): 51 mg/dL
Non-HDL Cholesterol (Calc): 81 mg/dL (ref <130)
Total CHOL/HDL Ratio: 2.1 (calc) (ref <5.0)
Triglycerides: 240 mg/dL — ABNORMAL HIGH (ref <150)

## 2023-09-25 LAB — COMPLETE METABOLIC PANEL WITH GFR
AG Ratio: 2 (calc) (ref 1.0–2.5)
ALT: 46 U/L (ref 9–46)
AST: 36 U/L — ABNORMAL HIGH (ref 10–35)
Albumin: 4.3 g/dL (ref 3.6–5.1)
Alkaline phosphatase (APISO): 80 U/L (ref 35–144)
BUN: 22 mg/dL (ref 7–25)
CO2: 27 mmol/L (ref 20–32)
Calcium: 9.2 mg/dL (ref 8.6–10.3)
Chloride: 97 mmol/L — ABNORMAL LOW (ref 98–110)
Creat: 1.12 mg/dL (ref 0.70–1.28)
Globulin: 2.2 g/dL (ref 1.9–3.7)
Glucose, Bld: 88 mg/dL (ref 65–99)
Potassium: 4.4 mmol/L (ref 3.5–5.3)
Sodium: 136 mmol/L (ref 135–146)
Total Bilirubin: 1 mg/dL (ref 0.2–1.2)
Total Protein: 6.5 g/dL (ref 6.1–8.1)
eGFR: 70 mL/min/{1.73_m2} (ref >=60)

## 2023-10-02 NOTE — Progress Notes (Signed)
[facility] History and Physical   Primary Care Physician:  Lucky Cowboy, MD   Reason for Procedure:  History of colon polyps  Plan:    Colonoscopy     HPI: [name] [name] is a 72 y.o. male previously followed by Dr. Randa Evens.  He had a colonoscopy in 2019 that was negative for polyps but there was a personal history of polyps and routine surveillance examination was recommended for 5 years subsequently.   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 [name]   COLONOSCOPY W/ POLYPECTOMY     ESOPHAGOGASTRODUODENOSCOPY  1990   TOTAL HIP ARTHROPLASTY Left 10/28/2014   Procedure: TOTAL HIP ARTHROPLASTY ANTERIOR APPROACH;  Surgeon: Eldred Manges, MD;  Location: [facility] OR;  Service: Orthopedics;  Laterality: Left;    Prior to Admission medications   Medication Sig Start Date End Date Taking? Authorizing Provider  acetaminophen (TYLENOL) 500 MG tablet Take 500 mg by mouth every 6 (six) hours as needed.     [provider]  amphetamine-dextroamphetamine (ADDERALL) 20 MG tablet Take 1 tablet by mouth 2 (two) times daily.    [provider]  atenolol (TENORMIN) 50 MG tablet TAKE 1 TABLET BY MOUTH DAILY FOR BLOOD PRESSURE 04/14/23   Adela Glimpse, NP  atorvastatin (LIPITOR) 40 MG tablet TAKE 1 TABLET BY MOUTH DAILY FOR CHOLESTEROL 02/27/23   Raynelle Dick, NP  Cholecalciferol (VITAMIN D PO) Take 2,000 Units by mouth daily.    [provider]  dexamethasone (DECADRON) 4 MG tablet Take 2 to 3 x  / day Sparingly as Directed for Severe Back Pain 06/19/23   Lucky Cowboy, MD  esomeprazole (NEXIUM) 40 MG capsule Take  1 capsule   Daily  to Prevent Heartburn & Indigestion Patient taking differently: Take 40 mg by mouth every other day. Take  1 capsule  Daily  to Prevent Heartburn & Indigestion 12/16/22   Lucky Cowboy, MD  finasteride (PROSCAR) 5 MG tablet Take 1 tablet (5 mg total) by mouth daily. 07/27/18   Doree Albee, PA-C  losartan (COZAAR) 50 MG tablet TAKE 1 TABLET BY MOUTH DAILY 09/15/23   Raynelle Dick, NP  PERCOCET 5-325 MG tablet Take 1 tablet by mouth 2 (two) times daily as needed.    [provider]  valACYclovir (VALTREX) 500 MG tablet Take 1 tablet (500 mg total) by mouth 3 (three) times daily. TAKE 1 TABLET BY MOUTH THREE TIMES DAILY FOR FEVER BLISTERS AS Needed 09/24/23   Adela Glimpse, NP    Current Outpatient Medications  Medication Sig Dispense Refill   acetaminophen (TYLENOL) 500 MG tablet Take 500 mg by mouth every 6 (six) hours as needed.      amphetamine-dextroamphetamine (ADDERALL) 20 MG tablet Take 1 tablet by mouth 2 (two) times daily.     atenolol (TENORMIN) 50 MG tablet TAKE 1 TABLET BY MOUTH DAILY FOR BLOOD PRESSURE 90 tablet 3   atorvastatin (LIPITOR) 40 MG tablet TAKE 1 TABLET BY MOUTH DAILY FOR CHOLESTEROL 90 tablet 3   Cholecalciferol (VITAMIN D PO) Take 2,000 Units by mouth daily.  esomeprazole (NEXIUM) 40 MG capsule Take  1 capsule  Daily  to Prevent Heartburn & Indigestion (Patient taking differently: Take 40 mg by mouth every other day. Take  1 capsule  Daily  to Prevent Heartburn & Indigestion) 90 capsule 3   finasteride (PROSCAR) 5 MG tablet Take 1 tablet (5 mg total) by mouth daily. 90 tablet 1   losartan (COZAAR) 50 MG tablet TAKE 1 TABLET BY MOUTH DAILY 90 tablet 1   PERCOCET 5-325 MG tablet Take 1 tablet by mouth 2 (two) times daily as needed.     zinc gluconate 50 MG tablet Take 50 mg by mouth daily.     dexamethasone (DECADRON) 4 MG tablet Take 2 to 3 x  / day Sparingly as Directed for Severe Back Pain 30 tablet 0   valACYclovir (VALTREX) 500 MG tablet  Take 1 tablet (500 mg total) by mouth 3 (three) times daily. TAKE 1 TABLET BY MOUTH THREE TIMES DAILY FOR FEVER BLISTERS AS Needed 30 tablet 0   Current Facility-Administered Medications  Medication Dose Route Frequency Provider Last Rate Last Admin   0.9 %  sodium chloride infusion  500 mL Intravenous Once Iva Boop, MD        Allergies as of 10/03/2023 - Review Complete 10/03/2023  Allergen Reaction Noted   Nsaids  03/01/2018    Family History  Problem Relation Age of Onset   Osteoporosis Mother    Schizophrenia Mother    Heart disease Father    COPD Father    Diabetes Brother    Colon cancer Neg Hx    Rectal cancer Neg Hx    Stomach cancer Neg Hx    Colon polyps Neg Hx    Esophageal cancer Neg Hx     Social History   Socioeconomic History   Marital status: Married    Spouse name: Not on file   Number of children: Not on file   Years of education: Not on file   Highest education level: Not on file  Occupational History   Not on file  Tobacco Use   Smoking status: Never   Smokeless tobacco: Never  Vaping Use   Vaping status: Never Used  Substance and Sexual Activity   Alcohol use: Yes    Comment: social   Drug use: No   Sexual activity: Not on file  Other Topics Concern   Not on file  Social History Narrative   ** Merged History Encounter **       Social Determinants of Health   Financial Resource Strain: Not on file  Food Insecurity: Not on file  Transportation Needs: Not on file  Physical Activity: Not on file  Stress: Not on file  Social Connections: Not on file  Intimate Partner Violence: Not on file    Review of Systems:  All other review of systems negative except as mentioned in the HPI.  Physical Exam: Vital signs BP 138/84   Pulse 71   Temp (!) 97.3 F (36.3 C) (Temporal)   Ht 5\' 7"  (1.702 m)   Wt 150 lb (68 kg)   SpO2 98%   BMI 23.49 kg/m   General:   Alert,  Well-developed, well-nourished, pleasant and cooperative in  NAD Lungs:  Clear throughout to auscultation.   Heart:  Regular rate and rhythm; no murmurs, clicks, rubs,  or gallops. Abdomen:  Soft, nontender and nondistended. Normal bowel sounds.   Neuro/Psych:  Alert and cooperative. Normal mood and affect. A and O x  3   @Marticia Reifschneider  Sena Slate, MD, [facility] [phone] (pager) 10/03/2023 10:40 AM@

## 2023-10-03 ENCOUNTER — Encounter: Payer: Self-pay | Admitting: Internal Medicine

## 2023-10-03 ENCOUNTER — Ambulatory Visit: Payer: PPO | Admitting: Internal Medicine

## 2023-10-03 VITALS — BP 136/73 | HR 62 | Temp 97.3°F | Resp 16 | Ht 67.0 in | Wt 150.0 lb

## 2023-10-03 DIAGNOSIS — E785 Hyperlipidemia, unspecified: Secondary | ICD-10-CM | POA: Diagnosis not present

## 2023-10-03 DIAGNOSIS — D128 Benign neoplasm of rectum: Secondary | ICD-10-CM

## 2023-10-03 DIAGNOSIS — Z1211 Encounter for screening for malignant neoplasm of colon: Secondary | ICD-10-CM | POA: Diagnosis not present

## 2023-10-03 DIAGNOSIS — K635 Polyp of colon: Secondary | ICD-10-CM | POA: Diagnosis not present

## 2023-10-03 DIAGNOSIS — Z09 Encounter for follow-up examination after completed treatment for conditions other than malignant neoplasm: Secondary | ICD-10-CM | POA: Diagnosis not present

## 2023-10-03 DIAGNOSIS — Z860100 Personal history of colon polyps, unspecified: Secondary | ICD-10-CM | POA: Diagnosis not present

## 2023-10-03 DIAGNOSIS — D122 Benign neoplasm of ascending colon: Secondary | ICD-10-CM | POA: Diagnosis not present

## 2023-10-03 DIAGNOSIS — K621 Rectal polyp: Secondary | ICD-10-CM | POA: Diagnosis not present

## 2023-10-03 DIAGNOSIS — I10 Essential (primary) hypertension: Secondary | ICD-10-CM | POA: Diagnosis not present

## 2023-10-03 MED ORDER — SODIUM CHLORIDE 0.9 % IV SOLN: 500.0000 mL | Freq: Once | INTRAVENOUS | Status: DC

## 2023-10-03 NOTE — Progress Notes (Signed)
Pt's states no medical or surgical changes since previsit or office visit. 

## 2023-10-03 NOTE — Patient Instructions (Addendum)
I removed 2 tiny polyps. I also saw diverticulosis and hemorrhoids.  Will make recommendations after pathology review.  I appreciate the opportunity to care for you. Iva Boop, MD, [facility]  Resume previous diet Continue present medications Await pathology results  Handouts/information given for polyps, diverticulosis and hemorrhoids  YOU HAD AN ENDOSCOPIC PROCEDURE TODAY AT THE [facility]:   Refer to the procedure report that was given to you for any specific questions about what was found during the examination.  If the procedure report does not answer your questions, please call your gastroenterologist to clarify.  If you requested that your care partner not be given the details of your procedure findings, then the procedure report has been included in a sealed envelope for you to review at your convenience later.  YOU SHOULD EXPECT: Some feelings of bloating in the abdomen. Passage of more gas than usual.  Walking can help get rid of the air that was put into your GI tract during the procedure and reduce the bloating. If you had a lower endoscopy (such as a colonoscopy or flexible sigmoidoscopy) you may notice spotting of blood in your stool or on the toilet paper. If you underwent a bowel prep for your procedure, you may not have a normal bowel movement for a few days.  Please Note:  You might notice some irritation and congestion in your nose or some drainage.  This is from the oxygen used during your procedure.  There is no need for concern and it should clear up in a day or so.  SYMPTOMS TO REPORT IMMEDIATELY:  Following lower endoscopy (colonoscopy):  Excessive amounts of blood in the stool  Significant tenderness or worsening of abdominal pains  Swelling of the abdomen that is new, acute  Fever of 100F or higher  For urgent or emergent issues, a gastroenterologist can be reached at any hour by calling [phone]. Do not use MyChart messaging for urgent  concerns.   DIET:  We do recommend a small meal at first, but then you may proceed to your regular diet.  Drink plenty of fluids but you should avoid alcoholic beverages for 24 hours.  ACTIVITY:  You should plan to take it easy for the rest of today and you should NOT DRIVE or use heavy machinery until tomorrow (because of the sedation medicines used during the test).    FOLLOW UP: Our staff will call the number listed on your records the next business day following your procedure.  We will call around 7:15- 8:00 am to check on you and address any questions or concerns that you may have regarding the information given to you following your procedure. If we do not reach you, we will leave a message.     If any biopsies were taken you will be contacted by phone or by letter within the next 1-3 weeks.  Please call us at [phone] if you have not heard about the biopsies in 3 weeks.    SIGNATURES/CONFIDENTIALITY: You and/or your care partner have signed paperwork which will be entered into your electronic medical record.  These signatures attest to the fact that that the information above on your After Visit Summary has been reviewed and is understood.  Full responsibility of the confidentiality of this discharge information lies with you and/or your care-partner.

## 2023-10-03 NOTE — Progress Notes (Signed)
Called to room to assist during endoscopic procedure.  Patient ID and intended procedure confirmed with present staff. Received instructions for my participation in the procedure from the performing physician.  

## 2023-10-03 NOTE — Op Note (Signed)
[facility] Patient Name: [name] [name] Procedure Date: 10/03/2023 10:30 AM MRN: [id number] Endoscopist: Iva Boop , MD, [id number] Age: 72 Referring MD:  Date of Birth: 1950/12/26 Gender: Male Account #: [id number] Procedure:                Colonoscopy Indications:              High risk colon cancer surveillance: Personal                            history of colonic polyps, Last colonoscopy: 2019 Medicines:                Monitored Anesthesia Care Procedure:                Pre-Anesthesia Assessment:                           - Prior to the procedure, a History and Physical                            was performed, and patient medications and                            allergies were reviewed. The patient's tolerance of                            previous anesthesia was also reviewed. The risks                            and benefits of the procedure and the sedation                            options and risks were discussed with the patient.                            All questions were answered, and informed consent                            was obtained. Prior Anticoagulants: The patient has                            taken no anticoagulant or antiplatelet agents. ASA                            Grade Assessment: II - A patient with mild systemic                            disease. After reviewing the risks and benefits,                            the patient was deemed in satisfactory condition to                            undergo the procedure.  After obtaining informed consent, the colonoscope                            was passed under direct vision. Throughout the                            procedure, the patient's blood pressure, pulse, and                            oxygen saturations were monitored continuously. The                            Olympus Scope SN: [id number] was introduced through                            the anus  and advanced to the the cecum, identified                            by appendiceal orifice and ileocecal valve. The                            colonoscopy was performed without difficulty. The                            patient tolerated the procedure well. The quality                            of the bowel preparation was good. The ileocecal                            valve, appendiceal orifice, and rectum were                            photographed. The bowel preparation used was                            Miralax via split dose instruction. Scope In: 10:46:54 AM Scope Out: 11:03:37 AM Scope Withdrawal Time: 0 hours 12 minutes 0 seconds  Total Procedure Duration: 0 hours 16 minutes 43 seconds  Findings:                 The perianal and digital rectal examinations were                            normal.                           A 4 mm polyp was found in the proximal rectum. The                            polyp was sessile. The polyp was removed with a                            cold snare. Resection and retrieval were complete.  Verification of patient identification for the                            specimen was done. Estimated blood loss was minimal.                           A 2 mm polyp was found in the ascending colon. The                            polyp was sessile. The polyp was removed with a                            cold biopsy forceps. Resection and retrieval were                            complete. Verification of patient identification                            for the specimen was done. Estimated blood loss was                            minimal.                           Multiple diverticula were found in the sigmoid                            colon and ascending colon.                           Internal hemorrhoids were found.                           The exam was otherwise without abnormality on                            direct and  retroflexion views. Complications:            No immediate complications. Estimated Blood Loss:     Estimated blood loss was minimal. Impression:               - One 4 mm polyp in the proximal rectum, removed                            with a cold snare. Resected and retrieved.                           - One 2 mm polyp in the ascending colon, removed                            with a cold biopsy forceps. Adjacent hypervascular                            chage ? AVM or is this site of a prior polypectomy?  Resected and retrieved.                           - Diverticulosis in the sigmoid colon and in the                            ascending colon. Sigmoid w/ associated colonic                            narrowing.                           - Internal hemorrhoids.                           - The examination was otherwise normal on direct                            and retroflexion views.                           - Personal history of colonic polyps. Prior                            polypectomy by Dr. Carman Ching in past. Recommendation:           - Patient has a contact number available for                            emergencies. The signs and symptoms of potential                            delayed complications were discussed with the                            patient. Return to normal activities tomorrow.                            Written discharge instructions were provided to the                            patient.                           - Resume previous diet.                           - Continue present medications.                           - No recommendation at this time regarding repeat                            colonoscopy.                           - Await pathology results. Iva Boop, MD 10/03/2023 11:20:35 AM This  report has been signed electronically.

## 2023-10-03 NOTE — Progress Notes (Signed)
Vss nad trans to pacu 

## 2023-10-06 ENCOUNTER — Telehealth: Payer: Self-pay

## 2023-10-06 NOTE — Telephone Encounter (Signed)
Attempted f/u call. No answer, left VM. 

## 2023-10-08 ENCOUNTER — Encounter: Payer: Self-pay | Admitting: Internal Medicine

## 2023-10-08 LAB — SURGICAL PATHOLOGY

## 2023-10-10 DIAGNOSIS — G8918 Other acute postprocedural pain: Secondary | ICD-10-CM | POA: Diagnosis not present

## 2023-10-10 DIAGNOSIS — G5622 Lesion of ulnar nerve, left upper limb: Secondary | ICD-10-CM | POA: Diagnosis not present

## 2023-10-17 DIAGNOSIS — M25622 Stiffness of left elbow, not elsewhere classified: Secondary | ICD-10-CM | POA: Diagnosis not present

## 2023-10-23 DIAGNOSIS — M25622 Stiffness of left elbow, not elsewhere classified: Secondary | ICD-10-CM | POA: Diagnosis not present

## 2023-12-15 ENCOUNTER — Other Ambulatory Visit: Payer: Self-pay | Admitting: Internal Medicine

## 2023-12-15 DIAGNOSIS — K219 Gastro-esophageal reflux disease without esophagitis: Secondary | ICD-10-CM

## 2023-12-15 MED ORDER — ESOMEPRAZOLE MAGNESIUM 40 MG PO CPDR: DELAYED_RELEASE_CAPSULE | ORAL | 3 refills | Status: AC

## 2023-12-17 ENCOUNTER — Encounter: Payer: Self-pay | Admitting: Internal Medicine

## 2023-12-17 NOTE — Patient Instructions (Signed)

## 2023-12-17 NOTE — Progress Notes (Signed)
 [facility]      ADULT   &   ADOLESCENT      INTERNAL MEDICINE  Elsie Richards, M.D.          Lonell Rous, ANP        Bascom Necessary, FNP  [facility] [address]  [place], [place]. [address] Telephone [phone] Telefax [phone]   Annual  Screening/Preventative Visit  & Comprehensive Evaluation & Examination   Future Appointments  Date Time Provider Department  12/18/2023                      cpe 11:00 AM Richards Elsie, MD [facility]  03/17/2024                      wellness  2:00 PM Necessary Bascom, NP [facility]  12/28/2024                       cpe 10:00 AM Richards Elsie, MD [facility]         This very nice 73 y.o. MWM  with  HTN, HLD, Prediabetes and Vitamin D  Deficiency  presents for a Screening /Preventative Visit & comprehensive evaluation and management of multiple medical co-morbidities.  Patient relates his wife's concern re: his constanr throat clearing cough.        Patient relates onset of his back pain to fall from a ladder in 2016.  He had several subsequent neg MRI exams and also failed trials with EDSI, dry needling, Physical therapy and Lyrica  /Gabapentin  .  In Dec 2020, patient had a laminectomy by Dr Joshua. He continues to have intermittent Rt sciatica.              HTN predates since 64 Patient's BP has been controlled at home.  Patient has CKD3a (GFR 57) and is followed bu Nephrology ( Dr Dolan) .Today's BP is at goal - 124/70 . Patient denies any cardiac symptoms as chest pain, palpitations, shortness of breath, dizziness or ankle swelling.       Patient's hyperlipidemia is controlled with diet and Atorvastatin  . Patient denies myalgias or other medication SE's. Last lipids were  at goal :  Lab Results  Component Value Date   CHOL 154 09/24/2023   HDL 73 09/24/2023   LDLCALC 51 09/24/2023   TRIG 240 (H) 09/24/2023   CHOLHDL 2.1 09/24/2023         Patient has hx/o  prediabetes  & Insulin  Resistance  (Insulin  28.6 /2016 & 33.7 /2018) and patient denies reactive hypoglycemic symptoms, visual blurring, diabetic polys or paresthesias. Last A1c was at goal :   Lab Results  Component Value Date   HGBA1C 5.1 12/11/2021         Finally, patient has history of Vitamin D  Deficiency  ( 28 /2008) and last vitamin D  was at goal:   Lab Results  Component Value Date   VD25OH 56 12/11/2021       Current Outpatient Medications  Medication Instructions   acetaminophen  500 mg Every 6 hours PRN   atenolol  50 MG tablet TAKE 1 TABLET  DAILY    atorvastatin   40 MG tablet TAKE 1 TABLET  DAILY    VITAMIN D   2,000 Units Daily   esomeprazole   40 MG capsule TAKE ONE CAPSULE DAILY    finasteride  5 mg  Daily   LORazepam   1 MG tablet Take   1/2 - 1 tab  at Bedtime ONLY  if needed   losartan  50 mg  Daily   valACYclovir  500 mg  3 times daily AS Needed     Allergies  Allergen Reactions   Nsaids     Renal Insufficiency     Past Medical History:  Diagnosis Date   Arthritis    right thumb   Avascular necrosis of left femoral head (HCC) 10/28/2014   Dry eyes    Takes Restasis    GERD (gastroesophageal reflux disease)    Hyperlipidemia    Hypertension    Vitamin D  deficiency      Health Maintenance  Topic Date Due   Zoster Vaccines- Shingrix (1 of 2) Never done   Pneumonia Vaccine 46+ Years old (3 - PPSV23 if available, else PCV20) 10/21/2020   COVID-19 Vaccine (2 - Pfizer risk series) 11/15/2020   INFLUENZA VACCINE  07/09/2021   TETANUS/TDAP  07/21/2029   Hepatitis C Screening  Completed   HPV VACCINES  Aged Out     Immunization History  Administered Date(s) Administered   Influenza Split 09/22/2013   Influenza, High Dose  10/22/2019   Influenza 09/08/2017, 08/19/2018, 09/24/2020   PFIZER SARS-COV-2 Vacc 10/25/2020   PPD Test 09/08/2017, 10/09/2018   Pneumococcal -13 01/22/2018, 10/22/2019   Pneumococcal -23 01/19/2009   Td 07/22/2019   Tdap 01/19/2009   Zoster, Live  01/29/2013    Last Colon - 07/14/2018 - Dr Celestia - Negative - Recc 10 yr f/u due Aug 2029    Past Surgical History:  Procedure Laterality Date   BACK SURGERY  11/19/2019   L3-4 R foraminectomy, Dr. Joshua   COLONOSCOPY W/ POLYPECTOMY     ESOPHAGOGASTRODUODENOSCOPY  1990   TOTAL HIP ARTHROPLASTY Left 10/28/2014   Procedure: TOTAL HIP ARTHROPLASTY ANTERIOR APPROACH;  Surgeon: Oneil JAYSON Herald, MD;  Location: [facility] OR;  Service: Orthopedics;  Laterality: Left;     Family History  Problem Relation Age of Onset   Osteoporosis Mother    Schizophrenia Mother    Heart disease Father    COPD Father    Diabetes Brother      Social History   Tobacco Use   Smoking status: Never   Smokeless tobacco: Never  Substance Use Topics   Alcohol use: Yes    Comment: social   Drug use: No      ROS Constitutional: Denies fever, chills, weight loss/gain, headaches, insomnia,  night sweats or change in appetite. Does c/o fatigue. Eyes: Denies redness, blurred vision, diplopia, discharge, itchy or watery eyes.  ENT: Denies discharge, congestion, post nasal drip, epistaxis, sore throat, earache, hearing loss, dental pain, Tinnitus, Vertigo, Sinus pain or snoring.  Cardio: Denies chest pain, palpitations, irregular heartbeat, syncope, dyspnea, diaphoresis, orthopnea, PND, claudication or edema Respiratory: denies cough, dyspnea, DOE, pleurisy, hoarseness, laryngitis or wheezing.  Gastrointestinal: Denies dysphagia, heartburn, reflux, water brash, pain, cramps, nausea, vomiting, bloating, diarrhea, constipation, hematemesis, melena, hematochezia, jaundice or hemorrhoids Genitourinary: Denies dysuria, frequency, urgency, nocturia, hesitancy, discharge, hematuria or flank pain Musculoskeletal: Denies arthralgia, myalgia, stiffness, Jt. Swelling, pain, limp or strain/sprain. Denies Falls. Skin: Denies puritis, rash, hives, warts, acne, eczema or change in skin lesion Neuro: No weakness, tremor,  incoordination, spasms, paresthesia or pain Psychiatric: Denies confusion, memory loss or sensory loss. Denies Depression. Endocrine: Denies change in weight, skin, hair change, nocturia, and paresthesia, diabetic polys, visual blurring or hyper / hypo glycemic episodes.  Heme/Lymph: No excessive bleeding, bruising or enlarged lymph nodes.   Physical Exam  BP 124/70   Pulse 83   Temp  97.9 F (36.6 C)   Resp 16   Ht 5' 7 (1.702 m)   Wt 157 lb 9.6 oz (71.5 kg)   SpO2 99%   BMI 24.68 kg/m   General Appearance: Well nourished and well groomed and in no apparent distress.  Eyes: PERRLA, EOMs, conjunctiva no swelling or erythema, normal fundi and vessels. Sinuses: No frontal/maxillary tenderness ENT/Mouth: EACs patent / TMs  nl. Nares clear without erythema, swelling, mucoid exudates. Oral hygiene is good. No erythema, swelling, or exudate. Tongue normal, non-obstructing. Tonsils not swollen or erythematous. Hearing normal.  Neck: Supple, thyroid  not palpable. No bruits, nodes or JVD. Respiratory: Respiratory effort normal.  BS equal and clear bilateral without rales, rhonci, wheezing or stridor. Cardio: Heart sounds are normal with regular rate and rhythm and no murmurs, rubs or gallops. Peripheral pulses are normal and equal bilaterally without edema. No aortic or femoral bruits. Chest: symmetric with normal excursions and percussion.  Abdomen: Soft, with Nl bowel sounds. Nontender, no guarding, rebound, hernias, masses, or organomegaly.  Lymphatics: Non tender without lymphadenopathy.  Musculoskeletal: Full ROM all peripheral extremities, joint stability, 5/5 strength, and normal gait. Skin: Warm and dry without rashes, lesions, cyanosis, clubbing or  ecchymosis.  Neuro: Cranial nerves intact, reflexes equal bilaterally. Normal muscle tone, no cerebellar symptoms. Sensation intact.  Pysch: Alert and oriented x 3 with normal affect, insight and judgment appropriate.   Assessment and  Plan   1. Encounter for general adult medical examination with abnormal findings   2. Labile hypertension  - EKG 12-Lead - US , RETROPERITNL ABD,  LTD - Urinalysis, Routine w reflex microscopic - Microalbumin / creatinine urine ratio - CBC with Differential/Platelet - COMPLETE METABOLIC PANEL WITH GFR - Magnesium  - TSH  3. Hyperlipidemia, mixed  - EKG 12-Lead - US , RETROPERITNL ABD,  LTD - Lipid panel - TSH  4. Abnormal glucose  - EKG 12-Lead - US , RETROPERITNL ABD,  LTD - Hemoglobin A1c - Insulin , random  5. Vitamin D  deficiency  - EKG 12-Lead - US , RETROPERITNL ABD,  LTD - VITAMIN D  25 Hydroxy (Vit-D Deficiency, Fractures)  6. Stage 3b chronic kidney disease (HCC)  - EKG 12-Lead - US , RETROPERITNL ABD,  LTD - COMPLETE METABOLIC PANEL WITH GFR - Parathyroid  hormone, intact (no Ca)  7. Idiopathic gout   - EKG 12-Lead - Uric acid  8. BPH with obstruction/lower urinary tract symptoms  - PSA  9. Prostate cancer screening  - PSA  10. Screening for colorectal cancer  - POC Hemoccult Bld/Stl   11. Screening for heart disease  - EKG 12-Lead  12. FHx: heart disease  - EKG 12-Lead - US , RETROPERITNL ABD,  LTD  13. Screening for AAA (aortic abdominal aneurysm)  - US , RETROPERITNL ABD,  LTD  14. Chronic low back pain with sciatica   15. Medication management  - Urinalysis, Routine w reflex microscopic - Microalbumin / creatinine urine ratio - CBC with Differential/Platelet - COMPLETE METABOLIC PANEL WITH GFR - Magnesium  - Lipid panel - TSH - Hemoglobin A1c - Insulin , random - VITAMIN D  25 Hydroxy - Uric acid        Patient was counseled in prudent diet, weight control to achieve/maintain BMI less than 25, BP monitoring, regular exercise and medications as discussed.  Discussed med effects and SE's. Routine screening labs and tests as requested with regular follow-up as recommended. Over 40 minutes of exam, counseling, chart review and  high complex critical decision making was performed   Elsie JONETTA Richards, MD

## 2023-12-18 ENCOUNTER — Ambulatory Visit (INDEPENDENT_AMBULATORY_CARE_PROVIDER_SITE_OTHER): Payer: PPO | Admitting: Internal Medicine

## 2023-12-18 ENCOUNTER — Encounter: Payer: Self-pay | Admitting: Internal Medicine

## 2023-12-18 VITALS — BP 124/70 | HR 83 | Temp 97.9°F | Resp 16 | Ht 67.0 in | Wt 157.6 lb

## 2023-12-18 DIAGNOSIS — E782 Mixed hyperlipidemia: Secondary | ICD-10-CM

## 2023-12-18 DIAGNOSIS — Z136 Encounter for screening for cardiovascular disorders: Secondary | ICD-10-CM

## 2023-12-18 DIAGNOSIS — R0989 Other specified symptoms and signs involving the circulatory and respiratory systems: Secondary | ICD-10-CM

## 2023-12-18 DIAGNOSIS — N1832 Chronic kidney disease, stage 3b: Secondary | ICD-10-CM

## 2023-12-18 DIAGNOSIS — E559 Vitamin D deficiency, unspecified: Secondary | ICD-10-CM

## 2023-12-18 DIAGNOSIS — G8929 Other chronic pain: Secondary | ICD-10-CM

## 2023-12-18 DIAGNOSIS — R7309 Other abnormal glucose: Secondary | ICD-10-CM

## 2023-12-18 DIAGNOSIS — Z0001 Encounter for general adult medical examination with abnormal findings: Secondary | ICD-10-CM

## 2023-12-18 DIAGNOSIS — Z8249 Family history of ischemic heart disease and other diseases of the circulatory system: Secondary | ICD-10-CM

## 2023-12-18 DIAGNOSIS — Z0000 Encounter for general adult medical examination without abnormal findings: Secondary | ICD-10-CM | POA: Diagnosis not present

## 2023-12-18 DIAGNOSIS — Z1211 Encounter for screening for malignant neoplasm of colon: Secondary | ICD-10-CM

## 2023-12-18 DIAGNOSIS — Z125 Encounter for screening for malignant neoplasm of prostate: Secondary | ICD-10-CM

## 2023-12-18 DIAGNOSIS — Z79899 Other long term (current) drug therapy: Secondary | ICD-10-CM

## 2023-12-18 DIAGNOSIS — I700 Atherosclerosis of aorta: Secondary | ICD-10-CM

## 2023-12-18 DIAGNOSIS — N138 Other obstructive and reflux uropathy: Secondary | ICD-10-CM

## 2023-12-18 DIAGNOSIS — M1000 Idiopathic gout, unspecified site: Secondary | ICD-10-CM

## 2023-12-18 MED ORDER — DEXAMETHASONE 2 MG PO TABS: ORAL_TABLET | ORAL | 0 refills | Status: AC

## 2023-12-18 MED ORDER — MONTELUKAST SODIUM 10 MG PO TABS
ORAL_TABLET | ORAL | 3 refills | Status: AC
Start: 2023-12-18 — End: ?

## 2023-12-18 MED ORDER — ACYCLOVIR 800 MG PO TABS: ORAL_TABLET | ORAL | 3 refills | Status: AC

## 2023-12-19 LAB — MICROALBUMIN / CREATININE URINE RATIO
Creatinine, Urine: 159 mg/dL (ref 20–320)
Microalb Creat Ratio: 2 mg/g{creat} (ref <30)
Microalb, Ur: 0.3 mg/dL

## 2023-12-19 LAB — CBC WITH DIFFERENTIAL/PLATELET
Absolute Lymphocytes: 1622 {cells}/uL (ref 850–3900)
Absolute Monocytes: 807 {cells}/uL (ref 200–950)
Basophils Absolute: 48 {cells}/uL (ref 0–200)
Basophils Relative: 0.7 %
Eosinophils Absolute: 69 {cells}/uL (ref 15–500)
Eosinophils Relative: 1 %
HCT: 50.8 % — ABNORMAL HIGH (ref 38.5–50.0)
Hemoglobin: 17.3 g/dL — ABNORMAL HIGH (ref 13.2–17.1)
MCH: 34.5 pg — ABNORMAL HIGH (ref 27.0–33.0)
MCHC: 34.1 g/dL (ref 32.0–36.0)
MCV: 101.4 fL — ABNORMAL HIGH (ref 80.0–100.0)
MPV: 10.2 fL (ref 7.5–12.5)
Monocytes Relative: 11.7 %
Neutro Abs: 4354 {cells}/uL (ref 1500–7800)
Neutrophils Relative %: 63.1 %
Platelets: 175 10*3/uL (ref 140–400)
RBC: 5.01 10*6/uL (ref 4.20–5.80)
RDW: 11.8 % (ref 11.0–15.0)
Total Lymphocyte: 23.5 %
WBC: 6.9 10*3/uL (ref 3.8–10.8)

## 2023-12-19 LAB — HEMOGLOBIN A1C
Hgb A1c MFr Bld: 5.2 %{Hb} (ref <5.7)
Mean Plasma Glucose: 103 mg/dL
eAG (mmol/L): 5.7 mmol/L

## 2023-12-19 LAB — LIPID PANEL
Cholesterol: 156 mg/dL (ref <200)
HDL: 58 mg/dL (ref >=40)
LDL Cholesterol (Calc): 67 mg/dL
Non-HDL Cholesterol (Calc): 98 mg/dL (ref <130)
Total CHOL/HDL Ratio: 2.7 (calc) (ref <5.0)
Triglycerides: 299 mg/dL — ABNORMAL HIGH (ref <150)

## 2023-12-19 LAB — COMPLETE METABOLIC PANEL WITH GFR
AG Ratio: 1.9 (calc) (ref 1.0–2.5)
ALT: 37 U/L (ref 9–46)
AST: 34 U/L (ref 10–35)
Albumin: 4 g/dL (ref 3.6–5.1)
Alkaline phosphatase (APISO): 69 U/L (ref 35–144)
BUN/Creatinine Ratio: 20 (calc) (ref 6–22)
BUN: 27 mg/dL — ABNORMAL HIGH (ref 7–25)
CO2: 25 mmol/L (ref 20–32)
Calcium: 9 mg/dL (ref 8.6–10.3)
Chloride: 101 mmol/L (ref 98–110)
Creat: 1.33 mg/dL — ABNORMAL HIGH (ref 0.70–1.28)
Globulin: 2.1 g/dL (ref 1.9–3.7)
Glucose, Bld: 86 mg/dL (ref 65–99)
Potassium: 3.6 mmol/L (ref 3.5–5.3)
Sodium: 138 mmol/L (ref 135–146)
Total Bilirubin: 0.6 mg/dL (ref 0.2–1.2)
Total Protein: 6.1 g/dL (ref 6.1–8.1)
eGFR: 57 mL/min/{1.73_m2} — ABNORMAL LOW (ref >=60)

## 2023-12-19 LAB — PSA: PSA: 1.23 ng/mL (ref <=4.00)

## 2023-12-19 LAB — VITAMIN D 25 HYDROXY (VIT D DEFICIENCY, FRACTURES): Vit D, 25-Hydroxy: 44 ng/mL (ref 30–100)

## 2023-12-19 LAB — URINALYSIS, ROUTINE W REFLEX MICROSCOPIC
Bilirubin Urine: NEGATIVE
Glucose, UA: NEGATIVE
Hgb urine dipstick: NEGATIVE
Ketones, ur: NEGATIVE
Leukocytes,Ua: NEGATIVE
Nitrite: NEGATIVE
Protein, ur: NEGATIVE
Specific Gravity, Urine: 1.021 (ref 1.001–1.035)
pH: 6 (ref 5.0–8.0)

## 2023-12-19 LAB — PARATHYROID HORMONE, INTACT (NO CA): PTH: 43 pg/mL (ref 16–77)

## 2023-12-19 LAB — MAGNESIUM: Magnesium: 1.8 mg/dL (ref 1.5–2.5)

## 2023-12-19 LAB — URIC ACID: Uric Acid, Serum: 7.6 mg/dL (ref 4.0–8.0)

## 2023-12-19 LAB — TSH: TSH: 1.18 m[IU]/L (ref 0.40–4.50)

## 2023-12-19 LAB — INSULIN, RANDOM: Insulin: 63.3 u[IU]/mL — ABNORMAL HIGH

## 2023-12-21 ENCOUNTER — Other Ambulatory Visit: Payer: Self-pay | Admitting: Nurse Practitioner

## 2023-12-21 ENCOUNTER — Encounter: Payer: Self-pay | Admitting: Internal Medicine

## 2023-12-21 DIAGNOSIS — E782 Mixed hyperlipidemia: Secondary | ICD-10-CM

## 2023-12-21 NOTE — Progress Notes (Signed)
 - Test results slightly outside the reference range are not unusual. If there is anything important, I will review this with you,  otherwise it is considered normal test values.  If you have further questions,  please do not hesitate to contact me at the office or via My Chart.   =========================================================================  -   GFR has decreased slightly from 70 to 57  !   - Kidney functions look a little dehydrated   -  Very important to drink adequate amounts of fluids                                                                                  to prevent permanent damage    - Recommend drink at least 6 bottles (16 ounces) of                                                                             fluids /water /day = 96 Oz ~100 oz / day !   - 100 oz = 3,000 cc or 3 liters / day  - >> That's 1 &1/2 bottles of a 2 liter soda bottle /day !   =========================================================================  -  Chol = 156   &   LDL === 67  -     Both   Excellent  !  - Very low risk for Heart Attack  / Stroke  =========================================================================  -  But Triglycerides ( = 299  ) or fats in blood are too high                 (   Ideal or  Goal is less than 150  !  )    - Recommend avoid fried & greasy foods,  sweets / candy,   - Avoid white rice  (brown or wild rice or Quinoa is OK),   - Avoid white potatoes  (sweet potatoes are OK)   - Avoid anything made from white flour                               - bagels, doughnuts, rolls, buns, biscuits, white and                                 wheat breads, pizza crust and traditional                                 pasta made of white flour & egg white  - (vegetarian pasta or spinach or wheat pasta is OK).    - Multi-grain bread is OK - like multi-grain flat bread or sandwich thins.   - Avoid alcohol in excess.   - Exercise is also  important.  =========================================================================  -  PSA - very  low - No Prostate cancer  - Great  !  =========================================================================  -   Magnesium   = 1.8 is very,  very  low- goal is betw 2.0 - 2.5,   - So..............SABRA  Recommend that you take                                                                    Magnesium  500 mg tablet 2 x / day with meals   - also important to eat lots of  leafy green vegetables   - spinach - Kale - collards - greens - okra - asparagus - broccoli - quinoa - squash   - almonds - black, red, white beans -  peas - green beans  =========================================================================  -  A1c = 5.2%  - Normal - No Diabetes  -  Great   !   =========================================================================  -  Vitamin D  = 44 is LOW   - Vitamin D  goal is between 70-100.    - Please INCREASE  your Vitamin D  up to  5,000 to 6,000 units / day    - It is very important as a natural anti-inflammatory and helping the                          immune system protect against viral infections, like Flu  & the Covid    - Also helps hair, skin, and nails, as well as reducing stroke and heart attack risk.   - It helps your bones  &  and helps with mood.  - It also decreases numerous cancer risks, so please                                                                                           take it as directed.   - Low Vit D is associated with a 200-300% higher risk for CANCER   and 200-300% higher risk for HEART   ATTACK  &  STROKE.    - It is also associated with higher death rate at younger ages,   autoimmune diseases like Rheumatoid arthritis, Lupus, Multiple Sclerosis.     - Also many other serious conditions, like depression, Alzheimer's Dementia                                                                             muscle aches,  fatigue, fibromyalgia   =========================================================================  -  Uric acid / gout test is normal - Please continue Allopurinol  same   =========================================================================  -  All Else - CBC -  Electrolytes - Liver  & Thyroid     - all  Normal / OK  ===========================================================

## 2023-12-31 ENCOUNTER — Encounter: Payer: Self-pay | Admitting: Internal Medicine

## 2024-01-07 ENCOUNTER — Other Ambulatory Visit: Payer: Self-pay | Admitting: Nurse Practitioner

## 2024-01-07 MED ORDER — AZITHROMYCIN 250 MG PO TABS: ORAL_TABLET | ORAL | 1 refills | Status: AC

## 2024-02-04 DIAGNOSIS — N1831 Chronic kidney disease, stage 3a: Secondary | ICD-10-CM | POA: Diagnosis not present

## 2024-02-04 DIAGNOSIS — E673 Hypervitaminosis D: Secondary | ICD-10-CM | POA: Diagnosis not present

## 2024-02-04 DIAGNOSIS — I129 Hypertensive chronic kidney disease with stage 1 through stage 4 chronic kidney disease, or unspecified chronic kidney disease: Secondary | ICD-10-CM | POA: Diagnosis not present

## 2024-02-09 DIAGNOSIS — M47817 Spondylosis without myelopathy or radiculopathy, lumbosacral region: Secondary | ICD-10-CM | POA: Diagnosis not present

## 2024-02-09 DIAGNOSIS — S32009S Unspecified fracture of unspecified lumbar vertebra, sequela: Secondary | ICD-10-CM | POA: Diagnosis not present

## 2024-02-09 DIAGNOSIS — M4125 Other idiopathic scoliosis, thoracolumbar region: Secondary | ICD-10-CM | POA: Diagnosis not present

## 2024-02-09 DIAGNOSIS — M5416 Radiculopathy, lumbar region: Secondary | ICD-10-CM | POA: Diagnosis not present

## 2024-02-24 DIAGNOSIS — M545 Low back pain, unspecified: Secondary | ICD-10-CM | POA: Diagnosis not present

## 2024-02-24 DIAGNOSIS — I129 Hypertensive chronic kidney disease with stage 1 through stage 4 chronic kidney disease, or unspecified chronic kidney disease: Secondary | ICD-10-CM | POA: Diagnosis not present

## 2024-02-24 DIAGNOSIS — E7800 Pure hypercholesterolemia, unspecified: Secondary | ICD-10-CM | POA: Diagnosis not present

## 2024-02-24 DIAGNOSIS — N1832 Chronic kidney disease, stage 3b: Secondary | ICD-10-CM | POA: Diagnosis not present

## 2024-02-24 DIAGNOSIS — L649 Androgenic alopecia, unspecified: Secondary | ICD-10-CM | POA: Diagnosis not present

## 2024-02-24 DIAGNOSIS — J302 Other seasonal allergic rhinitis: Secondary | ICD-10-CM | POA: Diagnosis not present

## 2024-03-17 ENCOUNTER — Ambulatory Visit: Payer: PPO | Admitting: Nurse Practitioner

## 2024-04-14 DIAGNOSIS — Z1283 Encounter for screening for malignant neoplasm of skin: Secondary | ICD-10-CM | POA: Diagnosis not present

## 2024-04-14 DIAGNOSIS — B078 Other viral warts: Secondary | ICD-10-CM | POA: Diagnosis not present

## 2024-04-14 DIAGNOSIS — X32XXXA Exposure to sunlight, initial encounter: Secondary | ICD-10-CM | POA: Diagnosis not present

## 2024-04-14 DIAGNOSIS — D225 Melanocytic nevi of trunk: Secondary | ICD-10-CM | POA: Diagnosis not present

## 2024-04-14 DIAGNOSIS — L570 Actinic keratosis: Secondary | ICD-10-CM | POA: Diagnosis not present

## 2024-06-04 DIAGNOSIS — H04123 Dry eye syndrome of bilateral lacrimal glands: Secondary | ICD-10-CM | POA: Diagnosis not present

## 2024-06-04 DIAGNOSIS — Z961 Presence of intraocular lens: Secondary | ICD-10-CM | POA: Diagnosis not present

## 2024-06-15 DIAGNOSIS — M4125 Other idiopathic scoliosis, thoracolumbar region: Secondary | ICD-10-CM | POA: Diagnosis not present

## 2024-06-15 DIAGNOSIS — S32009S Unspecified fracture of unspecified lumbar vertebra, sequela: Secondary | ICD-10-CM | POA: Diagnosis not present

## 2024-06-15 DIAGNOSIS — Z79891 Long term (current) use of opiate analgesic: Secondary | ICD-10-CM | POA: Diagnosis not present

## 2024-06-15 DIAGNOSIS — M5416 Radiculopathy, lumbar region: Secondary | ICD-10-CM | POA: Diagnosis not present

## 2024-06-15 DIAGNOSIS — G894 Chronic pain syndrome: Secondary | ICD-10-CM | POA: Diagnosis not present

## 2024-06-15 DIAGNOSIS — M47817 Spondylosis without myelopathy or radiculopathy, lumbosacral region: Secondary | ICD-10-CM | POA: Diagnosis not present

## 2024-06-16 ENCOUNTER — Other Ambulatory Visit: Payer: Self-pay | Admitting: Anesthesiology

## 2024-06-16 DIAGNOSIS — M5416 Radiculopathy, lumbar region: Secondary | ICD-10-CM

## 2024-06-28 ENCOUNTER — Ambulatory Visit: Payer: PPO | Admitting: Internal Medicine

## 2024-07-03 ENCOUNTER — Ambulatory Visit
Admission: RE | Admit: 2024-07-03 | Discharge: 2024-07-03 | Disposition: A | Discharge status: Home / Self Care | Source: Ambulatory Visit | Attending: Anesthesiology | Admitting: Anesthesiology

## 2024-07-03 DIAGNOSIS — M47816 Spondylosis without myelopathy or radiculopathy, lumbar region: Secondary | ICD-10-CM | POA: Diagnosis not present

## 2024-07-03 DIAGNOSIS — M5416 Radiculopathy, lumbar region: Secondary | ICD-10-CM

## 2024-07-03 DIAGNOSIS — M48061 Spinal stenosis, lumbar region without neurogenic claudication: Secondary | ICD-10-CM | POA: Diagnosis not present

## 2024-07-03 DIAGNOSIS — M4856XA Collapsed vertebra, not elsewhere classified, lumbar region, initial encounter for fracture: Secondary | ICD-10-CM | POA: Diagnosis not present

## 2024-07-03 MED ORDER — GADOPICLENOL 0.5 MMOL/ML IV SOLN
7.0000 mL | Freq: Once | INTRAVENOUS | Status: AC | PRN
  Administered 2024-07-03: 7 mL via INTRAVENOUS

## 2024-07-09 DIAGNOSIS — M25511 Pain in right shoulder: Secondary | ICD-10-CM | POA: Diagnosis not present

## 2024-07-19 DIAGNOSIS — S32009S Unspecified fracture of unspecified lumbar vertebra, sequela: Secondary | ICD-10-CM | POA: Diagnosis not present

## 2024-07-19 DIAGNOSIS — M5416 Radiculopathy, lumbar region: Secondary | ICD-10-CM | POA: Diagnosis not present

## 2024-07-19 DIAGNOSIS — M47817 Spondylosis without myelopathy or radiculopathy, lumbosacral region: Secondary | ICD-10-CM | POA: Diagnosis not present

## 2024-07-19 DIAGNOSIS — M4125 Other idiopathic scoliosis, thoracolumbar region: Secondary | ICD-10-CM | POA: Diagnosis not present

## 2024-08-13 DIAGNOSIS — M25511 Pain in right shoulder: Secondary | ICD-10-CM | POA: Diagnosis not present

## 2024-08-16 ENCOUNTER — Other Ambulatory Visit: Payer: Self-pay | Admitting: Orthopedic Surgery

## 2024-08-16 DIAGNOSIS — M25511 Pain in right shoulder: Secondary | ICD-10-CM

## 2024-08-24 DIAGNOSIS — N183 Chronic kidney disease, stage 3 unspecified: Secondary | ICD-10-CM | POA: Diagnosis not present

## 2024-08-24 DIAGNOSIS — J302 Other seasonal allergic rhinitis: Secondary | ICD-10-CM | POA: Diagnosis not present

## 2024-08-24 DIAGNOSIS — B009 Herpesviral infection, unspecified: Secondary | ICD-10-CM | POA: Diagnosis not present

## 2024-08-24 DIAGNOSIS — I129 Hypertensive chronic kidney disease with stage 1 through stage 4 chronic kidney disease, or unspecified chronic kidney disease: Secondary | ICD-10-CM | POA: Diagnosis not present

## 2024-08-24 DIAGNOSIS — L649 Androgenic alopecia, unspecified: Secondary | ICD-10-CM | POA: Diagnosis not present

## 2024-08-24 DIAGNOSIS — N1832 Chronic kidney disease, stage 3b: Secondary | ICD-10-CM | POA: Diagnosis not present

## 2024-08-24 DIAGNOSIS — E7800 Pure hypercholesterolemia, unspecified: Secondary | ICD-10-CM | POA: Diagnosis not present

## 2024-08-24 DIAGNOSIS — Z23 Encounter for immunization: Secondary | ICD-10-CM | POA: Diagnosis not present

## 2024-08-24 DIAGNOSIS — K219 Gastro-esophageal reflux disease without esophagitis: Secondary | ICD-10-CM | POA: Diagnosis not present

## 2024-08-24 DIAGNOSIS — M545 Low back pain, unspecified: Secondary | ICD-10-CM | POA: Diagnosis not present

## 2024-08-25 DIAGNOSIS — N183 Chronic kidney disease, stage 3 unspecified: Secondary | ICD-10-CM | POA: Diagnosis not present

## 2024-08-26 DIAGNOSIS — M5416 Radiculopathy, lumbar region: Secondary | ICD-10-CM | POA: Diagnosis not present

## 2024-08-30 ENCOUNTER — Ambulatory Visit
Admission: RE | Admit: 2024-08-30 | Discharge: 2024-08-30 | Disposition: A | Discharge status: Home / Self Care | Source: Ambulatory Visit | Attending: Orthopedic Surgery | Admitting: Orthopedic Surgery

## 2024-08-30 ENCOUNTER — Other Ambulatory Visit: Payer: Self-pay | Admitting: Neurological Surgery

## 2024-08-30 DIAGNOSIS — M25511 Pain in right shoulder: Secondary | ICD-10-CM

## 2024-08-30 DIAGNOSIS — M75121 Complete rotator cuff tear or rupture of right shoulder, not specified as traumatic: Secondary | ICD-10-CM | POA: Diagnosis not present

## 2024-08-30 DIAGNOSIS — M5416 Radiculopathy, lumbar region: Secondary | ICD-10-CM

## 2024-09-14 DIAGNOSIS — M5416 Radiculopathy, lumbar region: Secondary | ICD-10-CM | POA: Diagnosis not present

## 2024-09-14 NOTE — Discharge Instructions (Signed)

## 2024-09-15 ENCOUNTER — Telehealth: Payer: Self-pay

## 2024-09-15 DIAGNOSIS — M25511 Pain in right shoulder: Secondary | ICD-10-CM | POA: Diagnosis not present

## 2024-09-16 ENCOUNTER — Ambulatory Visit
Admission: RE | Admit: 2024-09-16 | Discharge: 2024-09-16 | Disposition: A | Discharge status: Home / Self Care | Source: Ambulatory Visit | Attending: Neurological Surgery | Admitting: Neurological Surgery

## 2024-09-16 DIAGNOSIS — M47816 Spondylosis without myelopathy or radiculopathy, lumbar region: Secondary | ICD-10-CM | POA: Diagnosis not present

## 2024-09-16 DIAGNOSIS — M5416 Radiculopathy, lumbar region: Secondary | ICD-10-CM

## 2024-09-16 DIAGNOSIS — M48061 Spinal stenosis, lumbar region without neurogenic claudication: Secondary | ICD-10-CM | POA: Diagnosis not present

## 2024-09-16 MED ORDER — DIAZEPAM 5 MG PO TABS
5.0000 mg | ORAL_TABLET | Freq: Once | ORAL | Status: AC
  Administered 2024-09-16: 5 mg via ORAL

## 2024-09-16 MED ORDER — IOPAMIDOL (ISOVUE-M 200) INJECTION 41%
18.0000 mL | Freq: Once | INTRAMUSCULAR | Status: AC
Start: 2024-09-16 — End: 2024-09-16
  Administered 2024-09-16: 18 mL via INTRATHECAL

## 2024-09-16 MED ORDER — MEPERIDINE HCL 50 MG/ML IJ SOLN: 50.0000 mg | Freq: Once | INTRAMUSCULAR | Status: DC | PRN

## 2024-09-16 MED ORDER — ONDANSETRON HCL 4 MG/2ML IJ SOLN: 4.0000 mg | Freq: Once | INTRAMUSCULAR | Status: DC | PRN

## 2024-09-29 ENCOUNTER — Ambulatory Visit: Payer: PPO | Admitting: Nurse Practitioner

## 2024-10-07 DIAGNOSIS — M25551 Pain in right hip: Secondary | ICD-10-CM | POA: Diagnosis not present

## 2024-10-07 DIAGNOSIS — Z96642 Presence of left artificial hip joint: Secondary | ICD-10-CM | POA: Diagnosis not present

## 2024-10-12 DIAGNOSIS — M5417 Radiculopathy, lumbosacral region: Secondary | ICD-10-CM | POA: Diagnosis not present

## 2024-10-13 ENCOUNTER — Other Ambulatory Visit: Payer: Self-pay | Admitting: Neurological Surgery

## 2024-10-18 DIAGNOSIS — S32009S Unspecified fracture of unspecified lumbar vertebra, sequela: Secondary | ICD-10-CM | POA: Diagnosis not present

## 2024-10-18 DIAGNOSIS — M47817 Spondylosis without myelopathy or radiculopathy, lumbosacral region: Secondary | ICD-10-CM | POA: Diagnosis not present

## 2024-10-18 DIAGNOSIS — M4125 Other idiopathic scoliosis, thoracolumbar region: Secondary | ICD-10-CM | POA: Diagnosis not present

## 2024-10-18 DIAGNOSIS — M5416 Radiculopathy, lumbar region: Secondary | ICD-10-CM | POA: Diagnosis not present

## 2024-10-28 DIAGNOSIS — M7062 Trochanteric bursitis, left hip: Secondary | ICD-10-CM | POA: Diagnosis not present

## 2024-10-28 DIAGNOSIS — Z96642 Presence of left artificial hip joint: Secondary | ICD-10-CM | POA: Diagnosis not present

## 2024-11-01 NOTE — Pre-Procedure Instructions (Signed)
 Surgical Instructions   Your procedure is scheduled on November 10, 2024. Report to [facility] Main Entrance A at 6:30 A.M., then check in with the Admitting office. Any questions or running late day of surgery: call [phone]  Questions prior to your surgery date: call [phone], Monday-Friday, 8am-4pm. If you experience any cold or flu symptoms such as cough, fever, chills, shortness of breath, etc. between now and your scheduled surgery, please notify us  at the above number.     Remember:  Do not eat or drink after midnight the night before your surgery    Take these medicines the morning of surgery with A SIP OF WATER: atenolol  (TENORMIN )  atorvastatin  (LIPITOR)  cetirizine (ZYRTEC)  dexamethasone  (DECADRON )  esomeprazole  (NEXIUM )  finasteride  (PROSCAR )  MIEBO  RESTASIS  ophthalmic emulsion    May take these medicines IF NEEDED: acetaminophen  (TYLENOL )  acyclovir  (ZOVIRAX )  PERCOCET  Polyethyl Glycol-Propyl Glycol (SYSTANE ULTRA OP) eye drops Polyethylene Glycol 400 (BLINK TRIPLE CARE) eye drops   One week prior to surgery, STOP taking any Aspirin  (unless otherwise instructed by your surgeon) Aleve, Naproxen, Ibuprofen, Motrin, Advil, Goody's, BC's, all herbal medications, fish oil, and non-prescription vitamins.                     Do NOT Smoke (Tobacco/Vaping) for 24 hours prior to your procedure.  If you use a CPAP at night, you may bring your mask/headgear for your overnight stay.   You will be asked to remove any contacts, glasses, piercing's, hearing aid's, dentures/partials prior to surgery. Please bring cases for these items if needed.    Patients discharged the day of surgery will not be allowed to drive home, and someone needs to stay with them for 24 hours.  SURGICAL WAITING ROOM VISITATION Patients may have no more than 2 support people in the waiting area - these visitors may rotate.   Pre-op nurse will coordinate an appropriate time for 1 ADULT  support person, who may not rotate, to accompany patient in pre-op.  Children under the age of 32 must have an adult with them who is not the patient and must remain in the main waiting area with an adult.  If the patient needs to stay at the hospital during part of their recovery, the visitor guidelines for inpatient rooms apply.  Please refer to the [facility] website for the visitor guidelines for any additional information.   If you received a COVID test during your pre-op visit  it is requested that you wear a mask when out in public, stay away from anyone that may not be feeling well and notify your surgeon if you develop symptoms. If you have been in contact with anyone that has tested positive in the last 10 days please notify you surgeon.      Pre-operative 4 CHG Bathing Instructions   You can play a key role in reducing the risk of infection after surgery. Your skin needs to be as free of germs as possible. You can reduce the number of germs on your skin by washing with CHG (chlorhexidine  gluconate) soap before surgery. CHG is an antiseptic soap that kills germs and continues to kill germs even after washing.   DO NOT use if you have an allergy to chlorhexidine /CHG or antibacterial soaps. If your skin becomes reddened or irritated, stop using the CHG and notify one of our RNs at [phone].   Please shower with the CHG soap starting 4 days before surgery using the  following schedule:     Please keep in mind the following:  DO NOT shave, including legs and underarms, starting the day of your first shower.   You may shave your face at any point before/day of surgery.  Place clean sheets on your bed the day you start using CHG soap. Use a clean washcloth (not used since being washed) for each shower. DO NOT sleep with pets once you start using the CHG.   CHG Shower Instructions:  Wash your face and private area with normal soap. If you choose to wash your hair, wash first  with your normal shampoo.  After you use shampoo/soap, rinse your hair and body thoroughly to remove shampoo/soap residue.  Turn the water OFF and apply  bottle of CHG soap to a CLEAN washcloth.  Apply CHG soap ONLY FROM YOUR NECK DOWN TO YOUR TOES (washing for 3-5 minutes)  DO NOT use CHG soap on face, private areas, open wounds, or sores.  Pay special attention to the area where your surgery is being performed.  If you are having back surgery, having someone wash your back for you may be helpful. Wait 2 minutes after CHG soap is applied, then you may rinse off the CHG soap.  Pat dry with a clean towel  Put on clean clothes/pajamas   If you choose to wear lotion, please use ONLY the CHG-compatible lotions that are listed below.  Additional instructions for the day of surgery:  If you choose, you may shower the morning of surgery with an antibacterial soap.  DO NOT APPLY any lotions, deodorants, cologne, or perfumes.   Do not bring valuables to the hospital. [facility] is not responsible for any belongings/valuables. Do not wear nail polish, gel polish, artificial nails, or any other type of covering on natural nails (fingers and toes) Do not wear jewelry or makeup Put on clean/comfortable clothes.  Please brush your teeth.  Ask your nurse before applying any prescription medications to the skin.     CHG Compatible Lotions   Aveeno Moisturizing lotion  Cetaphil Moisturizing Cream  Cetaphil Moisturizing Lotion  Clairol Herbal Essence Moisturizing Lotion, Dry Skin  Clairol Herbal Essence Moisturizing Lotion, Extra Dry Skin  Clairol Herbal Essence Moisturizing Lotion, Normal Skin  Curel Age Defying Therapeutic Moisturizing Lotion with Alpha Hydroxy  Curel Extreme Care Body Lotion  Curel Soothing Hands Moisturizing Hand Lotion  Curel Therapeutic Moisturizing Cream, Fragrance-Free  Curel Therapeutic Moisturizing Lotion, Fragrance-Free  Curel Therapeutic Moisturizing Lotion,  Original Formula  Eucerin Daily Replenishing Lotion  Eucerin Dry Skin Therapy Plus Alpha Hydroxy Crme  Eucerin Dry Skin Therapy Plus Alpha Hydroxy Lotion  Eucerin Original Crme  Eucerin Original Lotion  Eucerin Plus Crme Eucerin Plus Lotion  Eucerin TriLipid Replenishing Lotion  Keri Anti-Bacterial Hand Lotion  Keri Deep Conditioning Original Lotion Dry Skin Formula Softly Scented  Keri Deep Conditioning Original Lotion, Fragrance Free Sensitive Skin Formula  Keri Lotion Fast Absorbing Fragrance Free Sensitive Skin Formula  Keri Lotion Fast Absorbing Softly Scented Dry Skin Formula  Keri Original Lotion  Keri Skin Renewal Lotion Keri Silky Smooth Lotion  Keri Silky Smooth Sensitive Skin Lotion  Nivea Body Creamy Conditioning Oil  Nivea Body Extra Enriched Teacher, Adult Education Moisturizing Lotion Nivea Crme  Nivea Skin Firming Lotion  NutraDerm 30 Skin Lotion  NutraDerm Skin Lotion  NutraDerm Therapeutic Skin Cream  NutraDerm Therapeutic Skin Lotion  ProShield Protective Hand Cream  Provon moisturizing lotion  Please read  over the following fact sheets that you were given.

## 2024-11-02 ENCOUNTER — Encounter (HOSPITAL_COMMUNITY): Payer: Self-pay

## 2024-11-02 ENCOUNTER — Encounter (HOSPITAL_COMMUNITY)
Admission: RE | Admit: 2024-11-02 | Discharge: 2024-11-02 | Disposition: A | Discharge status: Home / Self Care | Source: Ambulatory Visit | Attending: Neurological Surgery | Admitting: Neurological Surgery

## 2024-11-02 ENCOUNTER — Other Ambulatory Visit: Payer: Self-pay

## 2024-11-02 VITALS — BP 143/69 | HR 57 | Temp 97.6°F | Resp 18 | Ht 67.0 in | Wt 148.0 lb

## 2024-11-02 DIAGNOSIS — I10 Essential (primary) hypertension: Secondary | ICD-10-CM | POA: Diagnosis not present

## 2024-11-02 DIAGNOSIS — Z01818 Encounter for other preprocedural examination: Secondary | ICD-10-CM

## 2024-11-02 DIAGNOSIS — Z01812 Encounter for preprocedural laboratory examination: Secondary | ICD-10-CM | POA: Insufficient documentation

## 2024-11-02 DIAGNOSIS — M5417 Radiculopathy, lumbosacral region: Secondary | ICD-10-CM | POA: Diagnosis not present

## 2024-11-02 HISTORY — DX: Chronic kidney disease, unspecified: N18.9

## 2024-11-02 LAB — COMPREHENSIVE METABOLIC PANEL WITH GFR
ALT: 36 U/L (ref 0–44)
AST: 33 U/L (ref 15–41)
Albumin: 3.5 g/dL (ref 3.5–5.0)
Alkaline Phosphatase: 62 U/L (ref 38–126)
Anion gap: 10 (ref 5–15)
BUN: 20 mg/dL (ref 8–23)
CO2: 27 mmol/L (ref 22–32)
Calcium: 9.1 mg/dL (ref 8.9–10.3)
Chloride: 97 mmol/L — ABNORMAL LOW (ref 98–111)
Creatinine, Ser: 1.13 mg/dL (ref 0.61–1.24)
GFR, Estimated: >60 mL/min (ref >60)
Glucose, Bld: 90 mg/dL (ref 70–99)
Potassium: 3.5 mmol/L (ref 3.5–5.1)
Sodium: 134 mmol/L — ABNORMAL LOW (ref 135–145)
Total Bilirubin: 1 mg/dL (ref 0.0–1.2)
Total Protein: 6.3 g/dL — ABNORMAL LOW (ref 6.5–8.1)

## 2024-11-02 LAB — TYPE AND SCREEN
ABO/RH(D): A NEG
Antibody Screen: NEGATIVE

## 2024-11-02 LAB — CBC
HCT: 45.5 % (ref 39.0–52.0)
Hemoglobin: 16 g/dL (ref 13.0–17.0)
MCH: 34.7 pg — ABNORMAL HIGH (ref 26.0–34.0)
MCHC: 35.2 g/dL (ref 30.0–36.0)
MCV: 98.7 fL (ref 80.0–100.0)
Platelets: 200 K/uL (ref 150–400)
RBC: 4.61 MIL/uL (ref 4.22–5.81)
RDW: 13 % (ref 11.5–15.5)
WBC: 10.4 K/uL (ref 4.0–10.5)
nRBC: 0.2 % (ref 0.0–0.2)

## 2024-11-02 LAB — SURGICAL PCR SCREEN

## 2024-11-02 NOTE — Progress Notes (Signed)
 PCP - Vernadine Ade, MD Cardiologist - denies Nephrologist - Dolan Casco, MD  PPM/ICD - denies Device Orders - n/a Rep Notified - n/a  Chest x-ray - denies EKG - 12/17/2023 Stress Test - denies ECHO - denies Cardiac Cath - denies  Sleep Study - denies CPAP - n/a  Fasting Blood Sugar - no DM Checks Blood Sugar _____ times a day  Last dose of GLP1 agonist-  n/a GLP1 instructions: n/a  Blood Thinner Instructions: n/a Aspirin  Instructions: n/a  ERAS Protcol - NPO PRE-SURGERY Ensure or G2-   COVID TEST- n/a    Anesthesia review: yes, hx of HTN, CKD stage III  Patient denies shortness of breath, fever, cough and chest pain at PAT appointment   All instructions explained to the patient, with a verbal understanding of the material. Patient agrees to go over the instructions while at home for a better understanding. Patient also instructed to self quarantine after being tested for COVID-19. The opportunity to ask questions was provided.

## 2024-11-03 NOTE — Progress Notes (Signed)
 Surgical PCR result invalid. Will need to be recollected DOS. Order placed.

## 2024-11-09 NOTE — Anesthesia Preprocedure Evaluation (Signed)
 Anesthesia Evaluation  Patient identified by MRN, date of birth, ID band Patient awake    Reviewed: Allergy & Precautions, NPO status , Patient's Chart, lab work & pertinent test results, reviewed documented beta blocker date and time   History of Anesthesia Complications Negative for: history of anesthetic complications  Airway Mallampati: II  TM Distance: >3 FB Neck ROM: Full    Dental  (+) Dental Advisory Given   Pulmonary COPD,  COPD inhaler   breath sounds clear to auscultation       Cardiovascular hypertension, Pt. on medications and Pt. on home beta blockers  Rhythm:Regular Rate:Normal     Neuro/Psych  PSYCHIATRIC DISORDERS (ADD)      Back pain    GI/Hepatic Neg liver ROS,GERD  Medicated and Controlled,,  Endo/Other  negative endocrine ROS    Renal/GU Renal InsufficiencyRenal disease     Musculoskeletal   Abdominal   Peds  Hematology Hb 16.0, plt 200k   Anesthesia Other Findings   Reproductive/Obstetrics                              Anesthesia Physical Anesthesia Plan  ASA: 2  Anesthesia Plan: General   Post-op Pain Management: Tylenol  PO (pre-op)*   Induction: Intravenous  PONV Risk Score and Plan: 2 and Ondansetron  and Dexamethasone   Airway Management Planned: Oral ETT  Additional Equipment: None  Intra-op Plan:   Post-operative Plan: Extubation in OR  Informed Consent: I have reviewed the patients History and Physical, chart, labs and discussed the procedure including the risks, benefits and alternatives for the proposed anesthesia with the patient or authorized representative who has indicated his/her understanding and acceptance.     Dental advisory given  Plan Discussed with: CRNA and Surgeon  Anesthesia Plan Comments:          Anesthesia Quick Evaluation

## 2024-11-10 ENCOUNTER — Ambulatory Visit (HOSPITAL_COMMUNITY): Payer: Self-pay | Admitting: Physician Assistant

## 2024-11-10 ENCOUNTER — Encounter (HOSPITAL_COMMUNITY): Admission: RE | Disposition: A | Payer: Self-pay | Source: Ambulatory Visit | Attending: Neurological Surgery

## 2024-11-10 ENCOUNTER — Ambulatory Visit (HOSPITAL_COMMUNITY)

## 2024-11-10 ENCOUNTER — Observation Stay (HOSPITAL_COMMUNITY)
Admission: RE | Admit: 2024-11-10 | Discharge: 2024-11-11 | Disposition: A | Source: Ambulatory Visit | Attending: Neurological Surgery | Admitting: Neurological Surgery

## 2024-11-10 ENCOUNTER — Ambulatory Visit (HOSPITAL_COMMUNITY): Payer: Self-pay | Admitting: Anesthesiology

## 2024-11-10 ENCOUNTER — Encounter (HOSPITAL_COMMUNITY): Payer: Self-pay | Admitting: Neurological Surgery

## 2024-11-10 DIAGNOSIS — I129 Hypertensive chronic kidney disease with stage 1 through stage 4 chronic kidney disease, or unspecified chronic kidney disease: Secondary | ICD-10-CM | POA: Insufficient documentation

## 2024-11-10 DIAGNOSIS — M5416 Radiculopathy, lumbar region: Secondary | ICD-10-CM

## 2024-11-10 DIAGNOSIS — J449 Chronic obstructive pulmonary disease, unspecified: Secondary | ICD-10-CM | POA: Diagnosis not present

## 2024-11-10 DIAGNOSIS — Z96642 Presence of left artificial hip joint: Secondary | ICD-10-CM | POA: Diagnosis not present

## 2024-11-10 DIAGNOSIS — Z79899 Other long term (current) drug therapy: Secondary | ICD-10-CM | POA: Insufficient documentation

## 2024-11-10 DIAGNOSIS — Z981 Arthrodesis status: Secondary | ICD-10-CM

## 2024-11-10 DIAGNOSIS — M48061 Spinal stenosis, lumbar region without neurogenic claudication: Secondary | ICD-10-CM | POA: Diagnosis not present

## 2024-11-10 DIAGNOSIS — M5417 Radiculopathy, lumbosacral region: Secondary | ICD-10-CM | POA: Diagnosis not present

## 2024-11-10 DIAGNOSIS — M4317 Spondylolisthesis, lumbosacral region: Secondary | ICD-10-CM | POA: Diagnosis not present

## 2024-11-10 DIAGNOSIS — M4807 Spinal stenosis, lumbosacral region: Secondary | ICD-10-CM | POA: Diagnosis not present

## 2024-11-10 DIAGNOSIS — N183 Chronic kidney disease, stage 3 unspecified: Secondary | ICD-10-CM | POA: Insufficient documentation

## 2024-11-10 DIAGNOSIS — Z01818 Encounter for other preprocedural examination: Principal | ICD-10-CM

## 2024-11-10 LAB — SURGICAL PCR SCREEN
MRSA, PCR: NEGATIVE
Staphylococcus aureus: NEGATIVE

## 2024-11-10 LAB — ABO/RH: ABO/RH(D): A NEG

## 2024-11-10 SURGERY — POSTERIOR LUMBAR FUSION 1 LEVEL
Anesthesia: General | Site: Back

## 2024-11-10 MED ORDER — CYCLOSPORINE 0.05 % OP EMUL
1.0000 [drp] | Freq: Two times a day (BID) | OPHTHALMIC | Status: DC
  Administered 2024-11-10 – 2024-11-11 (×2): 1 [drp] via OPHTHALMIC
  Filled 2024-11-10 (×2): qty 1

## 2024-11-10 MED ORDER — ROCURONIUM BROMIDE 10 MG/ML (PF) SYRINGE
PREFILLED_SYRINGE | INTRAVENOUS | Status: AC
  Filled 2024-11-10: qty 10

## 2024-11-10 MED ORDER — HYDROMORPHONE HCL 1 MG/ML IJ SOLN
0.2500 mg | INTRAMUSCULAR | Status: DC | PRN
  Administered 2024-11-10 (×2): 0.5 mg via INTRAVENOUS

## 2024-11-10 MED ORDER — FENTANYL CITRATE (PF) 250 MCG/5ML IJ SOLN
INTRAMUSCULAR | Status: DC | PRN
  Administered 2024-11-10 (×2): 50 ug via INTRAVENOUS
  Administered 2024-11-10: 100 ug via INTRAVENOUS
  Administered 2024-11-10: 50 ug via INTRAVENOUS

## 2024-11-10 MED ORDER — HYDROMORPHONE HCL 1 MG/ML IJ SOLN
INTRAMUSCULAR | Status: AC
  Filled 2024-11-10: qty 0.5

## 2024-11-10 MED ORDER — THROMBIN 20000 UNITS EX SOLR
CUTANEOUS | Status: AC
  Filled 2024-11-10: qty 20000

## 2024-11-10 MED ORDER — OXYCODONE HCL 5 MG PO TABS: 5.0000 mg | ORAL_TABLET | Freq: Once | ORAL | Status: DC | PRN

## 2024-11-10 MED ORDER — GABAPENTIN 300 MG PO CAPS
300.0000 mg | ORAL_CAPSULE | ORAL | Status: DC
  Filled 2024-11-10: qty 1

## 2024-11-10 MED ORDER — SODIUM CHLORIDE 0.9 % IV SOLN
250.0000 mL | INTRAVENOUS | Status: DC
  Administered 2024-11-10: 250 mL via INTRAVENOUS

## 2024-11-10 MED ORDER — LIDOCAINE 2% (20 MG/ML) 5 ML SYRINGE
INTRAMUSCULAR | Status: AC
  Filled 2024-11-10: qty 5

## 2024-11-10 MED ORDER — ONDANSETRON HCL 4 MG PO TABS: 4.0000 mg | ORAL_TABLET | Freq: Four times a day (QID) | ORAL | Status: DC | PRN

## 2024-11-10 MED ORDER — CHLORHEXIDINE GLUCONATE CLOTH 2 % EX PADS: 6.0000 | MEDICATED_PAD | Freq: Once | CUTANEOUS | Status: DC

## 2024-11-10 MED ORDER — MIDAZOLAM HCL (PF) 2 MG/2ML IJ SOLN: 0.5000 mg | Freq: Once | INTRAMUSCULAR | Status: DC | PRN

## 2024-11-10 MED ORDER — PROPOFOL 10 MG/ML IV BOLUS
INTRAVENOUS | Status: DC | PRN
  Administered 2024-11-10: 200 mg via INTRAVENOUS

## 2024-11-10 MED ORDER — ACETAMINOPHEN 500 MG PO TABS
1000.0000 mg | ORAL_TABLET | Freq: Four times a day (QID) | ORAL | Status: AC
  Administered 2024-11-10 – 2024-11-11 (×4): 1000 mg via ORAL
  Filled 2024-11-10 (×4): qty 2

## 2024-11-10 MED ORDER — DEXAMETHASONE SODIUM PHOSPHATE 4 MG/ML IJ SOLN
4.0000 mg | Freq: Four times a day (QID) | INTRAMUSCULAR | Status: DC
  Administered 2024-11-10 (×2): 4 mg via INTRAVENOUS
  Filled 2024-11-10 (×2): qty 1

## 2024-11-10 MED ORDER — OXYCODONE HCL 5 MG PO TABS
5.0000 mg | ORAL_TABLET | ORAL | Status: DC | PRN
  Administered 2024-11-10 (×2): 5 mg via ORAL
  Filled 2024-11-10 (×2): qty 1

## 2024-11-10 MED ORDER — HYDROMORPHONE HCL 1 MG/ML IJ SOLN
0.5000 mg | INTRAMUSCULAR | Status: DC | PRN
  Administered 2024-11-10 (×2): 0.5 mg via INTRAVENOUS
  Filled 2024-11-10 (×2): qty 0.5

## 2024-11-10 MED ORDER — CEFAZOLIN SODIUM-DEXTROSE 2-4 GM/100ML-% IV SOLN
2.0000 g | INTRAVENOUS | Status: AC
  Administered 2024-11-10: 2 g via INTRAVENOUS
  Filled 2024-11-10: qty 100

## 2024-11-10 MED ORDER — ONDANSETRON HCL 4 MG/2ML IJ SOLN
INTRAMUSCULAR | Status: AC
  Filled 2024-11-10: qty 2

## 2024-11-10 MED ORDER — ATENOLOL 50 MG PO TABS
50.0000 mg | ORAL_TABLET | Freq: Every day | ORAL | Status: DC
  Administered 2024-11-11: 50 mg via ORAL
  Filled 2024-11-10: qty 1

## 2024-11-10 MED ORDER — HYDROMORPHONE HCL 1 MG/ML IJ SOLN
INTRAMUSCULAR | Status: DC | PRN
  Administered 2024-11-10 (×2): .5 mg via INTRAVENOUS

## 2024-11-10 MED ORDER — ZINC SULFATE 220 (50 ZN) MG PO CAPS
220.0000 mg | ORAL_CAPSULE | Freq: Every day | ORAL | Status: DC
  Administered 2024-11-11: 220 mg via ORAL
  Filled 2024-11-10: qty 1

## 2024-11-10 MED ORDER — PROPOFOL 10 MG/ML IV BOLUS
INTRAVENOUS | Status: AC
  Filled 2024-11-10: qty 20

## 2024-11-10 MED ORDER — PERFLUOROHEXYLOCTANE 1.338 GM/ML OP SOLN: 1.0000 [drp] | Freq: Every day | OPHTHALMIC | Status: DC

## 2024-11-10 MED ORDER — HYDROMORPHONE HCL 1 MG/ML IJ SOLN
INTRAMUSCULAR | Status: AC
  Filled 2024-11-10: qty 1

## 2024-11-10 MED ORDER — MEPERIDINE HCL 25 MG/ML IJ SOLN: 6.2500 mg | INTRAMUSCULAR | Status: DC | PRN

## 2024-11-10 MED ORDER — SUGAMMADEX SODIUM 200 MG/2ML IV SOLN
INTRAVENOUS | Status: DC | PRN
  Administered 2024-11-10: 150 mg via INTRAVENOUS

## 2024-11-10 MED ORDER — LIDOCAINE 2% (20 MG/ML) 5 ML SYRINGE
INTRAMUSCULAR | Status: DC | PRN
  Administered 2024-11-10: 40 mg via INTRAVENOUS

## 2024-11-10 MED ORDER — BUPIVACAINE HCL (PF) 0.25 % IJ SOLN
INTRAMUSCULAR | Status: AC
  Filled 2024-11-10: qty 30

## 2024-11-10 MED ORDER — ORAL CARE MOUTH RINSE: 15.0000 mL | Freq: Once | OROMUCOSAL | Status: AC

## 2024-11-10 MED ORDER — EPHEDRINE 5 MG/ML INJ
INTRAVENOUS | Status: AC
  Filled 2024-11-10: qty 10

## 2024-11-10 MED ORDER — METHOCARBAMOL 500 MG PO TABS
500.0000 mg | ORAL_TABLET | Freq: Four times a day (QID) | ORAL | Status: DC | PRN
  Administered 2024-11-10 – 2024-11-11 (×3): 500 mg via ORAL
  Filled 2024-11-10 (×3): qty 1

## 2024-11-10 MED ORDER — MENTHOL 3 MG MT LOZG: 1.0000 | LOZENGE | OROMUCOSAL | Status: DC | PRN

## 2024-11-10 MED ORDER — METHOCARBAMOL 1000 MG/10ML IJ SOLN: 500.0000 mg | Freq: Four times a day (QID) | INTRAMUSCULAR | Status: DC | PRN

## 2024-11-10 MED ORDER — OXYCODONE HCL 5 MG/5ML PO SOLN: 5.0000 mg | Freq: Once | ORAL | Status: DC | PRN

## 2024-11-10 MED ORDER — PANTOPRAZOLE SODIUM 40 MG PO TBEC
40.0000 mg | DELAYED_RELEASE_TABLET | Freq: Every day | ORAL | Status: DC
  Administered 2024-11-10 – 2024-11-11 (×2): 40 mg via ORAL
  Filled 2024-11-10 (×2): qty 1

## 2024-11-10 MED ORDER — SURGIRINSE WOUND IRRIGATION SYSTEM - OPTIME
TOPICAL | Status: DC | PRN
  Administered 2024-11-10: 450 mL via TOPICAL

## 2024-11-10 MED ORDER — THROMBIN 20000 UNITS EX SOLR
CUTANEOUS | Status: DC | PRN
  Administered 2024-11-10: 20 mL via TOPICAL

## 2024-11-10 MED ORDER — THROMBIN 5000 UNITS EX KIT
PACK | CUTANEOUS | Status: AC
  Filled 2024-11-10: qty 1

## 2024-11-10 MED ORDER — PHENOL 1.4 % MT LIQD: 1.0000 | OROMUCOSAL | Status: DC | PRN

## 2024-11-10 MED ORDER — OXYCODONE HCL 5 MG PO TABS
10.0000 mg | ORAL_TABLET | ORAL | Status: DC | PRN
  Administered 2024-11-11 (×2): 10 mg via ORAL
  Filled 2024-11-10 (×2): qty 2

## 2024-11-10 MED ORDER — 0.9 % SODIUM CHLORIDE (POUR BTL) OPTIME
TOPICAL | Status: DC | PRN
  Administered 2024-11-10: 1000 mL

## 2024-11-10 MED ORDER — DEXAMETHASONE SOD PHOSPHATE PF 10 MG/ML IJ SOLN
INTRAMUSCULAR | Status: DC | PRN
  Administered 2024-11-10: 10 mg via INTRAVENOUS

## 2024-11-10 MED ORDER — SENNA 8.6 MG PO TABS
1.0000 | ORAL_TABLET | Freq: Two times a day (BID) | ORAL | Status: DC
  Administered 2024-11-10 – 2024-11-11 (×2): 8.6 mg via ORAL
  Filled 2024-11-10 (×2): qty 1

## 2024-11-10 MED ORDER — LACTATED RINGERS IV SOLN: INTRAVENOUS | Status: DC

## 2024-11-10 MED ORDER — EPHEDRINE SULFATE-NACL 50-0.9 MG/10ML-% IV SOSY
PREFILLED_SYRINGE | INTRAVENOUS | Status: DC | PRN
  Administered 2024-11-10 (×3): 5 mg via INTRAVENOUS
  Administered 2024-11-10: 15 mg via INTRAVENOUS

## 2024-11-10 MED ORDER — ONDANSETRON HCL 4 MG/2ML IJ SOLN
INTRAMUSCULAR | Status: DC | PRN
  Administered 2024-11-10: 4 mg via INTRAVENOUS

## 2024-11-10 MED ORDER — FENTANYL CITRATE (PF) 250 MCG/5ML IJ SOLN
INTRAMUSCULAR | Status: AC
  Filled 2024-11-10: qty 5

## 2024-11-10 MED ORDER — BUPIVACAINE HCL (PF) 0.25 % IJ SOLN
INTRAMUSCULAR | Status: DC | PRN
  Administered 2024-11-10: 7 mL

## 2024-11-10 MED ORDER — ACETAMINOPHEN 500 MG PO TABS
1000.0000 mg | ORAL_TABLET | ORAL | Status: AC
  Administered 2024-11-10: 1000 mg via ORAL

## 2024-11-10 MED ORDER — CEFAZOLIN SODIUM-DEXTROSE 2-4 GM/100ML-% IV SOLN
2.0000 g | Freq: Three times a day (TID) | INTRAVENOUS | Status: AC
  Administered 2024-11-10 – 2024-11-11 (×2): 2 g via INTRAVENOUS
  Filled 2024-11-10 (×2): qty 100

## 2024-11-10 MED ORDER — MIDAZOLAM HCL (PF) 2 MG/2ML IJ SOLN
INTRAMUSCULAR | Status: DC | PRN
  Administered 2024-11-10: 2 mg via INTRAVENOUS

## 2024-11-10 MED ORDER — ACETAMINOPHEN 500 MG PO TABS
1000.0000 mg | ORAL_TABLET | Freq: Once | ORAL | Status: AC
  Filled 2024-11-10: qty 2

## 2024-11-10 MED ORDER — DEXAMETHASONE 4 MG PO TABS
4.0000 mg | ORAL_TABLET | Freq: Four times a day (QID) | ORAL | Status: DC
  Administered 2024-11-10 – 2024-11-11 (×2): 4 mg via ORAL
  Filled 2024-11-10 (×2): qty 1

## 2024-11-10 MED ORDER — ROCURONIUM BROMIDE 10 MG/ML (PF) SYRINGE
PREFILLED_SYRINGE | INTRAVENOUS | Status: DC | PRN
  Administered 2024-11-10: 20 mg via INTRAVENOUS
  Administered 2024-11-10: 10 mg via INTRAVENOUS
  Administered 2024-11-10: 20 mg via INTRAVENOUS

## 2024-11-10 MED ORDER — CHLORHEXIDINE GLUCONATE 0.12 % MT SOLN
15.0000 mL | Freq: Once | OROMUCOSAL | Status: AC
  Administered 2024-11-10: 15 mL via OROMUCOSAL
  Filled 2024-11-10: qty 15

## 2024-11-10 MED ORDER — THROMBIN 5000 UNITS EX SOLR
OROMUCOSAL | Status: DC | PRN
  Administered 2024-11-10: 5 mL via TOPICAL

## 2024-11-10 MED ORDER — MIDAZOLAM HCL 2 MG/2ML IJ SOLN
INTRAMUSCULAR | Status: AC
  Filled 2024-11-10: qty 2

## 2024-11-10 MED ORDER — SUCCINYLCHOLINE CHLORIDE 200 MG/10ML IV SOSY
PREFILLED_SYRINGE | INTRAVENOUS | Status: DC | PRN
  Administered 2024-11-10: 160 mg via INTRAVENOUS

## 2024-11-10 MED ORDER — ONDANSETRON HCL 4 MG/2ML IJ SOLN: 4.0000 mg | Freq: Four times a day (QID) | INTRAMUSCULAR | Status: DC | PRN

## 2024-11-10 MED ORDER — SODIUM CHLORIDE 0.9% FLUSH: 3.0000 mL | INTRAVENOUS | Status: DC | PRN

## 2024-11-10 MED ORDER — POTASSIUM CHLORIDE IN NACL 20-0.9 MEQ/L-% IV SOLN
INTRAVENOUS | Status: DC
  Filled 2024-11-10: qty 1000

## 2024-11-10 MED ORDER — LOSARTAN POTASSIUM 50 MG PO TABS
50.0000 mg | ORAL_TABLET | Freq: Every day | ORAL | Status: DC
  Filled 2024-11-10: qty 1

## 2024-11-10 MED ORDER — SODIUM CHLORIDE 0.9% FLUSH
3.0000 mL | Freq: Two times a day (BID) | INTRAVENOUS | Status: DC
  Administered 2024-11-10 (×2): 3 mL via INTRAVENOUS

## 2024-11-10 SURGICAL SUPPLY — 55 items
ALLOGRAFT BONE FIBER KORE 5 (Bone Implant) IMPLANT
BAG COUNTER SPONGE SURGICOUNT (BAG) ×2 IMPLANT
BASKET BONE COLLECTION (BASKET) ×2 IMPLANT
BENZOIN TINCTURE PRP APPL 2/3 (GAUZE/BANDAGES/DRESSINGS) ×2 IMPLANT
BLADE BONE MILL MEDIUM (MISCELLANEOUS) ×2 IMPLANT
BLADE CLIPPER SURG (BLADE) IMPLANT
BUR CARBIDE MATCH 3.0 (BURR) ×2 IMPLANT
CANISTER SUCTION 3000ML PPV (SUCTIONS) ×2 IMPLANT
CLIP SPRING STIM LLIF SAFEOP (CLIP) IMPLANT
CNTNR URN SCR LID CUP LEK RST (MISCELLANEOUS) ×2 IMPLANT
COVER BACK TABLE 60X90IN (DRAPES) ×2 IMPLANT
DERMABOND ADVANCED .7 DNX12 (GAUZE/BANDAGES/DRESSINGS) IMPLANT
DRAPE C-ARM 42X72 X-RAY (DRAPES) ×4 IMPLANT
DRAPE C-ARMOR (DRAPES) ×2 IMPLANT
DRAPE LAPAROTOMY 100X72X124 (DRAPES) ×2 IMPLANT
DRAPE SURG 17X23 STRL (DRAPES) ×2 IMPLANT
DRSG AQUACEL AG ADV 3.5X 6 (GAUZE/BANDAGES/DRESSINGS) ×4 IMPLANT
DURAPREP 26ML APPLICATOR (WOUND CARE) ×2 IMPLANT
ELECTRODE KT SAFEOP SSEP/SURF (KITS) IMPLANT
ELECTRODE REM PT RTRN 9FT ADLT (ELECTROSURGICAL) ×2 IMPLANT
EVACUATOR 1/8 PVC DRAIN (DRAIN) ×2 IMPLANT
GAUZE 4X4 16PLY ~~LOC~~+RFID DBL (SPONGE) IMPLANT
GLOVE BIO SURGEON STRL SZ7 (GLOVE) IMPLANT
GLOVE BIO SURGEON STRL SZ8 (GLOVE) ×4 IMPLANT
GLOVE BIOGEL PI IND STRL 7.0 (GLOVE) IMPLANT
GOWN STRL REUS W/ TWL LRG LVL3 (GOWN DISPOSABLE) IMPLANT
GOWN STRL REUS W/ TWL XL LVL3 (GOWN DISPOSABLE) ×4 IMPLANT
GOWN STRL REUS W/TWL 2XL LVL3 (GOWN DISPOSABLE) IMPLANT
HEMOSTAT POWDER KIT SURGIFOAM (HEMOSTASIS) ×2 IMPLANT
KIT BASIN OR (CUSTOM PROCEDURE TRAY) ×2 IMPLANT
KIT INFUSE X SMALL 1.4CC (Orthopedic Implant) IMPLANT
KIT TURNOVER KIT B (KITS) ×2 IMPLANT
MILL BONE PREP (MISCELLANEOUS) ×2 IMPLANT
NDL HYPO 25X1 1.5 SAFETY (NEEDLE) ×2 IMPLANT
PACK LAMINECTOMY NEURO (CUSTOM PROCEDURE TRAY) ×2 IMPLANT
PAD ARMBOARD POSITIONER FOAM (MISCELLANEOUS) ×6 IMPLANT
PROBE BALL TIP LLIF SAFEOP (NEUROSURGERY SUPPLIES) IMPLANT
ROD LORD LIP TI 5.5X30 (Rod) IMPLANT
ROD LORD LIPPED TI 5.5X35 (Rod) IMPLANT
SCREW CORT SHANK MOD 6.5X40 (Screw) IMPLANT
SCREW KODIAK 6.5X40 (Screw) IMPLANT
SCREW POLYAXIAL TULIP (Screw) IMPLANT
SET SCREW SPNE (Screw) IMPLANT
SOLN 0.9% NACL POUR BTL 1000ML (IV SOLUTION) ×2 IMPLANT
SOLN STERILE WATER BTL 1000 ML (IV SOLUTION) ×2 IMPLANT
SOLUTION IRRIG SURGIPHOR (IV SOLUTION) ×2 IMPLANT
SPACER IDENTITI PS 8X9X25 15D (Spacer) IMPLANT
SPONGE SURGIFOAM ABS GEL 100 (HEMOSTASIS) ×2 IMPLANT
SPONGE T-LAP 4X18 ~~LOC~~+RFID (SPONGE) IMPLANT
STRIP CLOSURE SKIN 1/2X4 (GAUZE/BANDAGES/DRESSINGS) ×2 IMPLANT
SUT VIC AB 0 CT1 18XCR BRD8 (SUTURE) ×2 IMPLANT
SUT VIC AB 2-0 CP2 18 (SUTURE) ×2 IMPLANT
SUT VIC AB 3-0 SH 8-18 (SUTURE) ×4 IMPLANT
TOWEL GREEN STERILE (TOWEL DISPOSABLE) ×2 IMPLANT
TOWEL GREEN STERILE FF (TOWEL DISPOSABLE) ×2 IMPLANT

## 2024-11-10 NOTE — H&P (Signed)
 Subjective: Patient is a 73 y.o. male admitted for recurrent radiculopathy L5-s1. Onset of symptoms was several months ago, gradually worsening since that time.  The pain is rated severe, and is located at the across the lower back and radiates to leg. The pain is described as aching and occurs all day. The symptoms have been progressive. Symptoms are exacerbated by exercise, standing, and walking for more than a few minutes. MRI or CT showed severe recurrent foraminal stenosis L5-S1   Past Medical History:  Diagnosis Date   Arthritis    right thumb   Avascular necrosis of left femoral head (HCC) 10/28/2014   Chronic kidney disease    stage 3   Dry eyes    Takes Restasis    GERD (gastroesophageal reflux disease)    Hyperlipidemia    Hypertension    Vitamin D  deficiency     Past Surgical History:  Procedure Laterality Date   BACK SURGERY  11/19/2019   L3-4 R foraminectomy, Dr. Joshua   COLONOSCOPY W/ POLYPECTOMY     ESOPHAGOGASTRODUODENOSCOPY  1990   TOTAL HIP ARTHROPLASTY Left 10/28/2014   Procedure: TOTAL HIP ARTHROPLASTY ANTERIOR APPROACH;  Surgeon: Oneil JAYSON Herald, MD;  Location: [facility] OR;  Service: Orthopedics;  Laterality: Left;    Prior to Admission medications   Medication Sig Start Date End Date Taking? Authorizing Provider  acetaminophen  (TYLENOL ) 500 MG tablet Take 1,000 mg by mouth every 6 (six) hours as needed for moderate pain (pain score 4-6).   Yes [provider]  acyclovir  (ZOVIRAX ) 800 MG tablet Take 1 tablet daily for prevention of cold sores / fever blisters Patient taking differently: Take 800 mg by mouth daily as needed (fever blisters). 12/18/23  Yes Tonita Fallow, MD  atenolol  (TENORMIN ) 50 MG tablet TAKE 1 TABLET BY MOUTH DAILY FOR BLOOD PRESSURE 04/14/23  Yes Cranford, Tonya, NP  atorvastatin  (LIPITOR) 40 MG tablet TAKE 1 TABLET BY MOUTH DAILY FOR CHOLESTEROL 12/21/23  Yes Wilkinson, Dana E, NP  cetirizine (ZYRTEC) 10 MG tablet Take 10 mg by mouth daily.    Yes [provider]  Cholecalciferol  (VITAMIN D ) 50 MCG (2000 UT) tablet Take 2,000 Units by mouth daily.   Yes [provider]  dexamethasone  (DECADRON ) 2 MG tablet Take 1/2 to 1  tab   3 x day  as directed  for allergies or back pain Patient taking differently: Take 2 mg by mouth daily as needed (pain/inflammation). Take 1/2 to 1  tab   3 x day  as directed  for allergies or back pain 12/18/23  Yes Tonita Fallow, MD  esomeprazole  (NEXIUM ) 40 MG capsule Take  1 capsule  Daily  to Prevent Heartburn & Indigestion          /       TAKE      BY      MOUTH Patient taking differently: Take 40 mg by mouth every other day. 12/15/23  Yes Tonita Fallow, MD  finasteride  (PROSCAR ) 5 MG tablet Take 1 tablet (5 mg total) by mouth daily. Patient taking differently: Take 1.25 mg by mouth daily. 07/27/18  Yes Craig Palma R, PA-C  losartan  (COZAAR ) 50 MG tablet TAKE 1 TABLET BY MOUTH DAILY 09/15/23  Yes Wilkinson, Dana E, NP  MAGNESIUM  PO Take 1 tablet by mouth daily.   Yes [provider]  MIEBO 1.338 GM/ML SOLN Place 1 drop into both eyes in the morning and at bedtime.   Yes [provider]  PERCOCET 5-325 MG tablet  Take 1 tablet by mouth daily as needed for moderate pain (pain score 4-6).   Yes [provider]  Polyethyl Glycol-Propyl Glycol (SYSTANE ULTRA OP) Place 1 drop into both eyes as needed (dry eyes).   Yes [provider]  Polyethylene Glycol 400 (BLINK TRIPLE CARE) 0.25 % SOLN Place 1 drop into both eyes as needed (dry eyes).   Yes [provider]  Propylene Glycol, PF, (SYSTANE PRO PF) 0.6 % SOLN Place 1 drop into both eyes as needed (dry eyes).   Yes [provider]  RESTASIS  0.05 % ophthalmic emulsion Place 1 drop into both eyes 2 (two) times daily.   Yes [provider]  zinc gluconate 50 MG tablet Take 50 mg by mouth daily.   Yes [provider]  azithromycin  (ZITHROMAX ) 250 MG tablet Take 2 tablets on   Day 1,  followed by 1 tablet  daily for 4 more days    for Sinusitis  /Bronchitis Patient not taking: Reported on 10/28/2024 01/07/24   Laurice President, NP  montelukast  (SINGULAIR ) 10 MG tablet Take 1 tablet daily for Allergies & Asthma Patient not taking: Reported on 10/28/2024 12/18/23   Tonita Fallow, MD   Allergies  Allergen Reactions   Nsaids     Renal Insufficiency    Social History   Tobacco Use   Smoking status: Never   Smokeless tobacco: Never  Substance Use Topics   Alcohol use: Yes    Alcohol/week: 2.0 standard drinks of alcohol    Types: 2 Cans of beer per week    Comment: 2 beers /day    Family History  Problem Relation Age of Onset   Osteoporosis Mother    Schizophrenia Mother    Heart disease Father    COPD Father    Diabetes Brother    Colon cancer Neg Hx    Rectal cancer Neg Hx    Stomach cancer Neg Hx    Colon polyps Neg Hx    Esophageal cancer Neg Hx      Review of Systems  Positive ROS: neg  All other systems have been reviewed and were otherwise negative with the exception of those mentioned in the HPI and as above.  Objective: Vital signs in last 24 hours: Temp:  [97.7 F (36.5 C)] 97.7 F (36.5 C) (12/03 0651) Pulse Rate:  [65] 65 (12/03 0651) Resp:  [18] 18 (12/03 0651) BP: (155)/(82) 155/82 (12/03 0651) SpO2:  [95 %] 95 % (12/03 0651) Weight:  [68 kg] 68 kg (12/03 0651)  General Appearance: Alert, cooperative, no distress, appears stated age Head: Normocephalic, without obvious abnormality, atraumatic Eyes: PERRL, conjunctiva/corneas clear, EOM's intact    Neck: Supple, symmetrical, trachea midline Back: Symmetric, no curvature, ROM normal, no CVA tenderness Lungs:  respirations unlabored Heart: Regular rate and rhythm Abdomen: Soft, non-tender Extremities: Extremities normal, atraumatic, no cyanosis or edema Pulses: 2+ and symmetric all extremities Skin: Skin color, texture, turgor normal, no rashes or lesions  NEUROLOGIC:    Mental status: Alert and oriented x4,  no aphasia, good attention span, fund of knowledge, and memory Motor Exam - grossly normal Sensory Exam - grossly normal Reflexes: 1+ Coordination - grossly normal Gait - grossly normal Balance - grossly normal Cranial Nerves: I: smell Not tested  II: visual acuity  OS: nl    OD: nl  II: visual fields Full to confrontation  II: pupils Equal, round, reactive to light  III,VII: ptosis None  III,IV,VI: extraocular muscles  Full ROM  V: mastication Normal  V: facial light touch sensation  Normal  V,VII: corneal reflex  Present  VII: facial muscle function - upper  Normal  VII: facial muscle function - lower Normal  VIII: hearing Not tested  IX: soft palate elevation  Normal  IX,X: gag reflex Present  XI: trapezius strength  5/5  XI: sternocleidomastoid strength 5/5  XI: neck flexion strength  5/5  XII: tongue strength  Normal    Data Review Lab Results  Component Value Date   WBC 10.4 11/02/2024   HGB 16.0 11/02/2024   HCT 45.5 11/02/2024   MCV 98.7 11/02/2024   PLT 200 11/02/2024   Lab Results  Component Value Date   NA 134 (L) 11/02/2024   K 3.5 11/02/2024   CL 97 (L) 11/02/2024   CO2 27 11/02/2024   BUN 20 11/02/2024   CREATININE 1.13 11/02/2024   GLUCOSE 90 11/02/2024   Lab Results  Component Value Date   INR 1.04 10/21/2014    Assessment/Plan:  Estimated body mass index is 23.49 kg/m as calculated from the following:   Height as of this encounter: 5' 7 (1.702 m).   Weight as of this encounter: 68 kg. Patient admitted for PLIF L5-S1. Patient has failed a reasonable attempt at conservative therapy.  I explained the condition and procedure to the patient and answered any questions.  Patient wishes to proceed with procedure as planned. Understands risks/ benefits and typical outcomes of procedure.   Alm GORMAN Molt 11/10/2024 7:38 AM

## 2024-11-10 NOTE — Transfer of Care (Signed)
 Immediate Anesthesia Transfer of Care Note  Patient: [name] [name]  Procedure(s) Performed: Posterior Lateral Interbody Fusion - Lumbar Five-Sacral One - Posterior Lateral and Interbody fusion (Back)  Patient Location: PACU  Anesthesia Type:General  Level of Consciousness: awake and alert   Airway & Oxygen Therapy: Patient Spontanous Breathing and Patient connected to face mask oxygen  Post-op Assessment: Report given to RN and Post -op Vital signs reviewed and stable  Post vital signs: Reviewed and stable  Last Vitals:  Vitals Value Taken Time  BP 133/62 11/10/24 11:35  Temp    Pulse 69 11/10/24 11:38  Resp 24 11/10/24 11:38  SpO2 93 % 11/10/24 11:38  Vitals shown include unfiled device data.  Last Pain:  Vitals:   11/10/24 9287  TempSrc:   PainSc: 0-No pain      Patients Stated Pain Goal: 0 (11/10/24 9287)  Complications: No notable events documented.

## 2024-11-10 NOTE — Plan of Care (Signed)

## 2024-11-10 NOTE — Anesthesia Postprocedure Evaluation (Signed)
 Anesthesia Post Note  Patient: [name] [name]  Procedure(s) Performed: Posterior Lateral Interbody Fusion - Lumbar Five-Sacral One - Posterior Lateral and Interbody fusion (Back)     Patient location during evaluation: PACU Anesthesia Type: General Level of consciousness: awake and alert, patient cooperative and oriented Pain control: pain improving. Vital Signs Assessment: post-procedure vital signs reviewed and stable Respiratory status: spontaneous breathing, nonlabored ventilation and respiratory function stable Cardiovascular status: blood pressure returned to baseline and stable Postop Assessment: no apparent nausea or vomiting Anesthetic complications: no   No notable events documented.  Last Vitals:  Vitals:   11/10/24 1330 11/10/24 1405  BP: (!) 97/42 133/60  Pulse: 61 (!) 58  Resp: 15 18  Temp:  (!) 36.4 C  SpO2: 98% 96%    Last Pain:  Vitals:   11/10/24 1403  TempSrc:   PainSc: 9                  Athanasia Stanwood,E. Alycia Cooperwood

## 2024-11-10 NOTE — Anesthesia Procedure Notes (Signed)
 Procedure Name: Intubation Date/Time: 11/10/2024 8:37 AM  Performed by: Julien Manus, CRNAPre-anesthesia Checklist: Patient identified, Emergency Drugs available, Suction available and Patient being monitored Patient Re-evaluated:Patient Re-evaluated prior to induction Oxygen Delivery Method: Circle System Utilized Preoxygenation: Pre-oxygenation with 100% oxygen Induction Type: IV induction Ventilation: Mask ventilation without difficulty Laryngoscope Size: Mac and 4 Grade View: Grade I Tube type: Oral Tube size: 7.5 mm Number of attempts: 1 Airway Equipment and Method: Stylet and Oral airway Placement Confirmation: ETT inserted through vocal cords under direct vision, positive ETCO2 and breath sounds checked- equal and bilateral Secured at: 22 cm Tube secured with: Tape Dental Injury: Teeth and Oropharynx as per pre-operative assessment

## 2024-11-10 NOTE — Progress Notes (Signed)
 Patient ID: [name] [name], male   DOB: [birth year], 73 y.o.   MRN: [id number] Doing well post-op, back feels compressed. No leg pain or NT, but his L DF seem mildly weak. Eating well.

## 2024-11-10 NOTE — Op Note (Signed)
 11/10/2024  11:32 AM  PATIENT:  [name] [name]  73 y.o. male  PRE-OPERATIVE DIAGNOSIS: Spondylolisthesis with severe foraminal stenosis L5-S1, previous L5-S1 decompressive hemilaminectomy on the right, right L5 radiculopathy  POST-OPERATIVE DIAGNOSIS:  same  PROCEDURE:   1. Decompressive lumbar laminectomy, hemi facetectomy and foraminotomies L5-S1 requiring more work than would be required for a simple exposure of the disk for PLIF in order to adequately decompress the neural elements and address the spinal stenosis 2. Posterior lumbar interbody fusion L5-S1 using PTI interbody cages packed with morcellized allograft and autograft  3. Posterior fixation L5-S1 using ATEC cortical pedicle screws.  4. Intertransverse arthrodesis L5-S1 using morcellized autograft and allograft.  SURGEON:  Alm Molt, MD  ASSISTANTSBETHA Pean FNP  ANESTHESIA:  General  EBL: 200 ml  Total I/O In: 100 [IV Piggyback:100] Out: 200 [Blood:200]  BLOOD ADMINISTERED:none  DRAINS: none   INDICATION FOR PROCEDURE: This patient presented with back and right leg pain. Imaging revealed postlaminectomy spondylolisthesis with recurrent severe right foraminal stenosis L5-S1 with compression of the right L5 nerve root.  He had diagnostic L5 nerve root blocks.  The patient tried a reasonable attempt at conservative medical measures without relief. I recommended decompression and instrumented fusion to address the stenosis as well as the segmental  instability.  Patient understood the risks, benefits, and alternatives and potential outcomes and wished to proceed.  PROCEDURE DETAILS:  The patient was brought to the operating room. After induction of generalized endotracheal anesthesia the patient was rolled into the prone position on chest rolls and all pressure points were padded. The patient's lumbar region was cleaned and then prepped with DuraPrep and draped in the usual sterile fashion. Anesthesia was injected and  then a dorsal midline incision was made and carried down to the lumbosacral fascia. The fascia was opened and the paraspinous musculature was taken down in a subperiosteal fashion to expose L5-S1. A self-retaining retractor was placed. Intraoperative fluoroscopy confirmed my level, and I started with placement of the L5 cortical pedicle screws. The pedicle screw entry zones were identified utilizing surface landmarks and  AP and lateral fluoroscopy. I scored the cortex with the high-speed drill and then used the hand drill to drill an upward and outward direction into the pedicle. I then tapped line to line. I then placed a 6.5 x 40 mm cortical pedicle screw into the pedicles of L5 bilaterally.    I then turned my attention to the decompression and complete lumbar laminectomies, hemi- facetectomies, and foraminotomies were performed at L5-S1.  My nurse practitioner was directly involved in the decompression and exposure of the neural elements. the patient had significant spinal stenosis and this required more work than would be required for a simple exposure of the disc for posterior lumbar interbody fusion which would only require a limited laminotomy. Much more generous decompression and generous foraminotomy was undertaken in order to adequately decompress the neural elements and address the patient's leg pain. The yellow ligament was removed to expose the underlying dura and nerve roots, and generous foraminotomies were performed to adequately decompress the neural elements. Both the exiting and traversing nerve roots were decompressed on both sides until a coronary dilator passed easily along the nerve roots. Once the decompression was complete, I turned my attention to the posterior lower lumbar interbody fusion. The epidural venous vasculature was coagulated and cut sharply. Disc space was incised and the initial discectomy was performed with pituitary rongeurs. The disc space was distracted with sequential  distractors to  a height of 9 mm. We then used a series of scrapers and shavers to prepare the endplates for fusion. The midline was prepared with Epstein curettes. Once the complete discectomy was finished, we packed a 8 mm 15 degree interbody cage with local autograft and morcellized allograft, gently retracted the nerve root, and tapped the cage into position at L5-S1.  The midline between the cages was packed with morselized autograft and allograft.   We then turned our attention to the placement of the lower pedicle screws. The pedicle screw entry zones were identified utilizing surface landmarks and fluoroscopy. I drilled into each pedicle utilizing the hand drill, and tapped each pedicle with the appropriate tap. We palpated with a ball probe to assure no break in the cortex. We then placed 6.5 x 40 mm pedicle screws into the pedicles bilaterally at S1.  My nurse practitioner assisted in placement of the pedicle screws.  We then decorticated the transverse processes and laid a mixture of morcellized autograft and allograft out over these to perform intertransverse arthrodesis at L5-S1. We then placed lordotic rods into the multiaxial screw heads of the pedicle screws and locked these in position with the locking caps and anti-torque device. We then checked our construct with AP and lateral fluoroscopy. Irrigated with copious amounts of 0.5% povidone iodine solution followed by saline solution. Inspected the nerve roots once again to assure adequate decompression, lined to the dura with Gelfoam,  and then we closed the muscle and the fascia with 0 Vicryl. Closed the subcutaneous tissues with 2-0 Vicryl and subcuticular tissues with 3-0 Vicryl. The skin was closed with benzoin and Steri-Strips. Dressing was then applied, the patient was awakened from general anesthesia and transported to the recovery room in stable condition. At the end of the procedure all sponge, needle and instrument counts were correct.    PLAN OF CARE: admit to inpatient  PATIENT DISPOSITION:  PACU - hemodynamically stable.   Delay start of Pharmacological VTE agent (>24hrs) due to surgical blood loss or risk of bleeding:  yes

## 2024-11-11 MED ORDER — METHOCARBAMOL 500 MG PO TABS: 500.0000 mg | ORAL_TABLET | Freq: Three times a day (TID) | ORAL | 1 refills | Status: AC | PRN

## 2024-11-11 MED ORDER — OXYCODONE-ACETAMINOPHEN 7.5-325 MG PO TABS: 1.0000 | ORAL_TABLET | ORAL | 0 refills | Status: AC | PRN

## 2024-11-11 MED ORDER — OXYCODONE-ACETAMINOPHEN 7.5-325 MG PO TABS
1.0000 | ORAL_TABLET | ORAL | Status: DC | PRN
  Administered 2024-11-11: 1 via ORAL
  Filled 2024-11-11: qty 1

## 2024-11-11 NOTE — Evaluation (Addendum)
 Physical Therapy Evaluation Patient Details Name: [name] [name] MRN: [id number] DOB: 29-Nov-1951 Today's Date: 11/11/2024  History of Present Illness  73 y.o. male presents to [facility] 11/10/24 for elective PLIF L5-S1. PMHx: CKD, HTN  Clinical Impression  PTA pt was independent for mobility with no AD. Educated pt on spinal precautions, brace wearing schedule, car transfers, stair negotiation technique, and walking program. Pt presents with L knee pain and limited L DF AROM. Pt was ModI for bed mobility, transfers, and gait with use of RW. Pt was also to don/doff LSO with slight verbal cueing. Pt will have 24/7 assist upon d/c home. Discussed following up with MD at follow-up visit regarding need for OP PT to address functional deficits. Pt has no further questions/concerns and feels comfortable d/c home. Acute PT signing off. Please re-consult if new needs arise.         If plan is discharge home, recommend the following: Assist for transportation;Help with stairs or ramp for entrance   Can travel by private vehicle    Yes    Equipment Recommendations Rolling walker (2 wheels)     Functional Status Assessment Patient has had a recent decline in their functional status and demonstrates the ability to make significant improvements in function in a reasonable and predictable amount of time.     Precautions / Restrictions Precautions Precautions: Fall;Back Precaution Booklet Issued: Yes (comment) Recall of Precautions/Restrictions: Intact Required Braces or Orthoses: Spinal Brace Spinal Brace: Lumbar corset;Applied in sitting position Restrictions Weight Bearing Restrictions Per Provider Order: No      Mobility  Bed Mobility Overal bed mobility: Modified Independent    General bed mobility comments: able to recall log roll technique with no cueing    Transfers Overall transfer level: Modified independent Equipment used: Rolling walker (2 wheels)    General transfer comment:  cues for hand placement when returning to seated position    Ambulation/Gait Ambulation/Gait assistance: Modified independent (Device/Increase time) Gait Distance (Feet): 200 Feet Assistive device: Rolling walker (2 wheels) Gait Pattern/deviations: Step-through pattern, Decreased stride length Gait velocity: decr     General Gait Details: steady gait with reliance on UE support  Stairs Stairs:  (verbally reviewed stair negotiation technique)      Balance Overall balance assessment: Needs assistance Sitting-balance support: No upper extremity supported, Feet supported Sitting balance-Leahy Scale: Good     Standing balance support: Bilateral upper extremity supported, During functional activity, Reliant on assistive device for balance Standing balance-Leahy Scale: Poor Standing balance comment: reliant on UE support         Pertinent Vitals/Pain Pain Assessment Pain Assessment: Faces Faces Pain Scale: Hurts even more Pain Location: L knee Pain Descriptors / Indicators: Aching, Discomfort Pain Intervention(s): Limited activity within patient's tolerance, Monitored during session, Repositioned    Home Living Family/patient expects to be discharged to:: Private residence Living Arrangements: Spouse/significant other Available Help at Discharge: Family;Available 24 hours/day Type of Home: House Home Access: Stairs to enter Entrance Stairs-Rails: None Entrance Stairs-Number of Steps: 1   Home Layout: One level Home Equipment: Shower seat      Prior Function Prior Level of Function : Independent/Modified Independent;Driving      Mobility Comments: Ind with no AD ADLs Comments: Ind     Extremity/Trunk Assessment   Upper Extremity Assessment Upper Extremity Assessment: Defer to OT evaluation    Lower Extremity Assessment Lower Extremity Assessment: RLE deficits/detail;LLE deficits/detail RLE Deficits / Details: Hip flexion 4+/5, Knee ext 5/5, Ankle DF 5/5 RLE  Sensation:  WNL LLE Deficits / Details: Hip flexion 4+/5, Knee ext at least 3/5 (unable to MMT due to knee pain), limited ankle DF AROM LLE Sensation: WNL    Cervical / Trunk Assessment Cervical / Trunk Assessment: Back Surgery  Communication   Communication Communication: No apparent difficulties    Cognition Arousal: Alert Behavior During Therapy: WFL for tasks assessed/performed   PT - Cognitive impairments: No apparent impairments    Following commands: Intact       Cueing Cueing Techniques: Verbal cues, Tactile cues, Visual cues     General Comments General comments (skin integrity, edema, etc.): VSS on RA     PT Assessment Patient does not need any further PT services         PT Goals (Current goals can be found in the Care Plan section)  Acute Rehab PT Goals PT Goal Formulation: All assessment and education complete, DC therapy     AM-PAC PT 6 Clicks Mobility  Outcome Measure Help needed turning from your back to your side while in a flat bed without using bedrails?: None Help needed moving from lying on your back to sitting on the side of a flat bed without using bedrails?: None Help needed moving to and from a bed to a chair (including a wheelchair)?: None Help needed standing up from a chair using your arms (e.g., wheelchair or bedside chair)?: None Help needed to walk in hospital room?: None Help needed climbing 3-5 steps with a railing? : A Little 6 Click Score: 23    End of Session Equipment Utilized During Treatment: Back brace Activity Tolerance: Patient tolerated treatment well Patient left: in bed;with call bell/phone within reach Nurse Communication: Mobility status PT Visit Diagnosis: Other abnormalities of gait and mobility (R26.89);Muscle weakness (generalized) (M62.81)    Time: 9241-9185 PT Time Calculation (min) (ACUTE ONLY): 16 min   Charges:   PT Evaluation $PT Eval Low Complexity: 1 Low   PT General Charges $$ ACUTE PT VISIT: 1  Visit        Kate ORN, PT, DPT Secure Chat Preferred  Rehab Office [phone]   Kate BRAVO Wendolyn 11/11/2024, 8:24 AM

## 2024-11-11 NOTE — Progress Notes (Signed)
 Patient awaiting family for discharge home, Patient in no acute distress nor complaints of pain nor discomfort; incision on back is clean, dry and intact; No c/o pain at this time. Room was checked and accounted for all patient's belongings; discharge instructions concerning her medications, incision care, follow up appointment and when to call the doctor as needed were all discussed with patient by RN and he expressed understanding on the instructions given.

## 2024-11-11 NOTE — Discharge Summary (Signed)
 Physician Discharge Summary  Patient ID: [name] [name] MRN: [id number] DOB/AGE: 72/03/1951 72 y.o.  Admit date: 11/10/2024 Discharge date: 11/11/2024  Admission Diagnoses: lumbar radiculopathy    Discharge Diagnoses: same   Discharged Condition: good  Hospital Course: The patient was admitted on 11/10/2024 and taken to the operating room where the patient underwent PLIF L5-s1. The patient tolerated the procedure well and was taken to the recovery room and then to the floor in stable condition. The hospital course was routine. There were no complications. The wound remained clean dry and intact. Pt had appropriate back soreness with some p[ain behind the left knee.The patient remained afebrile with stable vital signs, and tolerated a regular diet. The patient continued to increase activities, and pain was well controlled with oral pain medications.   Consults: None  Significant Diagnostic Studies:  Results for orders placed or performed during the hospital encounter of 11/10/24  Surgical pcr screen   Collection Time: 11/10/24  6:46 AM   Specimen: Nasal Mucosa; Nasal Swab  Result Value Ref Range   MRSA, PCR NEGATIVE NEGATIVE   Staphylococcus aureus NEGATIVE NEGATIVE  ABO/Rh   Collection Time: 11/10/24  9:01 AM  Result Value Ref Range   ABO/RH(D)      A NEG Performed at [facility], [address] [place], [place] [address]     DG Lumbar Spine 2-3 Views Result Date: 11/10/2024 CLINICAL DATA:  L5-S1 posterolateral and interbody fusion. EXAM: LUMBAR SPINE - 2-3 VIEW COMPARISON:  Lumbar spine CT myelogram dated 09/16/2024. FINDINGS: Two PA and 1 lateral C-arm image of the lumbosacral region demonstrate unilateral interbody and bilateral pedicle screw and rod fixation at the L5-S1 level. Stable mild grade 1 anterolisthesis at that level. IMPRESSION: L5-S1 fusion, as described above. Electronically Signed   By: Elspeth Bathe M.D.   On: 11/10/2024 15:56   DG C-Arm 1-60 Min-No  Report Result Date: 11/10/2024 Fluoroscopy was utilized by the requesting physician.  No radiographic interpretation.   DG C-Arm 1-60 Min-No Report Result Date: 11/10/2024 Fluoroscopy was utilized by the requesting physician.  No radiographic interpretation.    Antibiotics:  Anti-infectives (From admission, onward)    Start     Dose/Rate Route Frequency Ordered Stop   11/10/24 1700  ceFAZolin  (ANCEF ) IVPB 2g/100 mL premix        2 g 200 mL/hr over 30 Minutes Intravenous Every 8 hours 11/10/24 1317 11/11/24 0132   11/10/24 0700  ceFAZolin  (ANCEF ) IVPB 2g/100 mL premix        2 g 200 mL/hr over 30 Minutes Intravenous On call to O.R. 11/10/24 0645 11/10/24 0847       Discharge Exam: Blood pressure 135/82, pulse 64, temperature 98 F (36.7 C), temperature source Oral, resp. rate 18, height 5' 7 (1.702 m), weight 68 kg, SpO2 98%. Neurologic: Grossly normal with mild L DF weakness Dressing dry  Discharge Medications:   Allergies as of 11/11/2024       Reactions   Nsaids    Renal Insufficiency        Medication List     STOP taking these medications    Percocet 5-325 MG tablet Generic drug: oxyCODONE -acetaminophen  Replaced by: oxyCODONE -acetaminophen  7.5-325 MG tablet       TAKE these medications    acetaminophen  500 MG tablet Commonly known as: TYLENOL  Take 1,000 mg by mouth every 6 (six) hours as needed for moderate pain (pain score 4-6).   acyclovir  800 MG tablet Commonly known as: ZOVIRAX  Take 1  tablet daily for prevention of cold sores / fever blisters What changed:  how much to take how to take this when to take this reasons to take this additional instructions   atenolol  50 MG tablet Commonly known as: TENORMIN  TAKE 1 TABLET BY MOUTH DAILY FOR BLOOD PRESSURE   atorvastatin  40 MG tablet Commonly known as: LIPITOR TAKE 1 TABLET BY MOUTH DAILY FOR CHOLESTEROL   azithromycin  250 MG tablet Commonly known as: Zithromax  Take 2 tablets on  Day 1,   followed by 1 tablet  daily for 4 more days    for Sinusitis  /Bronchitis   Blink Triple Care 0.25 % Soln Generic drug: Polyethylene Glycol 400 Place 1 drop into both eyes as needed (dry eyes).   cetirizine 10 MG tablet Commonly known as: ZYRTEC Take 10 mg by mouth daily.   dexamethasone  2 MG tablet Commonly known as: DECADRON  Take 1/2 to 1  tab   3 x day  as directed  for allergies or back pain What changed:  how much to take how to take this when to take this reasons to take this   esomeprazole  40 MG capsule Commonly known as: NEXIUM  Take  1 capsule  Daily  to Prevent Heartburn & Indigestion          /       TAKE      BY      MOUTH What changed:  how much to take how to take this when to take this additional instructions   finasteride  5 MG tablet Commonly known as: PROSCAR  Take 1 tablet (5 mg total) by mouth daily. What changed: how much to take   losartan  50 MG tablet Commonly known as: COZAAR  TAKE 1 TABLET BY MOUTH DAILY   MAGNESIUM  PO Take 1 tablet by mouth daily.   methocarbamol  500 MG tablet Commonly known as: ROBAXIN  Take 1 tablet (500 mg total) by mouth every 8 (eight) hours as needed for muscle spasms.   Miebo 1.338 GM/ML Soln Generic drug: Perfluorohexyloctane Place 1 drop into both eyes in the morning and at bedtime.   montelukast  10 MG tablet Commonly known as: SINGULAIR  Take 1 tablet daily for Allergies & Asthma   oxyCODONE -acetaminophen  7.5-325 MG tablet Commonly known as: PERCOCET Take 1 tablet by mouth every 4 (four) hours as needed for moderate pain (pain score 4-6). Replaces: Percocet 5-325 MG tablet   Restasis  0.05 % ophthalmic emulsion Generic drug: cycloSPORINE  Place 1 drop into both eyes 2 (two) times daily.   Systane Pro PF 0.6 % Soln Generic drug: Propylene Glycol (PF) Place 1 drop into both eyes as needed (dry eyes).   SYSTANE ULTRA OP Place 1 drop into both eyes as needed (dry eyes).   Vitamin D  50 MCG (2000 UT)  tablet Take 2,000 Units by mouth daily.   zinc gluconate 50 MG tablet Take 50 mg by mouth daily.               Durable Medical Equipment  (From admission, onward)           Start     Ordered   11/10/24 1318  DME Walker rolling  Once       Question:  Patient needs a walker to treat with the following condition  Answer:  S/P lumbar fusion   11/10/24 1317   11/10/24 1318  DME 3 n 1  Once        11/10/24 1317  Disposition: home   Final Dx: PLIF L5-s1  Discharge Instructions      Remove dressing in 72 hours   Complete by: As directed    Call MD for:  difficulty breathing, headache or visual disturbances   Complete by: As directed    Call MD for:  persistant nausea and vomiting   Complete by: As directed    Call MD for:  redness, tenderness, or signs of infection (pain, swelling, redness, odor or green/yellow discharge around incision site)   Complete by: As directed    Call MD for:  severe uncontrolled pain   Complete by: As directed    Call MD for:  temperature >100.4   Complete by: As directed    Diet - low sodium heart healthy   Complete by: As directed    Increase activity slowly   Complete by: As directed           Signed: Alm GORMAN Molt 11/11/2024, 7:42 AM

## 2024-11-11 NOTE — Progress Notes (Signed)
 Occupational Therapy Evaluation Patient Details Name: [name] [name] MRN: [id number] DOB: 31-Aug-1951 Today's Date: 11/11/2024   History of Present Illness   73 y.o. male [facility]10[facility] for elective PLIF L5-S1. PMHx: CKD, HTN     Clinical Impressions [name] was evaluated s/p the above spine surgery. He is indep and lives with family at baseline. Upon evaluation pt was limited by surgical pain, spinal precautions, compensatory techniques and decreased activity tolerance. Overall he demonstrated mod I ability to complete ADLs and functional mobility with RW and back brace. Provided cues and education on spinal precautions and compensatory techniques throughout, handout provided and pt demonstrated great recall during ADLs and mobility. Pt does not require further acute OT services. Recommend d/c home with support of family.       If plan is discharge home, recommend the following:   Assistance with cooking/housework;Assist for transportation     Functional Status Assessment   Patient has had a recent decline in their functional status and demonstrates the ability to make significant improvements in function in a reasonable and predictable amount of time.     Equipment Recommendations   Other (comment) (RW)      Precautions/Restrictions   Precautions Precautions: Fall;Back Precaution Booklet Issued: Yes (comment) Recall of Precautions/Restrictions: Intact Required Braces or Orthoses: Spinal Brace Spinal Brace: Lumbar corset;Applied in sitting position Restrictions Weight Bearing Restrictions Per Provider Order: No     Mobility Bed Mobility Overal bed mobility: Modified Independent             General bed mobility comments: able to recall log roll technique with no cueing    Transfers Overall transfer level: Modified independent Equipment used: Rolling walker (2 wheels)               General transfer comment: cues for hand placement when  returning to seated position      Balance Overall balance assessment: Needs assistance Sitting-balance support: No upper extremity supported, Feet supported Sitting balance-Leahy Scale: Good     Standing balance support: Bilateral upper extremity supported, During functional activity, Reliant on assistive device for balance Standing balance-Leahy Scale: Poor Standing balance comment: reliant on UE support                           ADL either performed or assessed with clinical judgement   ADL Overall ADL's : Modified independent     General ADL Comments: mod I after review of spinal precautions and compensatory techniques. Pt is able to get into figure four position bilaterally.     Vision Baseline Vision/History: 0 No visual deficits Vision Assessment?: No apparent visual deficits     Perception Perception: Within Functional Limits       Praxis Praxis: WFL       Pertinent Vitals/Pain Pain Assessment Pain Assessment: Faces Faces Pain Scale: Hurts even more Pain Location: L knee Pain Descriptors / Indicators: Aching, Discomfort Pain Intervention(s): Monitored during session     Extremity/Trunk Assessment Upper Extremity Assessment Upper Extremity Assessment: Overall WFL for tasks assessed   Lower Extremity Assessment Lower Extremity Assessment: Defer to PT evaluation RLE Deficits / Details: Hip flexion 4+/5, Knee ext 5/5, Ankle DF 5/5 RLE Sensation: WNL LLE Deficits / Details: Hip flexion 4+/5, Knee ext at least 3/5 (unable to MMT due to knee pain), limited ankle DF AROM LLE Sensation: WNL   Cervical / Trunk Assessment Cervical / Trunk Assessment: Back Surgery   Communication Communication Communication:  No apparent difficulties   Cognition Arousal: Alert Behavior During Therapy: WFL for tasks assessed/performed Cognition: No apparent impairments         Following commands: Intact       Cueing  General Comments   Cueing Techniques:  Verbal cues;Tactile cues;Visual cues  VSS on RA           Home Living Family/patient expects to be discharged to:: Private residence Living Arrangements: Spouse/significant other Available Help at Discharge: Family;Available 24 hours/day Type of Home: House Home Access: Stairs to enter [employer] of Steps: 1 Entrance Stairs-Rails: None Home Layout: One level     Bathroom Shower/Tub: Walk-in shower         Home Equipment: Shower seat          Prior Functioning/Environment Prior Level of Function : Independent/Modified Independent;Driving             Mobility Comments: Ind with no AD ADLs Comments: Ind    OT Problem List: Decreased activity tolerance;Decreased knowledge of precautions        OT Goals(Current goals can be found in the care plan section)   Acute Rehab OT Goals Patient Stated Goal: home OT Goal Formulation: With patient Time For Goal Achievement: 11/11/24 Potential to Achieve Goals: Good   AM-PAC OT 6 Clicks Daily Activity     Outcome Measure Help from another person eating meals?: None Help from another person taking care of personal grooming?: None Help from another person toileting, which includes using toliet, bedpan, or urinal?: None Help from another person bathing (including washing, rinsing, drying)?: None Help from another person to put on and taking off regular upper body clothing?: None Help from another person to put on and taking off regular lower body clothing?: None 6 Click Score: 24   End of Session Equipment Utilized During Treatment: Rolling walker (2 wheels);Back brace Nurse Communication: Mobility status  Activity Tolerance: Patient tolerated treatment well Patient left: in bed;with call bell/phone within reach  OT Visit Diagnosis: Other abnormalities of gait and mobility (R26.89)                Time: 9164-9144 OT Time Calculation (min): 20 min Charges:  OT General Charges $OT Visit: 1 Visit OT  Evaluation $OT Eval Low Complexity: 1 Low  Lucie Kendall, OTR/L Acute Rehabilitation Services Office [phone] Secure Chat Communication Preferred   Lucie JONETTA Kendall 11/11/2024, 11:29 AM

## 2024-12-08 ENCOUNTER — Other Ambulatory Visit (HOSPITAL_BASED_OUTPATIENT_CLINIC_OR_DEPARTMENT_OTHER): Payer: Self-pay | Admitting: Neurological Surgery

## 2024-12-08 ENCOUNTER — Other Ambulatory Visit: Payer: Self-pay | Admitting: Neurological Surgery

## 2024-12-08 DIAGNOSIS — M5417 Radiculopathy, lumbosacral region: Secondary | ICD-10-CM

## 2024-12-10 ENCOUNTER — Ambulatory Visit
Admission: RE | Admit: 2024-12-10 | Discharge: 2024-12-10 | Disposition: A | Discharge status: Home / Self Care | Source: Ambulatory Visit | Attending: Neurological Surgery | Admitting: Neurological Surgery

## 2024-12-10 DIAGNOSIS — M5417 Radiculopathy, lumbosacral region: Secondary | ICD-10-CM

## 2024-12-10 MED ORDER — GADOPICLENOL 0.5 MMOL/ML IV SOLN
7.0000 mL | Freq: Once | INTRAVENOUS | Status: AC | PRN
  Administered 2024-12-10: 7 mL via INTRAVENOUS

## 2024-12-21 ENCOUNTER — Other Ambulatory Visit

## 2024-12-28 ENCOUNTER — Encounter: Payer: PPO | Admitting: Internal Medicine

## 2025-01-03 ENCOUNTER — Encounter: Payer: PPO | Admitting: Internal Medicine
